# Patient Record
Sex: Male | Born: 1945 | Race: White | Hispanic: No | Marital: Married | Smoking: Never smoker
Health system: Southern US, Community
[De-identification: ages and names within clinical notes are randomized; demographics above are authoritative.]

## PROBLEM LIST (undated history)

## (undated) DIAGNOSIS — I2510 Atherosclerotic heart disease of native coronary artery without angina pectoris: Secondary | ICD-10-CM

## (undated) DIAGNOSIS — E119 Type 2 diabetes mellitus without complications: Secondary | ICD-10-CM

## (undated) DIAGNOSIS — I639 Cerebral infarction, unspecified: Secondary | ICD-10-CM

## (undated) DIAGNOSIS — N182 Chronic kidney disease, stage 2 (mild): Secondary | ICD-10-CM

## (undated) DIAGNOSIS — I2699 Other pulmonary embolism without acute cor pulmonale: Secondary | ICD-10-CM

## (undated) DIAGNOSIS — I219 Acute myocardial infarction, unspecified: Secondary | ICD-10-CM

## (undated) HISTORY — PX: OTHER SURGICAL HISTORY: SHX169

## (undated) HISTORY — PX: APPENDECTOMY: SHX54

## (undated) HISTORY — DX: Acute myocardial infarction, unspecified: I21.9

## (undated) HISTORY — DX: Chronic kidney disease, stage 2 (mild): N18.2

## (undated) HISTORY — DX: Cerebral infarction, unspecified: I63.9

## (undated) HISTORY — DX: Type 2 diabetes mellitus without complications: E11.9

## (undated) HISTORY — PX: CATARACT EXTRACTION: SUR2

## (undated) HISTORY — PX: CERVICAL SPINE SURGERY: SHX589

## (undated) HISTORY — DX: Other pulmonary embolism without acute cor pulmonale: I26.99

## (undated) HISTORY — DX: Atherosclerotic heart disease of native coronary artery without angina pectoris: I25.10

---

## 2002-01-07 ENCOUNTER — Ambulatory Visit: Admission: RE | Admit: 2002-01-07 | Discharge: 2002-01-07 | Payer: Self-pay | Admitting: Neurosurgery

## 2002-03-06 ENCOUNTER — Inpatient Hospital Stay (HOSPITAL_COMMUNITY)
Admission: RE | Admit: 2002-03-06 | Discharge: 2002-03-22 | Payer: Self-pay | Admitting: Physical Medicine & Rehabilitation

## 2002-03-12 ENCOUNTER — Encounter: Payer: Self-pay | Admitting: Physical Medicine & Rehabilitation

## 2002-03-22 ENCOUNTER — Inpatient Hospital Stay (HOSPITAL_COMMUNITY): Admission: AD | Admit: 2002-03-22 | Discharge: 2002-03-27 | Payer: Self-pay | Admitting: Cardiology

## 2002-03-23 ENCOUNTER — Encounter: Payer: Self-pay | Admitting: Cardiology

## 2002-04-06 ENCOUNTER — Inpatient Hospital Stay (HOSPITAL_COMMUNITY): Admission: EM | Admit: 2002-04-06 | Discharge: 2002-04-10 | Payer: Self-pay

## 2002-07-18 ENCOUNTER — Encounter: Payer: Self-pay | Admitting: Emergency Medicine

## 2002-07-18 ENCOUNTER — Emergency Department (HOSPITAL_COMMUNITY): Admission: EM | Admit: 2002-07-18 | Discharge: 2002-07-18 | Payer: Self-pay | Admitting: Internal Medicine

## 2002-08-28 ENCOUNTER — Encounter: Payer: Self-pay | Admitting: Neurosurgery

## 2002-09-02 ENCOUNTER — Encounter: Payer: Self-pay | Admitting: Neurosurgery

## 2002-09-02 ENCOUNTER — Inpatient Hospital Stay (HOSPITAL_COMMUNITY): Admission: RE | Admit: 2002-09-02 | Discharge: 2002-09-08 | Payer: Self-pay | Admitting: Neurosurgery

## 2002-09-05 ENCOUNTER — Encounter: Payer: Self-pay | Admitting: Neurosurgery

## 2002-09-26 ENCOUNTER — Encounter: Payer: Self-pay | Admitting: Emergency Medicine

## 2002-09-26 ENCOUNTER — Observation Stay (HOSPITAL_COMMUNITY): Admission: EM | Admit: 2002-09-26 | Discharge: 2002-09-27 | Payer: Self-pay | Admitting: Emergency Medicine

## 2002-11-16 ENCOUNTER — Encounter (HOSPITAL_COMMUNITY): Admission: RE | Admit: 2002-11-16 | Discharge: 2003-02-14 | Payer: Self-pay | Admitting: Cardiology

## 2003-02-15 ENCOUNTER — Encounter (HOSPITAL_COMMUNITY): Admission: RE | Admit: 2003-02-15 | Discharge: 2003-03-29 | Payer: Self-pay | Admitting: Cardiology

## 2003-12-21 ENCOUNTER — Ambulatory Visit: Payer: Self-pay | Admitting: Unknown Physician Specialty

## 2004-11-09 ENCOUNTER — Ambulatory Visit: Payer: Self-pay | Admitting: Cardiology

## 2005-11-13 ENCOUNTER — Ambulatory Visit: Payer: Self-pay | Admitting: Cardiology

## 2006-06-03 ENCOUNTER — Ambulatory Visit: Payer: Self-pay | Admitting: Cardiology

## 2006-06-03 ENCOUNTER — Inpatient Hospital Stay (HOSPITAL_COMMUNITY): Admission: EM | Admit: 2006-06-03 | Discharge: 2006-06-04 | Payer: Self-pay | Admitting: Emergency Medicine

## 2006-06-11 ENCOUNTER — Ambulatory Visit: Payer: Self-pay

## 2006-11-07 ENCOUNTER — Ambulatory Visit: Payer: Self-pay | Admitting: Cardiology

## 2008-03-01 ENCOUNTER — Ambulatory Visit: Payer: Self-pay | Admitting: Cardiology

## 2008-03-01 DIAGNOSIS — I259 Chronic ischemic heart disease, unspecified: Secondary | ICD-10-CM | POA: Insufficient documentation

## 2008-03-01 DIAGNOSIS — I2510 Atherosclerotic heart disease of native coronary artery without angina pectoris: Secondary | ICD-10-CM | POA: Insufficient documentation

## 2008-03-01 DIAGNOSIS — I6350 Cerebral infarction due to unspecified occlusion or stenosis of unspecified cerebral artery: Secondary | ICD-10-CM

## 2008-09-20 ENCOUNTER — Encounter (INDEPENDENT_AMBULATORY_CARE_PROVIDER_SITE_OTHER): Payer: Self-pay | Admitting: *Deleted

## 2008-12-21 ENCOUNTER — Ambulatory Visit: Payer: Self-pay | Admitting: Cardiology

## 2009-03-21 ENCOUNTER — Telehealth: Payer: Self-pay | Admitting: Cardiology

## 2010-03-28 NOTE — Progress Notes (Signed)
Summary: refill--metoprolol   Phone Note Refill Request Message from:  Patient on March 21, 2009 11:33 AM  Refills Requested: Medication #1:  METOPROLOL TARTRATE 50 MG TABS two times a day   [facility]ed[facility] [place] [phone]   Method Requested: Fax to Local Pharmacy Initial call taken by: Migdalia Dk,  March 21, 2009 11:34 AM  Follow-up for Phone Call        Rx sent into pharmacy. Pt notified Marrion Coy, CNA  March 21, 2009 11:49 AM  Follow-up by: Marrion Coy, CNA,  March 21, 2009 11:49 AM    Prescriptions: METOPROLOL TARTRATE 50 MG TABS (METOPROLOL TARTRATE) two times a day  #180 x 2   Entered by:   Marrion Coy, CNA   Authorized by:   [name], MD, [facility]   Signed by:   Marrion Coy, CNA on 03/21/2009   Method used:   [facility]        [id number]       [address]       [place], [place]  [address]       Ph: [phone] or [phone]       Fax: [phone]   RxID:   [id number]

## 2010-07-11 NOTE — Assessment & Plan Note (Signed)
[facility]                            CARDIOLOGY OFFICE NOTE   NAME:COXGriselda, Tosh                          MRN:          [id number]  DATE:11/07/2006                            DOB:          [birth year]    PRIMARY CARE PHYSICIAN:  Dr. Lorin Picket.   REASON FOR PRESENTATION:  Evaluate the patient with coronary disease.   HISTORY OF PRESENT ILLNESS:  The patient is a pleasant 65 year old  gentleman with a history of coronary disease as described below.  He was  admitted to [facility] earlier this year with a TIA.  Following this, he  did have an outpatient stress perfusion study.  An outpatient Cardiolite  was performed, and there was no evidence of ischemia or infarct.  His EF  was 58%.   The patient now returns and says he is doing very well.  He is  exercising 10 miles per day on a stationary bicycle.  He denies any  chest discomfort, neck or arm discomfort.  He has had no palpitation,  presyncope or syncope.  He has had no PND or orthopnea.   PAST MEDICAL HISTORY:  1. Coronary artery disease (status post stenting of the right coronary      artery x2 and PTCA of a PDA lesion in January 2004.  He had a      myocardial infarction when he was in rehabilitation following a      CVA).  2. History of right cerebrovascular accident, left hemiparesis.  3. Cataract surgery.  4. Appendectomy.  5. Cervical neck surgery.   ALLERGIES:  GLUCOPHAGE.   MEDICATIONS:  1. Altace 5 mg daily.  2. Metoprolol 50 mg b.i.d.  3. Levemir.  4. Aspirin 81 mg b.i.d.  5. Metformin 1000 mg b.i.d.  6. Simvastatin 40 mg daily.   REVIEW OF SYSTEMS:  As stated in the HPI and otherwise negative for  other systems.   PHYSICAL EXAMINATION:  The patient is in no distress.  Blood pressure 121/62, heart rate 66 and regular.  HEENT:  Eyes unremarkable.  Pupils equal, round, and react to light.  Fundi not visualized.  Oral mucosa unremarkable.  NECK:  No jugular venous distention  at 45 degrees, carotid upstroke  brisk and symmetric, no bruits, no thyromegaly.  LYMPHATICS:  No cervical, axillary, or inguinal adenopathy.  LUNGS:  Clear to auscultation bilaterally.  BACK:  No costovertebral angle tenderness.  CHEST:  Unremarkable.  HEART:  PMI not displaced or sustained.  S1 and S2 within normal limits.  No S3, no S4, no clicks, rubs, murmurs.  ABDOMEN:  Flat, positive bowel sounds, normal in frequency and pitch.  No bruits, rebound, guarding.  Midline pulse, no mass, no hepatomegaly  or splenomegaly.  SKIN:  No rashes, no nodules.  EXTREMITIES:  2+ pulses, no edema.  NEUROLOGIC:  Oriented to person, place, and time.  Cranial nerves  grossly intact.  Motor is grossly intact throughout.   EKG:  Sinus rhythm, rate 66, axis within normal limits, intervals within  normal limits, early transition, early repolarization pattern.   ASSESSMENT/PLAN:  1.  Coronary disease.  The patient is having no new symptoms related to      this.  No further cardiovascular testing is suggested.  He will      continue his secondary risk reduction.  2. Hypertension.  His blood pressure is well controlled.  He will      continue the medications as listed.  3. Dyslipidemia per Dr. Micah Noel.  The goal would be an LDL of less than      70 and an HDL in the 40s.  I will defer to his management.  4. Followup.  I can see him back in about 18 months or so in our      [place] clinic.     [name], MD, [facility]  Electronically Signed    JH/MedQ  DD: 11/07/2006  DT: 11/08/2006  Job #: [id number]   cc:   Dr. Lorin Picket

## 2010-07-11 NOTE — Assessment & Plan Note (Signed)
[facility]                            CARDIOLOGY OFFICE NOTE   NAME:Grant Sparks, Grant Sparks                          MRN:          [id number]  DATE:03/01/2008                            DOB:          04/29/1945    PRIMARY CARE PHYSICIAN:  Dr. Lorin Picket.   REASON FOR PRESENTATION:  Evaluate the patient with coronary artery  disease.   HISTORY OF PRESENT ILLNESS:  The patient is now 65 years old.  He  presents for followup of the above.  He has done very well since I last  saw him.  He has got no chest discomfort, neck or arm discomfort.  He  has had no palpitations, presyncope or syncope.  He denies any PND or  orthopnea.  He exercises by riding a bicycle 10 miles 6 days a week!  His lipids are followed by Dr. Micah Noel.   The patient did have a stress perfusion study last year, which  demonstrated no evidence of ischemia.  There was mild inferolateral  thinning at the base.  His EF was 58%.  This was done to evaluate  atypical chest pain.   PAST MEDICAL HISTORY:  Coronary artery disease (status post stenting of  the right coronary artery x2 and PTCA of a PDA lesion in January 2004.  He had a myocardial infarction when he was in rehabilitation following a  CVA), history of right cerebrovascular accident, left hemiparesis,  cataract surgery, appendectomy, and cervical neck surgery.   ALLERGIES:  GLUCOPHAGE.   MEDICATIONS:  1. Altace 5 mg daily.  2. Metoprolol 50 mg b.i.d.  3. Levemir.  4. Aspirin 81 mg b.i.d.  5. Simvastatin 40 mg daily.  6. Metformin 1000 mg q.a.m. and 1500 mg q.p.m.   REVIEW OF SYSTEMS:  As stated in the HPI and otherwise negative for  other systems.   PHYSICAL EXAMINATION:  GENERAL:  The patient is in no distress.  VITAL SIGNS:  Blood pressure 155/74, heart rate 65 and regular.  HEENT:  Eyelids unremarkable; pupils equal, round and reactive to light;  fundi not visualized; oral mucosa unremarkable.  NECK:  No jugular venous  distension at 45 degrees; carotid upstroke  brisk and symmetric; no bruits, no thyromegaly.  LYMPHATICS:  No cervical, axillary, or inguinal adenopathy.  LUNGS:  Clear to auscultation bilaterally.  BACK:  No costovertebral angle tenderness.  CHEST:  Unremarkable.  HEART:  PMI not displaced or sustained; S1 and S2 within normal limits;  no S3, no S4; no clicks, no rubs, no murmurs.  ABDOMEN:  Flat; positive bowel sounds, normal in frequency and pitch; no  bruits, no rebound, no guarding; no midline pulsatile mass; no  hepatomegaly, no splenomegaly.  SKIN:  No rashes, no nodules.  NEUROLOGIC:  Oriented to person, place, and time; cranial nerves II  through XII grossly intact; motor grossly intact.   EKG; sinus rhythm, right bundle-branch block, axis within normal limits,  intervals within normal limits, no acute ST-T wave changes.   ASSESSMENT AND PLAN:  1. Coronary artery disease.  The patient is doing very well.  He is  having no chest discomfort.  He is exercising routinely.  He is      following secondary risk reduction.  At this point, no further      cardiovascular testing is suggested, and he will continue with the      meds as listed.  2. Dyslipidemia.  Deferred to Dr. Micah Noel.  The suggestion is an LDL      less than 70 and HDL greater than 40, and I have discussed this      with the patient.  3. Hypertension.  Blood pressure is slightly elevated today.  However,      it is not typically elevated.  We will keep an eye on this with the      home blood pressure cuff.  Adjustments to his meds can be made      based on these data.  The goal would be blood pressure in the      120s/70s with his diabetes.  4. Status post cerebrovascular accident.  He has had no further      events.  5. Followup.  I will see him back in about 1 year or sooner if needed.     [name], MD, [facility]  Electronically Signed    JH/MedQ  DD: 03/01/2008  DT: 03/02/2008  Job #: [id number]   cc:    Dr. Lorin Picket

## 2010-07-14 NOTE — Cardiovascular Report (Signed)
NAMEHYMAN, CROSSAN NO.:  [id number]   MEDICAL RECORD NO.:  [id number]                   PATIENT TYPE:  INP   LOCATION:  2925                                 FACILITY:  [facility]   PHYSICIAN:  Veneda Melter, M.D. [facility]               DATE OF BIRTH:  [birth year]   DATE OF PROCEDURE:  03/23/2002  DATE OF DISCHARGE:                              CARDIAC CATHETERIZATION   PROCEDURES:  1. Left heart catheterization.  2. Left ventriculogram.  3. Selective coronary angiography.  4. Percutaneous transluminal coronary angioplasty and stent placement of mid-     right coronary artery.  5. Percutaneous transluminal coronary angioplasty of the posterior     descending artery.  6. Perclose right femoral artery.   DIAGNOSES:  1. Two-vessel coronary artery disease.  2. Normal left ventricular systolic function.  3. Non-Q-wave myocardial infarction.   HISTORY:  The patient is a 65 year old gentleman with hypertension,  dyslipidemia, and diabetes mellitus, who presents with substernal chest  discomfort.  The patient was on the rehab unit after suffering a stroke  attributable to small-vessel intracerebral disease.  He has been doing well  in regard to this.  Unfortunately, he developed severe substernal chest  discomfort with radiation to the back requiring anticoagulation and  nitrates.  ECG showed nonspecific changes and subsequently cardiac enzymes  were positive for a non-Q-wave myocardial infarction.  He presents for  further assessment.   DESCRIPTION OF PROCEDURE:  Informed consent was obtained.  The patient was  brought to the catheterization lab, a 6 French sheath placed in the right  femoral artery using the modified Seldinger technique.  Six [place] and  JR4 catheters then used to engage the left and right coronary arteries.  Selective angiography performed in various projections using manual  injections of contrast.  A 6 French pigtail catheter was then  advanced to  the left ventricle and a left ventriculogram performed using power injection  of contrast.   </INITIAL FINDINGS>  1. Left main trunk a large-caliber vessel with mild irregularities.  2. The LAD begins as a large-caliber vessel that tapers significantly as it     approaches the apex.  The LAD provides a small first diagonal branch in     the proximal segment with a larger second diagonal branch in the     midsection and several trivial diagonal branches distally.  There is mild     disease of 30% in the proximal to mid-LAD.  There is severe disease of 70-     90% in the distal section prior to the apex.  The first diagonal branch     is subtotally occluded proximally and exhibits TIMI grade 1 flow.  The     second diagonal branch has narrowings of 70% at the ostium and in the     proximal segment.  3. Left circumflex artery:  The  AV circumflex is a medium-caliber vessel     that provides three marginal branches.  There is mild disease in the     proximal AV circumflex of 50%.  The first marginal branch is of small     caliber and has a moderate narrowing of 70% in the distal second.  The     second and third marginal branches have mild disease of 30%.  4. Right coronary artery:  This is dominant, is a large-caliber vessel.  It     provides a posterior descending artery and three posterior ventricular     branches in the terminal segment.  The right coronary artery has a severe     subtotal narrowing of 99% in the midsection.  The posterior descending     artery has severe narrowings of 90-95% in the proximal segment.  The     posterior ventricular branches have mild irregularities.  There is TIMI 2     flow in the right coronary artery.  5. Left ventricle:  Normal end-systolic and end-diastolic dimensions.     Overall left ventricular function is well-preserved with an ejection     fraction of greater than 55%.  There is severe hypokinesis of the basilar     and mid-inferior  wall.  No mitral regurgitation is noted.  LV pressure is     100/10, aortic is 100/70, LVEDP was 15.   These findings were reviewed, and we elected to proceed with percutaneous  intervention of the right coronary artery.  The patient had been pretreated  with aspirin.  He was given 300 mg of Plavix at the termination of the case  and given Angiomax as the initial anticoagulant.  During the procedure the  patient was started on Integrilin for sluggish distal flow.  A 6 [place]  guide catheter with side holes was used to engage the right coronary artery  and a 0.014 inch extra-support wire advanced into the distal RCA.  A 3.0 x  20 mm Maverick balloon was introduced and used to pre-dilate the lesion at 8  atmospheres for 45 seconds.  Repeat angiography showed improvement in vessel  lumen; however, as noted there was persistence of the sluggish flow in the  distal vessel.  A 3.5 x 24 mm Express II stent was introduced, carefully  positioned in the mid-RCA at the site of severe stenosis, and deployed at 12  atmospheres for 30 seconds.  Repeat angiography showed improvement in vessel  lumen; however, distal flow was severely compromised.  Copious nitroglycerin  and verapamil were administered with improvement in flow.  There was  residual narrowing of at least 40% in the midsection of the stent, and we  elected to post-dilate the stent with a 3.75 x 15 mm __________ balloon.  Two inflations were performed in the proximal and midsections of the stent  at 12 atmospheres for 30 seconds and a single inflation in the midsection at  16 atmospheres for 30 seconds.  There was residual disease at approximately  40% distal to the stent, which I felt was vasospasm; however, despite  aggressive nitroglycerin and verapamil was persistent.  I elected to cover  this with an additional stent.  A 4.0 x 16 mm Express II stent was introduced, positioned distal to the previously-placed stent, and deployed  at  12 atmospheres for 30 seconds.  The stent delivery system was retracted,  used to post-dilate the junction of the two stents at 14 atmospheres for 30  seconds,  the proximal segment of the previously-placed stent at 14  atmospheres for 30 seconds.  Repeat angiography showed persistence of  moderate defect of 30% in the midsection of the first stent, and repeat  inflation was performed of the stent delivery system at 20 atmospheres for  60 seconds.  In the LAO projections there was no residual disease; however,  in the RAO projections it was felt that a residual narrowing of 30% was  noted in the midsection of the vessel.  This was deemed an acceptable  result.  The patient had persistence of chest discomfort despite copious  nitroglycerin and verapamil and improvement of distal flow.  We thus elected  to treat the disease in the proximal segment of the posterior descending  artery.  The wire was positioned in the vessel and a 2.5 x 20 mm Maverick  balloon was introduced.  Two inflations were performed in the proximal and  ostial segments of the PDA at 6 atmospheres for 120 seconds each.  Repeat  angiography showed significant improvement in vessel lumen and flow from  TIMI grade 2 to TIMI grade 3.  There was evidence of a dissection cap in the  midsection of the vessel; however, this appeared stable.  We thus elected to  leave the vessel alone.  Repeat angiography was performed showing TIMI 3  flow in the distal RCA and the patient had substantial improvement in  symptoms.  The ST elevation during balloon dilatation and stent deployment  had resolved at the end of the case.  The guide catheter was then removed  and the sheath removed as well.  A Perclose suture closure device was  deployed in the right femoral artery.  Adequate hemostasis was achieved and  the patient transferred to the holding room in stable condition.  He  tolerated the procedure well.   FINAL RESULT:  1. Successful  percutaneous transluminal coronary angioplasty and stent     placement in the mid-right coronary artery with reduction of 99%     narrowing to 30% with placement of a 3.5 x 24 mm Express II stent,     followed by a 4.0 x 16 mm Express II stent, both dilated to 4 mm.  2. Successful percutaneous transluminal coronary angioplasty of the proximal     segment of the posterior descending artery of the right coronary artery     with reduction of severe 90-95% narrowing to 40% using a 2.5 mm balloon.   ASSESSMENT AND PLAN:  The patient is a 65 year old gentleman with advanced  coronary artery disease, who has suffered a non-Q-wave myocardial infarction  of the inferior wall, who has undergone percutaneous intervention for  stabilization of subtotal right coronary lesion.  It was similarly elected  to dilate the proximal segment of the posterior descending artery to stabilize his pain; however, due to the small caliber of the vessel we did  not place a stent.  Should the patient have restenosis, consideration may be  given toward placement of a drug-eluting stent.  He has residual disease in  a small first diagonal branch and a medium-caliber second diagonal branch  that will be treated medically.  Should the patient have symptoms,  percutaneous therapy may be considered.  Aggressive risk factor modification  will also be pursued during hospitalization.  The patient will be continued  on Angiomax for an additional four hours and Integrilin for 48 hours for his  infarction and slow egress of flow through the distal vasculature,  suggesting  a thromboembolic phenomenon.                                                Veneda Melter, M.D. [facility]    NG/MEDQ  D:  03/23/2002  T:  03/23/2002  Job:  [id number]   cc:   Georgiana Spinner, M.D.  [address]  [place]  [place] [address]  Fax: [phone]   Harold Hedge, M.D.   Garnet Koyanagi, M.D.   [name], M.D. [facility]  [address]  [place]   [place] [address]  Fax: [phone]

## 2010-07-14 NOTE — H&P (Signed)
NAMEMACRAE, WIEGMAN NO.:  [id number]   MEDICAL RECORD NO.:  [id number]                   PATIENT TYPE:  INP   LOCATION:  3172                                 FACILITY:  [facility]   PHYSICIAN:  Hilda Lias, M.D.                DATE OF BIRTH:  [birth year]   DATE OF ADMISSION:  09/02/2002  DATE OF DISCHARGE:                                HISTORY & PHYSICAL   [name] is a gentleman who had been seen in my office on several occasions.  This gentleman in November 2003 came to my office complaining of neck pain,  numbness and weakness in the upper extremities, and problems with  incoordination.  The patient had an x-ray which showed that indeed he has a  cervical stenosis at the level of 4-5, 5-6, and 6-7, and the nerve  conduction tests showed that he has also bilateral carpal tunnel syndrome.  The patient was scheduled to have surgery, but during the workup, we found  that his shoulder was absolutely abnormal.  Because of that, he was sent to  his medical doctor for investigation of diabetes, and he ended up having  stroke on the right side which affected the left arm and also ended up  having two myocardial infarctions.   The patient underwent cardiac stents, and he had rehabilitation at [facility].  He did improve, but he continued to have weakness in the  left arm and some pain in the shoulder.  He did have some weakness in the  left side which seems to be residual from the stroke with some tenderness in  the neck and also in the lower area.  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  Now, because of incoordination, the medical doctor  felt that he should go ahead with the decompression.   PAST MEDICAL HISTORY:  1. Appendectomy.  2. Cardiac surgery with cardiac stents.  3. MI.  4. CVA.   MEDICATIONS:  He is taking medication for his high cholesterol and also for  diabetes, blood pressure, and also taking aspirin.   SOCIAL HISTORY:  Negative.   FAMILY HISTORY:  Unremarkable.   REVIEW OF SYSTEMS:  Positive for diabetes, MI, and frozen shoulder.   PHYSICAL EXAMINATION:  HEENT:  Normal.  NECK:  He is able to flex, but extension produces discomfort.  LUNGS:  Clear.  HEART: Sound normal.  ABDOMEN: Normal.  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  NEUROLOGIC:  Mental status normal.  Cranial nerves normal.  Strength: He has  weakness in the left side.  He has hyperreflexia with left Babinski.  Sensation: He complains of numbness in both hands.  Coordination: He is  walking with a limp on the left side.  Tried to stand and both feet  difficult for him.   LABORATORY DATA:  The MRI showed  that he has a severe case of cervical  stenosis at the level of 4-5, 5-6, and 6-7.   CLINICAL IMPRESSION:  1. Cervical stenosis with cervical myelopathy.  2. Status post cerebrovascular accident on the right side with left-sided     weakness.  3. Diabetes mellitus.   RECOMMENDATIONS:  The patient is being admitted for cervical diskectomy.  He  has been cleared completely by his medical doctor as well as by his  cardiologist.  The surgery was fully explained to him and his wife.  The  risks of course are infection, CSF leak, worsening of the weakness, stroke,  and all the risks associated with his diabetes, high blood pressure, and  cardiac history.                                               Hilda Lias, M.D.    EB/MEDQ  D:  09/02/2002  T:  09/02/2002  Job:  [id number]

## 2010-07-14 NOTE — H&P (Signed)
NAMEKENNETT, SYMES NO.:  [id number]   MEDICAL RECORD NO.:  [id number]          PATIENT TYPE:  EMS   LOCATION:  MAJO                         FACILITY:  [facility]   PHYSICIAN:  Genene Churn. Love, M.D.    DATE OF BIRTH:  1945-06-24   DATE OF ADMISSION:  06/03/2006  DATE OF DISCHARGE:                              HISTORY & PHYSICAL   This is one of several [facility] admissions for this 65-year-  old right-handed white married male from [place], [place], admitted  from the emergency room for evaluation speech disturbance, left-sided  numbness and chest pain.   HISTORY OF PRESENT ILLNESS:  Mr. Bertram was found have diabetes mellitus  and hypertension in November 2003 and after that time in February 27, 2002, developed a right brain stroke and was found to have a myocardial  infarction that was non-Q-wave.  He was transferred from [facility] to [facility]  He had mild residual weakness, was  evaluated by cardiology, and was in rehab and then developed chest pain  again and this time had increased cardiac isoenzymes for a second  subendocardial myocardial infarction.  He was catheterized and had a 99%  right mid RCA lesion as well as a 90% PDA lesion.  He underwent stent  with non DE stent to the mid right CA lesion and PTA dilatation to the  PDA.  He was discharged on Coumadin, Plavix and aspirin but was  readmitted April 06, 2002, for chest pain without MI.  He also  subsequently in August 28, 2002, underwent C4-5, C5-6, C6-7 anterior  diskectomy and internal fusion from C4 through C7 for a cervical  myelopathy by Dr. Hilda Lias.  He had a readmission in August 2004  for an episode of hypoglycemia to the cardiology service.  This morning  he felt as if he was developing a low blood sugar while lying in bed.  He notes that he was cold and sweaty, he was getting pale and weak.  He  got up to go downstairs to check his blood sugar and had  problems  getting his words out.  He noted left-sided numbness involving his arm  and leg.  He had resolution of the symptoms in his left foot and leg but  continued to have some in his left hand arm.  His speech improved.  He  came the emergency room.  About 10 o'clock he began having chest pain.  He had normal EKG and point-of-care enzymes for cardiac disease.  His  symptoms cleared in the ER of numbness.   PAST MEDICAL HISTORY:  Significant for:  1. Diabetes mellitus diagnosed November 2003.  2. Hypertension November 2003.  3. 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  4. Right brain stroke January 2004.  5. Cervical diskectomy for cervical myelopathy July 2004.  6. Hyperlipidemia.  7. He status post cataract surgery bilaterally.   MEDICATIONS:  1. Metformin 1000 mg b.i.d.  2. Simvastatin 40 mg daily which was a change from Lipitor which he      was taking previously.  3.  Ramipril 5 mg daily.  4. Metoprolol 50 mg b.i.d.  5. Aspirin 81 mg two daily.  6. Insulin 10 units the morning and 26 units in the evening.  7. Nitroglycerin 0.4 mg p.r.n.   FAMILY HISTORY:  Reveals that his mother died at 35 of myocardial  infarction.  His father died at 34 from myocardial infarction.  He has  one brother who died at 1 from a blood clot, another brother died in a  motor vehicle accident, another brother died from suicide, another  brother who died in an accident at work.  He has brothers 65, 55, 20, 47  and 63 living and well.  He has sisters 93 and 45 living well.  His two  daughters 45 and 69 living and well.   SOCIAL HISTORY:  He finished college.  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  He does not smoke cigarettes.  He does not drink alcohol.   EXAMINATION:  GENERAL:  Well-developed white male.  VITAL SIGNS:  Blood pressure right arm 160/80, left arm 180/80, heart  rate 64 and regular.  NECK:  There were no carotid or supraclavicular bruits heard.  The neck  flexion/extension maneuvers reveal  restricted motion.  NEUROLOGIC:  Mental status:  He was alert and oriented x3.  He followed  one-, two-, and three-step commands.  He could repeat phrases and name  objects.  There was no evidence of an aphasia.  His cranial nerve  examination revealed visual fields to be full.  Both disks were seen and  flat.  The extraocular movements were full.  Corneals were present.  Facial sensation was equal.  There was no facial motor asymmetry.  Hearing was present.  Air conduction was greater than bone conduction.  Tongue was midline.  The uvula was midline.  Gags were present.  Sternocleidomastoid and trapezius testing were normal.  Motor  examination increased tone in lower extremities, bilateral upgoing toes,  increased reflexes at the knees but otherwise his motor examination was  normal.  Finger-to-nose and heel-to-shin revealed poor heel-to-shin  bilaterally.  Sensory examination was intact to pinprick, touch, joint  position and vibration testing.  Deep tendon reflexes revealed increased  knee jerks and upgoing plantar responses.  HEENT:  Revealed his tympanic membranes were clear.  He is status post  lens surgery.  Tongue was midline.  He had false teeth.  LUNGS:  Clear.  HEART:  Revealed no murmurs.  BOWEL SOUNDS:  Normal.  GENITOURINARY:  He was circumcised.   IMPRESSION:  1. Right brain transient ischemic attack, code 435.0.  2. Cervical myelopathy, code 723.1.  3. Old right brain stroke, code 434.1.  4. Subendocardial myocardial infarctions x2 in January 2004, code      429.2.  5. Old posterior descending coronary artery percutaneous transluminal      coronary angioplasty and right coronary percutaneous transluminal      coronary angioplasty with right coronary stent, code 429.2.  6. Hyperlipidemia, code 272.4.  7. Diabetes mellitus, code 250.60.  8. Chest pain, code 429.2.  Plan at this time is to admit the patient, obtain cardiac isoenzymes,  obtain MRI study and MRA, have  the patient seen by cardiology.           ______________________________  Genene Churn. Sandria Manly, M.D.     JML/MEDQ  D:  06/03/2006  T:  06/03/2006  Job:  [id number]   cc:   [facility]  Angelique Holm, M.D.

## 2010-07-14 NOTE — Discharge Summary (Signed)
NAME:  Grant Sparks, Grant Sparks NO.:  [id number]   MEDICAL RECORD NO.:  [id number]                   PATIENT TYPE:  INP   LOCATION:  2019                                 FACILITY:  [facility]   PHYSICIAN:  Jesse Sans. Wall, M.D.                DATE OF BIRTH:  08/31/1945   DATE OF ADMISSION:  DATE OF DISCHARGE:  09/27/2002                           DISCHARGE SUMMARY - REFERRING   PROCEDURES:  None.   HISTORY OF PRESENT ILLNESS:  Grant Sparks is a 65 year old male with a history of  coronary artery disease. He woke up on the morning of admission with  dizziness and weakness and had chest pain which resolved spontaneously after  about 5 minutes with no medications. He was still in bed. He checked his  blood sugar and it was in the 70s. He drank orange juice and got up and ate  breakfast. After breakfast he walked down to the barn and he had sudden  onset of weakness, presyncope and diaphoresis as well as nausea but no  vomiting. He had no chest pain at that time.   He came to the emergency room and in the emergency room his CBG was 100. He  still complained of weakness but was otherwise  OK. He was discharged on  September 08, 2002, after having neck surgery, but has been getting gradually  stronger and this is his first episode of chest pain since discharge. He is  admitted for further evaluation and treatment.   HOSPITAL COURSE:  Grant Sparks had enzymes cycled and these were negative for MI.  He had a blood sugar of 89 at 1800 on the day of admission, but it was up to  2.3 before his Lantus was given at night. His range was cut back slightly  from 32 to 30 units, but his AMPBG was slightly elevated at 129. The  situation was discussed with the patient. He was advised that he needs to  eat regular meals, and that if he had an episode of low blood sugars that  once he had eaten, he needed to rest for a while to make sure that it was  within normal limits before he exerted himself.  He was also advised that if  he has any further episodes of chest pain he should contact us. He is  considered stable for discharge on September 27, 2002, and is to get an  outpatient stress test and to follow up with Dr. Mikey Bussing.   LABORATORY DATA:  Sodium 139, potassium 4.1, chloride 106, BUN 12,  creatinine 0.8. INR at discharge was 0.7. Hemoglobin 10.2, hematocrit 35.6,  WBC 8.5, platelets 224. Serial CK-MB and troponins were negative for MI.   CONDITION ON DISCHARGE:  Stable.   DISCHARGE DIAGNOSES:  1. Hypoglycemic episode.  2. Chest pain.  3. Status post recent cervical fusion.  4. History of myocardial infarction in January  2004.  __________ to the     right coronary artery and percutaneous transluminal coronary angioplasty     to the posterior descending artery.  5. Residual coronary artery disease with an ejection fraction of greater     than 55% by catheter.  6. Diabetes, hypertension and hyperlipidemia.  7. History of transient ischemic attack.  8. History of right cerebrovascular accident with left hemiparesis.  9. History of cervical degenerative disk disease with radiculopathy.  10.      History of nausea with high doses of Glucophage __________ .  11.      Status post cataract surgery and appendectomy.  12.      Family history of coronary artery disease.   DISCHARGE INSTRUCTIONS:  His activity level is not restricted. He is to  continue a diabetic diet. He is to continue his home medications.  He is to  continue to follow his blood sugar closely.   FOLLOW UP:  He is to follow up in the office.      Lavella Hammock, P.A. [facility]                  Thomas C. Wall, M.D.    RG/MEDQ  D:  09/27/2002  T:  09/27/2002  Job:  [id number]   cc:   Mikey Bussing, MD   Jennette Kettle, M.D.

## 2010-07-14 NOTE — Discharge Summary (Signed)
NAME:  Grant Sparks, Grant Sparks                             ACCOUNT NO.:  [id number]   MEDICAL RECORD NO.:  [id number]                   PATIENT TYPE:  INP   LOCATION:  4733                                 FACILITY:  [facility]   PHYSICIAN:  Jesse Sans. Wall, M.D. [facility]            DATE OF BIRTH:  [birth year]   DATE OF ADMISSION:  04/06/2002  DATE OF DISCHARGE:  04/10/2002                           DISCHARGE SUMMARY - REFERRING   PROCEDURES:  Coronary angiogram February 11.   REASON FOR ADMISSION:  Please refer to dictated admission note.   LABORATORY DATA:  Cardiac enzymes:  CPK-MB and troponin markers normal (x  3).  Normal electrolytes and renal function.  Decreased albumin 3.0, normal  liver enzymes.  INR 2.2 on admission, 1.1 at discharge.  WBC 5.9, hemoglobin  13.2, hematocrit 38.6, MCV 86. Platelets 315 on admission.   Admission chest x-ray:  Mild bilateral basilar atelectasis.   HOSPITAL COURSE:  The patient was admitted for further evaluation and  management of recurrent chest discomfort.  He had undergone recent stenting  of proximal RCA and PTCA of PDA following presentation with myocardial  infarction this past January.  He has a history of stroke and is on chronic  Commadin and was recently discharged with Lovenox overlap.   Coumadin was placed on hold, and the patient was placed on intravenous  heparin overlap during his stay.   Serial cardiac enzymes were all within normal limits.   Once subtherapeutic INR was achieved, the patient was referred for renal  coronary angiography.  Procedure was performed by Dr. Eden Emms on February 11  (see catheterization report for full details) , revealing no flow-limiting  lesions and no significant change in the diagonal lesion that was recently  noted.  Dr. Eden Emms noted the patient did report some chest pain during the  procedure.  However, there were no changes on the monitor, and he noted  normal TIMI flow.   Continued medical therapy was  recommended.  Coumadin was started the  following morning.  The patient had an INR level of 1.1 at time of  discharge.  He will once again be placed on Lovenox overlap as an  outpatient.   The patient has expressed desire to follow up with Dr. [name], and  arrangements will be made for followup in the [facility] office.  Dr.  Antoine Poche also recommends referring the patient for a GI evaluation for  further evaluation of non-cardiac chest pain.   DISCHARGE MEDICATIONS:  1. Lovenox 75 mg q.12h. (x 4 days).  2. Coumadin 7.5 mg daily.  3. Plavix 75 mg daily (as previously directed).  4. Aspirin 81 mg daily.  5. Protonix 40 mg daily.  6. Lopressor 50 mg b.i.d.  7. Lipitor 10 mg daily.  8. Altace 2.5 mg daily.  9. Avandamet 500/2 mg b.i.d.  10.      Lantus 22 units q.h.s.  11.  Paxil 20 mg daily.  12.      Vicodin 1 to 2 tablets q.6h. p.r.n.  13.      Nitrostat 0.4 mg p.r.n.   DISCHARGE INSTRUCTIONS:  1. No heavy lifting/driving x 2 days.  2. Low-fat, low-cholesterol, diabetic diet.  3. Call the office if there is any swelling or bleeding of the groin.   FOLLOW UP:  The patient is scheduled to follow up with Dr. [name] on  Thursday, March 18,  at 2 p.m.   The patient is scheduled to be seen by Dr. Melvia Heaps, [facility], on Thursday, February 26, at 10:30 a.m.   The patient is advised to follow up with his p primary care physician, Dr.  Lorin Picket, this coming Monday, February 16, for followup pro time and  Coumadin management.   The patient is also to call [facility] for outpatient  physical and occupational therapy.   DISCHARGE DIAGNOSES:  1. Recurrent, noncardiac chest pain.     a. Normal serial cardiac enzymes.     b. Nonobstructive coronary artery disease, cardiac catheterization        February 11.     c. Status post myocardial infarction with stent to proximal right        coronary artery and percutaneous  transluminal coronary angioplasty on        posterior descending artery, January 2004.     d. Normal left ventricular function.  2. Status post stroke on chronic Coumadin.  3. Type 2 diabetes mellitus.  4. Hypertension.  5. Dyslipidemia.     Gene Serpe, P.A. [facility]                      Thomas C. Daleen Squibb, M.D. [facility]    GS/MEDQ  D:  04/10/2002  T:  04/10/2002  Job:  [id number]   cc:   Lorin Picket, M.D.  [place], [place]

## 2010-07-14 NOTE — Op Note (Signed)
NAMEEURAL, HOLZSCHUH NO.:  [id number]   MEDICAL RECORD NO.:  [id number]                   PATIENT TYPE:  INP   LOCATION:  3172                                 FACILITY:  [facility]   PHYSICIAN:  Hilda Lias, M.D.                DATE OF BIRTH:  [birth year]   DATE OF PROCEDURE:  09/02/2002  DATE OF DISCHARGE:                                 OPERATIVE REPORT   PREOPERATIVE DIAGNOSIS:  Cervical stenosis, C4-5, C5-6 and C6-7 with  cervical myelopathy.   POSTOPERATIVE DIAGNOSIS:  Cervical stenosis, C4-5, C5-6, and C6-7 with  cervical myelopathy.   PROCEDURE:  Anterior C4-5, C5-6 and C6-7 diskectomy, decompression of the  spinal cord, and interbody bone graft fusion __________ from C4 to C7,  microscopy.   SURGEON:  Hilda Lias, M.D.   ASSISTANT:  Coletta Memos, M.D.   CLINICAL HISTORY:  The patient back in November of last year was going to be  admitted because of cervical myelopathy.  Unfortunately, we found that he  has an acute case of diabetes mellitus and since then he ended up having a  stroke plus stents in his heart.  Now, he is coming to have his surgery.  He  has been cleared by the cardiologist.  The risks were explained during the  history and physical.   DESCRIPTION OF PROCEDURE:  The patient was taken to the operating room and  after intubation the neck was prepped with Betadine.  Longitudinal incision  was made through the skin.  Muscle was dissected, and we went straight down  to the cervical spine.  X-rays revealed that we were at C4-5.  The patient  had a like osteophyte at the distal level and it was removed with the  rongeur.  With the microcurette as well as the drill, there was opening of  the space between 4-5 and 6 and 7.  We brought the microscope into the area.  Removal of degenerative disk was accomplished.  Then using the 1-2 mm  Kerrison punch, we opened the posterior ligament and we did decompression of  the spinal  cord as well as decompression of the foramen.  This procedure was  done at those three levels.  After having a good decompression, iliac crest  bone graft was inserted at a 10-mm height at those three levels.  Plates  from C4 down to C7 was inserted using six screws.  Because previously we  were unable to see only the top of C4, there was no need to do an x-ray.  Nevertheless, we knew that there was plenty of space between the posterior  aspect of the bone graft and the spinal cord.  Because the patient had been  on Coumadin and aspirin for at least six months, we used a drain for  drainage.  After having good decompression and hemostasis, the area was  irrigated and closed  with Vicryl and Steri-Strips.                                               Hilda Lias, M.D.    EB/MEDQ  D:  09/02/2002  T:  09/02/2002  Job:  [id number]

## 2010-07-14 NOTE — Assessment & Plan Note (Signed)
[facility]                              CARDIOLOGY OFFICE NOTE   NAME:Grant Sparks, Grant Sparks                          MRN:          [id number]  DATE:11/13/2005                            DOB:          [birth year]    PRIMARY CARE PHYSICIAN:  Dr. Lorin Picket.   REASON FOR PRESENTATION:  Evaluation of coronary disease.   HISTORY OF PRESENT ILLNESS:  Patient is a 65 year old gentleman who presents  for followup.  He has been doing well in the past year.  He has had no  recurrent chest discomfort, neck discomfort, arm discomfort, activity  induced nausea and vomiting, or excessive diaphoresis.  He has had no  palpitations, presyncope, or syncope.  He has no PND or orthopnea.  He is  not exercising as much as I would hope.  He has lost weight by dieting.   PAST MEDICAL HISTORY:  1. Coronary artery disease (status post stenting of a right coronary      artery x2 and PTCA of a PDA lesion in January, 2004.  He had a      myocardial infarction when he was in rehabilitation following a CVA).  2. History of right cerebrovascular accident.  3. Left hemiparesis.  4. Cataract surgery.  5. Appendectomy.  6. Cervical neck surgery.   ALLERGIES:  GLUCOPHAGE.   MEDICATIONS:  1. Altace 5 mg daily.  2. Lipitor 20 mg daily.  3. Metoprolol 50 mg b.i.d.  4. Levemir.  5. Aspirin 81 mg daily.  6. Metformin 1000 mg b.i.d.   REVIEW OF SYSTEMS:  As stated in the HPI, otherwise negative for other  systems.   PHYSICAL EXAMINATION:  GENERAL:  Patient is in no distress.  VITAL SIGNS:  Blood pressure 140/72, heart rate 59 and regular.  Weight 184  pounds.  Body Mass Index 25.  NECK:  No jugular venous distention.  Wave form within normal limits.  Carotid upstroke brisk and symmetric.  No bruits, no thyromegaly.  LYMPHATICS:  No cervical, axillary, or inguinal adenopathy.  LUNGS:  Clear to auscultation bilaterally.  BACK:  No costovertebral angle tenderness.  CHEST:   Unremarkable.  HEART:  PMI not displaced or sustained.  S1 and S2 within normal limits.  No  S3, no S4, no murmurs.  ABDOMEN:  Flat, positive bowel sounds.  Normal in frequency and pitch.  No  bruits, rebound, guarding.  There are no midline pulsatile masses.  No  organomegaly.  SKIN:  No rashes, nodules.  EXTREMITIES:  Pulses 2+.  No edema.   EKG:  Sinus rhythm, rate 60.  Axis within normal limits.  Intervals within  normal limits.  No acute ST-T wave changes.   ASSESSMENT/PLAN:  1. Coronary disease:  Patient is having no symptoms.  No further      cardiovascular testing is suggested.  I am going to suggest significant      participation in secondary risk reduction.  Towards that end, I have      suggested exercising five days a week, a half hour at a time.  We  discussed the specifics.  2. Dyslipidemia:  I did suggest he might be able to go to simvastatin and      pravastatin as a generic to help save money; however, I am not      following his lipids.  I have asked him to discuss this with Dr. Micah Noel.      I did tell them the goal of his LDL, given his diabetes, should be less      than 70 and his HDL in the 40s.  I will defer to Dr. Micah Noel.  3. Followup:  I will see him in 18 months or sooner if needed.                               [name], MD, [facility]    JH/MedQ  DD:  11/13/2005  DT:  11/14/2005  Job #:  [id number]   cc:   Lorin Picket

## 2010-07-14 NOTE — Discharge Summary (Signed)
   NAME:  Grant Sparks, Grant Sparks                             ACCOUNT NO.:  [id number]   MEDICAL RECORD NO.:  [id number]                   PATIENT TYPE:  INP   LOCATION:  2019                                 FACILITY:  [facility]   PHYSICIAN:  Gene Serpe, P.A. [facility]                DATE OF BIRTH:  26-Nov-1945   DATE OF ADMISSION:  03/22/2002  DATE OF DISCHARGE:                                 DISCHARGE SUMMARY   ADDENDUM:  Upon review of the patient's medications prior to admission, it  was found that he was on a combination of Avandia/metformin 4/500 mg b.i.d.  We will therefore resume this previous medication and not continue the  Avandia 6 mg daily and Amaryl 2 mg daily that the patient is currently on  here in the hospital.  Additional modifications are that the patient was on  Lipitor 10 mg daily at home and we will continue this as well.  We will not  continue the Claritin that he is currently on.  He apparently was started on  Paxil; however, at some point during these hospitalizations and, for the  time being, we will continue this and then defer further management to the  patient's primary care physician.                                               Gene Serpe, P.A. [facility]    GS/MEDQ  D:  03/27/2002  T:  03/27/2002  Job:  [id number]   cc:   Angelique Holm, M.D.   Harold Hedge, M.D.

## 2010-07-14 NOTE — Cardiovascular Report (Signed)
   NAMEMATEJ, SAPPENFIELD NO.:  [id number]   MEDICAL RECORD NO.:  [id number]                   PATIENT TYPE:  INP   LOCATION:  2903                                 FACILITY:  [facility]   PHYSICIAN:  Charlton Haws, M.D. [facility]              DATE OF BIRTH:  [birth year]   DATE OF PROCEDURE:  04/08/2002  DATE OF DISCHARGE:                              CARDIAC CATHETERIZATION   PROCEDURE:  Coronary arteriotomy.   INDICATION:  The patient is a 65 year old patient who has had a recent stent  placed to the mid right coronary artery.  He has recurrent chest pain.  Study was done to rule out restenosis.   DESCRIPTION OF PROCEDURE:  Cine catheterization was done from the right  femoral artery.   FINDINGS:  Left main coronary artery was normal.   Left anterior descending artery was normal in the proximal and mid-vessel.  The distal vessel was small and diffusely diseased with 60-70% disease.  First diagonal branch had a 60% discrete lesion which was unchanged from the  previous catheterization.   Circumflex coronary artery had 30-40% long tubular lesion proximally.   Right coronary artery was normal proximally.  The mid stent was widely  patent.  The PDA had 20-30% ostial disease.  PLA had a 40-50% lesion  distally.   RIGHT ANTERIOR OBLIQUE VENTRICULOGRAPHY:  RAO ventriculography was normal.  EF was 60%.  Aortic pressure was 112/62.  LV pressure was 112/16.  After RAO  ventriculography, the aortic root appeared to be only mildly dilated with no  evidence of dissection.   IMPRESSION:  The patient's chest pain currently would not appear to be  cardiac.  He had pain during the case with normal TIMI flow and no changes  on his monitor.   He has ruled out for a myocardial infarction and does not have any wall  motion abnormality.  He will be observed in the hospital while he is re-  anticoagulated with Coumadin.   The diagonal lesion may need to be intervened on in  the future but it is  unchanged from his catheterization on March 21, 2002 and I would not  intervene this early after having a stent placed to the right coronary  artery.   Again, the patient's pain is very disproportionate to any findings on his  angiogram.                                               Charlton Haws, M.D. [facility]    PN/MEDQ  D:  04/08/2002  T:  04/08/2002  Job:  [id number]

## 2010-07-14 NOTE — H&P (Signed)
NAME:  RASHEEN, BELLS NO.:  [id number]   MEDICAL RECORD NO.:  [id number]                   PATIENT TYPE:  INP   LOCATION:  1830                                 FACILITY:  [facility]   PHYSICIAN:  Thomas C. Wall, M.D.                DATE OF BIRTH:  08-08-1945   DATE OF ADMISSION:  09/26/2002  DATE OF DISCHARGE:                                HISTORY & PHYSICAL   Hilda Lias, M.D.   CHIEF COMPLAINT:  Sudden onset of weakness, feeling as if I was going to  faint and sweating.   HISTORY:  [name] is a 65 year old, married white male well-known to our  service with known CAD.  He awoke early this morning with a little bit of  chest discomfort.  It resolved spontaneously.  He checked his blood sugar,  and it was in the 70s.  He drank some orange juice and ate breakfast.  He  walked to the barn and had sudden onset of weakness, presyncope,  diaphoresis.  He had no chest pain at that time.  By the time he got to the  emergency room, his blood sugar was 100.  He just complains right now of  generalized weakness.   He recently had cervical surgery with Dr. Jeral Fruit about 3 weeks ago.  He has  been slowly getting stronger since then.   PAST MEDICAL HISTORY:  1. He has coronary artery disease.  2. He had 2 stents placed in his RCA and a PTCA to PDA in January of 2004.     He had other residual disease, including a 90% LAD that was distal just     prior to the apex, a 99% diagonal 1, 70% diagonal 2, 50% circumflex, 70%     OM-1.  3. He returned to the ER in February when I saw him.  He had chest pain     despite IV nitroglycerin.  [place]  catheterization showed stable     coronary anatomy, and he was treated medically.  He ruled out for an     infarction at that time.  4. He has had a history of a TIA with a right CVA with left hemiparesis in     January of 2004.  5. He had previous cataract surgery.  6. Appendectomy.   ALLERGIES:  He is intolerant of  GLUCOPHAGE and AMBIEN.   CURRENT MEDICATIONS:  1. Altace 5 mg a day.  2. Lipitor 10 mg a day.  3. Lantus 32 units a day.  4. Paxil 5 mg a day.  5. Aspirin daily.  6. Nitroglycerin.  7. Avandamet 4/500 twice a day.  8. Coumadin 5 mg a day.  9. Metoprolol 50 twice a day.   SOCIAL HISTORY:  He lives in [place] with his wife.  He is an Airline pilot.   FAMILY HISTORY:  Very positive for coronary disease.  He had a brother who  died of an MI at age 79.   REVIEW OF SYSTEMS:  Otherwise, unremarkable.   PHYSICAL EXAMINATION:  VITAL SIGNS:  Blood pressure today 118/68,  respiratory rate 16, pulse 65 and regular, temperature 97.8.  His O2 sat is  100% on room air.  GENERAL:  He is in no acute distress.  He is a well-developed, well-  nourished white male.  SKIN:  Warm and dry.  HEENT:  Unremarkable.  NECK:  Supple with good carotid upstrokes without bruits.  There is no  thyromegaly.  There is no JVD.  LUNGS:  Clear to auscultation and percussion.  HEART:  Regular rate and rhythm without murmur, rub or gallop.  ABDOMEN:  Soft with good bowel sounds.  There is no epigastric tenderness.  EXTREMITIES:  No cyanosis, clubbing or edema.  Pulses are intact.   LABORATORY DATA:  EKG showed sinus rhythm with some mild ST-segment  flattening inferolaterally but no change since previous ECGs.  His initial  POC enzymes are negative x2.  His INR is only 1.5 on Coumadin.  Hemoglobin  is 12.2.  glucose is 117.  Potassium is 4.7.   ASSESSMENT:  1. Episode of presyncope, weakness and diaphoresis.  I suspect he was     hypoglycemic, though, it was not documented.  2. Coronary artery disease.  This episode of mild chest discomfort this     morning has his wife quite worried, but I suspect it was not a coronary     ischemic event.  3. Anticoagulation for previous transient ischemic attack and stroke.  He is     currently at a subtherapeutic international normalized ratio.   PLAN:  1. Increase Coumadin  dose tonight and place on Lovenox overnight.  2. Serial cardiac enzymes.  3. Telemetry.  4. If  his cardiac enzymes are negative, we will discharge tomorrow.    ARRANGEMENTS:  1. An outpatient Cardiolite in our office.  2. Follow up with Dr. Antoine Poche as appropriate.                                                Thomas C. Wall, M.D.    TCW/MEDQ  D:  09/26/2002  T:  09/26/2002  Job:  [id number]   cc:   [place], [place] Fath   [name], M.D.

## 2010-07-14 NOTE — H&P (Signed)
[name], Sparks NO.:  [id number]   MEDICAL RECORD NO.:  [id number]                   PATIENT TYPE:  INP   LOCATION:  2925                                 FACILITY:  [facility]   PHYSICIAN:  [name], M.D. [facility]            DATE OF BIRTH:  1945-11-01   DATE OF ADMISSION:  03/22/2002  DATE OF DISCHARGE:                                HISTORY & PHYSICAL   PRIMARY CARE PHYSICIAN:  Lorin Picket, M.D. , [facility]   REASON FOR TRANSFER:  Evaluate patient with chest pain and elevated cardiac  enzymes.   HISTORY OF PRESENT ILLNESS:  The patient is a pleasant 65 year old gentleman  who was admitted to [facility] on February 27, 2002, with a  cerebrovascular accident. He was found to have a left basal ganglia CVA. He  did describe chest discomfort during that admission and was found to have a  non Q wave MI with elevated enzymes. He was seen by Dr. Lady Gary and managed  conservatively because of his recent CVA. He was transferred to [facility] on the 9th for cardiac rehabilitation. He has had residual left-  sided weakness and difficulties with speech but has done very well.   He reports (2 days prior to this event) he developed chest discomfort. It  went away and he was able to go home yesterday on a pass. He did well all of  yesterday until the ride back to the hospital when he  had some nausea. Last  night he had chest discomfort. It was 6/10 in intensity. It was substernal.  He had some left elbow pain. He had some improvement with nitrates and  apparent GI cocktail. However, this was transient, and he was in  predominantly in 6/10 chest pain throughout the evening.   This morning he was noted to have elevated cardiac enzymes as described  below. There were no acute EKG changes. He is now transferred to the CCU by  admission for our service. After sublingual nitroglycerin his pain is  currently 2/10.   Prior to  November he had been  well. At that time he had a TIA and was  diagnosed with diabetes and hypertension. He denies any symptoms prior to  this admission consistent with his current chest pain. He is not describing  any nausea or vomiting currently. He has no shortness of breath. He has not  described PND or orthopnea. He has had no palpitations, presyncope or  syncope.   PAST MEDICAL HISTORY:  1. Diabetes mellitus diagnosed in November.  2. Hypertension since November.  3. TIA in November 2003 with an MRI demonstrating small vessel disease.  4. Hyperlipidemia.  5. Degenerative joint disease.  6. Cataracts.   PAST SURGICAL HISTORY:  1. Cervical spine stenosis and radiculopathy.  2. Bilateral cataract surgery.  3. Appendectomy.   ALLERGIES:  None.   MEDICATIONS:  On  transfer:  1. Lipitor 10 mg q.h.s.  2. Lantus insulin 12 units q.h.s.  3. Aspirin 81 mg q.d.  4. Nitroglycerin paste.  5. Tylenol.  6. Coumadin 4 mg q.d.  7. Avandia 4 mg q.d.   SOCIAL HISTORY:  The patient lives in [place]. He has been married for 35  years. He  has 2 adult children and 3 grandchildren. He is a IT trainer. He does  not smoke cigarettes or drink alcohol.   FAMILY HISTORY:  Contributory for early coronary artery disease in his  brother and his father. His mother had later onset heart disease.   REVIEW OF SYSTEMS:  As stated in the HPI. Positive for arthralgias and  problems with his vision. Otherwise remarkable for residual deficits from  the stroke.   PHYSICAL EXAMINATION:  GENERAL:  The patient is in no distress.  VITAL SIGNS:  Blood pressure 118/60, heart rate 91 and regular.  HEENT:  Unremarkable. Pupils equally round and reactive to light. Fundi not  visualized. Oral mucosa unremarkable.  NECK:  No jugular venous  distention. Waveform within normal limits. Carotid  upstroke brisk and symmetric. No bruits or thyromegaly.  LYMPHATICS:  No cervical, axillary or inguinal adenopathy.  LUNGS:  Clear to auscultation  bilaterally.  BACK:  No costovertebral angle tenderness.  CHEST:  Unremarkable.  HEART:  PMI not displaced or  sustained. S1 and S2 within normal limits. No  S3, No S4. No murmurs.  ABDOMEN:  Flat, positive bowel sounds, normal in frequency, pitch, no  bruits, no rebound or guarding, no organomegaly, no pulsatile mass, no  hepatosplenomegaly.  SKIN:  No rash, no nodules.  EXTREMITIES:  2+ pulses throughout, no bruits, no cyanosis, clubbing or  edema.  NEUROLOGIC:  Oriented to person, place and time. Cranial nerves grossly  intact. The left upper extremity and lower extremity mild weakness.   LABORATORY DATA:  WBC 10.6, hemoglobin 14.4, platelets 303. Sodium 135,  potassium 4.3, BUN 20, creatinine 0.8, glucose 209. AST 13, ALT 19, alkaline  phosphatase 84. CK 153, MB 21.6, troponin 0.6.   ASSESSMENT AND PLAN:  1. Acute coronary syndrome. The patient is having  unstable angina with     positive enzymes. He will be placed in the intensive care unit on     Lovenox, IV nitroglycerin, aspirin and low dose beta blocker. He does     have a therapeutic INR and will have this reversed with p.o. vitamin K.     If his pain can be managed medically, we will electively perform a     catheterization when his INR is reversed. If not he will need fresh     frozen plasma and urgent cardiac catheterization.  2. Hyperlipidemia. He will continue on his Zocor.  3. Diabetes. He will continue on his p.o. medications plus sliding scale     insulin as needed.  4. Status post cerebrovascular accident. He will continue his rehabilitation     eventually and be resumed on Coumadin. We will investigate his labs from     the outside hospital and workup to understand the etiology and in     particular of whether there has been a prothrombic workup. We also     understand that he may have had an echo in [place] and will try to find    this. If not, one will be obtained.  5. Hypertension. Will be managed in the  context of treating his acute     coronary syndrome.  [name], M.D. [facility]    JH/MEDQ  D:  03/22/2002  T:  03/22/2002  Job:  [id number]   cc:   Erasmo Leventhal, M.D.  [address]  [place]  [place] [address]  Fax: [phone]   Lorin Picket, M.D.

## 2010-07-14 NOTE — Discharge Summary (Signed)
   NAMECLAYBURN, WEEKLY NO.:  [id number]   MEDICAL RECORD NO.:  [id number]                   PATIENT TYPE:  INP   LOCATION:  2019                                 FACILITY:  [facility]   PHYSICIAN:  [name], M.D. [facility]            DATE OF BIRTH:  December 26, 1945   DATE OF ADMISSION:  03/22/2002  DATE OF DISCHARGE:                                 DISCHARGE SUMMARY   ADDENDUM:  I requested copies to be sent to two doctors and I just want to  kind of reemphasize that and verify that.  There is a Dr. Angelique Holm and  apparently his address is [address] [place], [place], [address] and I also believe that I had mentioned to send it to Dr.  Harold Hedge of [place] as well.  His address is [address],  [place], [place], [address].     Gene Serpe, P.A. [facility]                      [name], M.D. [facility]    GS/MEDQ  D:  03/27/2002  T:  03/27/2002  Job:  [id number]   cc:   Angelique Holm, M.D.  [address]  [place], [place] [address]   Harold Hedge, M.D.  [address]  [place], [place] [address]

## 2010-07-14 NOTE — H&P (Signed)
NAME:  Grant Sparks, Grant Sparks NO.:  [id number]   MEDICAL RECORD NO.:  [id number]                   PATIENT TYPE:  INP   LOCATION:  1828                                 FACILITY:  [facility]   PHYSICIAN:  Thomas C. Wall, M.D. [facility]            DATE OF BIRTH:  [birth year]   DATE OF ADMISSION:  04/06/2002  DATE OF DISCHARGE:                                HISTORY & PHYSICAL   CHIEF COMPLAINT:  Chest tightness and pressure, breaking out in a sweat at  6:30 a.m.   HISTORY OF PRESENT ILLNESS:  [name] is a 65 year old married white male who  was hear after having a stroke and myocardial infarction in January.  Cardiac catheterization on 03/23/02 demonstrated a 99% diagonal 1 lesion, a  70% diagonal 2, 70-95% distal left arterial descending, 50% proximal  circumflex, 70% obtuse marginal 1, 99% high grade right coronary artery  proximally, and a 90% proximal posterior descending artery.  He underwent  intervention with his proximal right being reduced with stent and  percutaneous transluminal coronary angioplasty to 30%, and he had  percutaneous transluminal coronary angioplasty to the posterior descending  artery to 40%.  Ventriculogram showed an ejection fraction of 55%, severe  hypokinesis of the inferior wall, and no mitral regurgitation.  Medical  therapy was recommended for his diagonal and other disease.   He has been on Coumadin and Lovenox trying to reach a therapeutic  International Normalized Ratio.  Interestingly, today his International  Normalized Ratio is 2.2 with last Lovenox dose last night.   He awoke this morning at 6:30 with symptoms reminiscent of his previous  myocardial infarction. He came to the emergency room.  His electrocardiogram  shows ST segment flattening in 2, 3, and aVF with deep T wave inversion in  those leads.  It is deeper than his discharge electrocardiogram.   He is currently having about 3/10 chest pain.  We just started heparin,  and  upping his nitroglycerin.  He may need to go to the cath lab, which I have  discussed with the patient and family.   PAST MEDICAL HISTORY:  He has no known drug allergies.   MEDICATIONS PRIOR TO ADMISSION:  Paxil 20 mg a day, Lopressor 50 mg b.i.d.,  Protonix 40 mg a day, Plavix 75 mg  day, sublingual nitroglycerin p.r.n.,  Coumadin 5 alternating with 7.5 every other day, Lipitor 10 mg a day,  Lovenox 80 mg q.12h, Altace 2.5 mg a day, Avandamet 2/500 b.i.d., and Lantus  insulin 22 units a day.   CARDIAC RISK FACTORS:  1. Male sex.  2. Age.  3. Diabetes.  4. Hypertension.  5. Hyperlipidemia.  6. Family history.  His father died of a myocardial infarction at age 42.     His brother died of a myocardial infarction at age 33.   SOCIAL HISTORY:  He lives in [place] with his  wife.  He is an Airline pilot.   REVIEW OF SYSTEMS:  Other than the History of Present Illness, is really  noncontributory.   PHYSICAL EXAMINATION:  GENERAL:  His exam reveals him to be in no acute  distress.  VITAL SIGNS:  Blood pressure 125/70.  Pulse 68 and regular.  He is in sinus  rhythm.  Respiratory rate is 20.  Temperature:  He is afebrile.  Oxygen  saturation is adequate on oxygen.  SKIN:  Warm and dry.  HEENT:  Total unremarkable.  NECK:  No JVD.  Carotid upstrokes are equal bilaterally without bruits.  There is no thyroid enlargement.  CARDIOVASCULAR:  Normal S1 and S2, with an S4.  There is no gallop.  LUNGS:  Clear to auscultation and percussion.  ABDOMINAL:  Soft.  Active bowel sounds.  No hepatosplenomegaly.  There is no  midline bruit.  EXTREMITIES:  No clubbing, cyanosis, or edema.  Pulses are brisk  bilaterally.  His right groin is well-healed with no bruising and no bruit.  NEUROLOGIC:  Grossly intact.   LABORATORY DATA:  Chest x-ray shows bilateral basilar atelectasis with no  acute cardiopulmonary disease, otherwise.  Electrocardiogram as described in  History of Present Illness.   Laboratory data shows potassium of 4.1,  platelets of 315, hemoglobin 13.2, creatinine 0.8, sugar 148.  His first CPK-  MB and troponins were negative.   ASSESSMENT:  1. Unstable angina.  Rule out non-Q-wave infarction.  Suspect stenosis of     the right coronary artery, either proximally or distally.  I suspect this     is a posterior descending artery lesion based on the statistics.  2. History of anticoagulation.  Now therapeutic International Normalized     Ratio, secondary to cerebrovascular accident.  3. Diabetes mellitus.  4. Hyperlipidemia.  5. Hypertension.  6. Family history of coronary disease.   PLAN:  1. Admit to telemetry.  2. Serial enzymes.  3. IV nitroglycerin, titrate to pain.  4. Begin heparin to therapeutic partial thromboplastin time of 60 to 80.  5. Continue all other medications, except his metformin.  6. Elective catheterization tomorrow morning, unless his pain intensifies.     If it does, we will plan catheterization today.   The [facility] was notified.  Dr. Charlies Constable is on  standby in the Cath Lab.                                                Thomas C. Daleen Squibb, M.D. [facility]   TCW/MEDQ  D:  04/06/2002  T:  04/06/2002  Job:  [id number]   cc:   Veneda Melter, M.D. [facility]   Vidal Schwalbe, MD   Lorin Picket, MD

## 2010-07-14 NOTE — Discharge Summary (Signed)
NAMELORENZ, DONLEY NO.:  [id number]   MEDICAL RECORD NO.:  [id number]                   PATIENT TYPE:  INP   LOCATION:  2019                                 FACILITY:  [facility]   PHYSICIAN:  [name], M.D. [facility]            DATE OF BIRTH:  05-25-1945   DATE OF ADMISSION:  03/22/2002  DATE OF DISCHARGE:  03/27/2002                                 DISCHARGE SUMMARY   PROCEDURES:  Coronary angiogram/stent RCA and percutaneous transluminal  coronary angioplasty PDA March 23, 2002.   REASON FOR ADMISSION:  The patient is a 65 year old male, with no prior  known history of heart disease, who was initially hospitalized at [facility] on February 27, 2002, of this year with a left basal ganglia  nonhemorrhagic stroke.  He was subsequently transferred to the [facility] on March 06, 2002.  Of note, however, the  patient was felt to have had a non-Q-wave MI while at [facility] with  a recommendation to treat conservatively.  Following his transfer to [facility], he had recurrent worsening chest discomfort and  repeat cardiac enzymes were elevated with no acute changes by  electrocardiogram.  Recommendation was to proceed with aggressive medical  management and subsequent coronary angiography, once INR was reversed with  vitamin K.  Please refer to detailed admission note for full details.   LABORATORY DATA:  Cardiac enzymes:  Peak CPK 625/74 (10%), peak troponin I  9.03.  Lipid profile:  Total cholesterol 130, triglycerides 61, HDL 38, LDL  80 (cholesterol/HDL ratio 3.4).  TSH 1.1.  WBC 6.4, hemoglobin 13.2,  platelets 250 on admission.  Occult blood:  Negative.  INR 2.0 on admission.  Sodium 136, potassium 4.3, glucose 133, BUN 9, creatinine 0.7 on admission.  Normal liver enzymes.  Hemoglobin A1C 10.1.   Chest x-ray:  Mild, bibasilar atelectasis.   HOSPITAL COURSE:  The  patient was initially transferred from the inpatient  rehab unit to the intensive care unit by Dr. Antoine Poche, and placed on a  medication regimen including aspirin and intravenous nitroglycerin.  With an  INR of 2.0, the patient was treated with oral vitamin K which decreased the  INR to 1.6, allowing the patient to proceed with coronary angiography the  following day.   Cardiac catheterization, performed by Veneda Melter, M.D., and continued on a  medication regimen including aspirin, beta-blocker, and intravenous  nitroglycerin.  Given his therapeutic INR of 2.0, the patient received 2.5  mg of oral vitamin K for reversal giving a level of 1.6.  This cleared the  patient to proceed with cardiac catheterization the following day.   Coronary angiography, performed by Dr. Veneda Melter (see report for full  details), revealed 99% mid RCA  lesion as well as a 90% PDA lesion.  These  were both successfully dilated with placement  of a non-DS stent with a mid  lesion, with reduction to approximately 30%, and PTA dilatation of the 90%  PDA lesion to approximately 40% residual stenosis.  There were no noted  complications.   Residual anatomy notable for 30% proximal, 70-95% distal LAD, 99% DX1, 70%  OM2, 50% AV/circumflex.  Left ventriculogram showed preserved LV function  with severe inferolateral hypokinesis.   Dr. Chales Abrahams recommended medical management of the diagonal lesion and  continuation of Plavix for 30 days.   Postoperative course was notable for no recurrent chest discomfort. Serial  cardiac enzymes showed a downward trend and, in fact, were normalized at the  time of tentative discharge back to the inpatient rehab unit.   Medications were adjusted and up titrated as the patient tolerated.   Coumadin was resumed two days post intervention.  Therapy was managed per  pharmacy and the patient was placed on Lovenox overlap, due to  subtherapeutic INR of 1.2, at time of tentative  discharge.   Regarding anticoagulation, recommendation was to down titrate aspirin to 81  mg q.d. given the patient will be on Plavix for a month and on long-term  Coumadin.   The patient was assisted both by physical and occupational therapy during  his postoperative course.   At the time of scheduled consideration for transfer back to the inpatient  rehabilitation unit, the patient expressed a desire to be discharged home  instead.  Therefore, arrangements are being made for close outpatient follow-  up with the patient's cardiologist, Dr. Mariel Kansky in [place], [facility], as well as his primary care physician, Dr. Lorin Picket.  We will also  arrange for early protime follow-up and continued Coumadin anticoagulation  management.  The patient will be given prescription for Lovenox for overlap,  until his INR is therapeutic.   DISCHARGE MEDICATIONS:  1. Plavix 75 mg q.d. (x1 month).  2. Aspirin 81 mg q.d.  3. Coumadin 7.5 mg as directed.  4. Zocor 20 mg q.d.  5. Avandia 6 mg q.d.  6. Paxil 20 mg q.d.  7. Amaryl 2 mg q.d.  8. Claritin 10 mg q.d.  9. Protonix 40 mg q.d.  10.      Vasocon ophthalmic solution one GTT, OU, q.i.d.  11.      Lopressor 50 mg b.i.d.  12.      Lantus insulin 18 units q.h.s.  13.      Nitroglycerin 0.4 mg p.r.n.   DISCHARGE INSTRUCTIONS:  The patient is to refrain from any strenuous  activity or heavy lifting until seen by his physician.   The patient is to observe the groin sites for any increased swelling or  development of pain, and to contact our office.   Arrangements will be made for home physical and occupational therapy  assistance.  The patient will also have a nurse available to provide Lovenox  injection.   Arrangements will be made for the patient to follow up with Dr. Harold Hedge  and Dr. Lorin Picket, within the next few weeks.  We will also recommend an  early protime follow-up.   DISCHARGE DIAGNOSES: 1. Status post non-ST  elevation myocardial infarction.     a. Peak CPK 65/64, troponin I 9.0.     b. Status post stent (non-DS) proximal right coronary artery and        percutaneous transluminal coronary angioplasty posterior descending        coronary artery.     c. Residual moderate distal left  anterior descending and high grade        diagonal disease.     d. Preserved left ventricular function.  2. Status post nonhemorrhagic stroke.     a. Prior to myocardial infarction.     b. No significant internal carotid artery stenosis.     c. Coumadin anticoagulation/Lovenox overlap.  3. Insulin-dependent diabetes mellitus.  4. Hypertension.  5. Dyslipidemia.     Gene Serpe, P.A. [facility]                      [name], M.D. [facility]    GS/MEDQ  D:  03/27/2002  T:  03/27/2002  Job:  [id number]   cc:   Erasmo Leventhal, M.D.  [address]  [place]  [place] [address]  Fax: [phone]   Harold Hedge, M.D.  [address]  [place], [place]   Angelique Holm, M.D.  [address]  [place], [place] [address]

## 2010-07-14 NOTE — Discharge Summary (Signed)
   Grant Sparks, BRASS NO.:  [id number]   MEDICAL RECORD NO.:  [id number]                   PATIENT TYPE:  INP   LOCATION:  3021                                 FACILITY:  [facility]   PHYSICIAN:  Hilda Lias, M.D.                DATE OF BIRTH:  1945/07/29   DATE OF ADMISSION:  09/02/2002  DATE OF DISCHARGE:  09/08/2002                                 DISCHARGE SUMMARY   ADMISSION DIAGNOSES:  1. Cervical neuropathy.  2. Diabetes insipidus with hypoglycemic event.  3. Cerebrovascular stroke accident.   DISCHARGE DIAGNOSES:  1. Cervical neuropathy.  2. Diabetes insipidus with hypoglycemic event.  3. Cerebrovascular stroke accident.   [name] is a 65 year old who was going to have surgery by me last year.  During the workup, we found that he has a severe case of diabetes; and from  then on, this gentleman ended up having a CVA plus cardiac stents.  Now he  is coming back to have surgery.   Laboratory at the present time is within normal limits.   During immediately postoperatively, we have problems with glucose going down  to 20 and up to 200.  We got the endocrinologist who helped [place] with the  care.  Right now is glucose is under control, and the rest of the blood  workup is essentially normal.   COURSE IN THE HOSPITAL:  The patient was admitted to surgery on September 02, 2002, and decompression of the __________ of the neck was done.  Besides the  event that we earlier explained, the patient right now is doing really well.  He noted that he is getting stronger.  He is being discharged today to be  followed by me in my office.   CONDITION ON DISCHARGE:  Improved.   MEDICATIONS:  Percocet for pain.  He is going to restart the Coumadin this  coming Sunday.  He is going to continue with his medications for diabetes  and his heart.   He is going to be followed by his physician, Dr. Micah Noel, in [place].   DIET:  He will continue with the ADA  diet.   He is not to drive and no to do any heavy lifting.   FOLLOWUP:  I will be seeing him in my office in four weeks.                                                Hilda Lias, M.D.    EB/MEDQ  D:  09/08/2002  T:  09/09/2002  Job:  [id number]   cc:   [facility], [place], [place] Dr. Micah Noel    cc:   [facility], [place], [place] Dr. Micah Noel

## 2010-07-14 NOTE — Discharge Summary (Signed)
NAMETAYM, TWIST NO.:  [id number]   MEDICAL RECORD NO.:  [id number]                   PATIENT TYPE:  IPS   LOCATION:  4037                                 FACILITY:  [facility]   PHYSICIAN:  Erick Colace, M.D.           DATE OF BIRTH:  September 09, 1945   DATE OF ADMISSION:  03/06/2002  DATE OF DISCHARGE:  03/22/2002                                 DISCHARGE SUMMARY   DISCHARGE DIAGNOSES:  1. Recurrent chest pain with elevated cardiac enzymes.  2. Right cerebrovascular accident with left hemiparesis.  3. Diabetes mellitus.  4. Hypertension.  5. Dysphagia, resolved.  6. Situational depression.   HISTORY OF PRESENT ILLNESS:  The patient is a 65 year old male with a  history of cervical DDD as well as radiculopathy, TIAs, hypertension,  diabetes mellitus, admitted to [facility] February 27, 2002 with  slurred speech and left-sided weakness.  Also noted to have epigastric pain  with diaphoresis and nausea en route to the hospital with elevated cardiac  enzymes.  He did develop bradycardia and hypotension past admission.  CCT  done showed areas of chronic infarct.  MRI January 2 showed increased signal  in left lentiform nucleus.  Carotid duplex showed no ICA stenosis.  The  patient was placed on IV heparin and Dr. Lady Gary was consulted for input.  He  recommended avoiding beta blockers with an echo and did not recommend  invasive evaluation at this time, with cardiac catheterization if chest pain  recurs.  Laboratory checks for coagulopathy have been negative.  Repeat MRI  showed increased signal in inferior cerebellar peduncle to mid brain  comparable with subacute ischemic event and some stenosis in left ECA distal  region.  The patient has been transitioned to Coumadin.  MBS has shown some  transient symptoms of aspiration with thin and nectar and the patient on D-3  honey-thick liquids.  He is noted to have some dysarthria problems and  problems writing, has had two problems with vagal episodes of bradycardia.  Some elevated blood sugars also reported.  Left-sided weakness is noted to  be improving.  PT initiated and the patient is currently minimum to modified  assist for bed mobility, minimum assist for transfers and improved sitting  balance reported.   PAST MEDICAL HISTORY:  1. DDD of cervical spine with radiculopathy.  2. TIA in November 2003.  3. DM.  4. Appendectomy.  5. Hypertension.  6. Dyslipidemia.  7. Bilateral upper extremity and bilateral lower extremity neuropathy.   ALLERGIES:  No known drug allergies.   SOCIAL HISTORY:  The patient is married, independent and active prior to  admission.  Lives in two-level home with half bathroom on first level and  bedroom on second.  Does not use any tobacco or alcohol.   HOSPITAL COURSE:  The patient was admitted to rehab on March 06, 2002 for  inpatient therapies to consist of  PT and OT daily.  Past admission the  patient has been maintained on Coumadin per recommendations.  His non-Q-wave  MI treated medically.  Blood sugars monitored on an a.c. and h.s. basis.  The patient reported having been advanced to nectar-thick liquids and this  was initiated and continued until [facility] on January 10 that showed the patient  with some penetration of thin liquids that was eliminated with chin tucks.  He was advanced to D-3 diet with thin liquids.  His blood sugars were  initially noted to be elevated and Avandia and Glucophage were increased.  However, the patient was noted to have some problems with GI complaints  question secondary to Glucophage which was discontinued and the patient was  maintained on Lantus.  Labs done on January 12 showed hemoglobin 14.4,  hematocrit 42.4, white count 10.6, platelets 303.  Sodium 135, potassium  4.3, chloride 98, CO2 28, BUN 20, creatinine 0.8, glucose 219.  The patient  has had problems with depressed mood and Dr. Sandria Manly from  neuropsych was  consulted for input.  He felt the patient with flat affect and occasional  tearfulness and decrease in initiative likely secondary to subcortical  vascular changes.  The patient showed psychological denial and lack of  appreciation of his functional impairment.  The patient was started on oral  antidepressant to help with his mood.  The patient with some complaints of  nausea, no chest pain or diaphoresis, on January 15.  Cardiac enzymes x3  were drawn and were negative.  EKG showed no change.   Physical therapy was initiated and the patient showed good progress.  He did  progress to being supervision for bed mobility, minimum assist for  transfers, minimum assist for ambulating 80 feet with a rolling walker with  occasional loss of balance secondary to favoring of his left lower  extremity.  He required supervision to minimum assist for ADLs, was  independent for toileting and hygiene.  Speech therapy evaluation showed the  patient's basic and high-level comprehension to be intact, basic power of  expression to be intact.  He did require minimum assist for organizational  tasks such as planning and balancing his checkbook.   On March 21, 2002 the patient went on a day pass that was completed  without any difficulty.  On the p.m. past return from pass the patient had  some complaints of burning pain in chest that was relieved with  nitroglycerin initially.  He was noted to have recurrent chest pain on  January 25 a.m.  EKG done showed some ST abnormality and cardiac enzymes  showed troponin of 0.6 and CK-MB of 21.6.  As he continued with chest pain  and left elbow pain, cardiology was consulted for input.  The patient was  treated with sublingual nitroglycerin without relief and four baby aspirin.  He was started on IV fluids and transferred to the cardiac floor for further  workup and treatment.  On March 22, 2002 the patient was discharged to acute services.   Discharge medications and treatment per cardiology.     Greg Cutter, P.A.                    Erick Colace, M.D.    PP/MEDQ  D:  04/28/2002  T:  04/28/2002  Job:  [id number]   cc:   Dr. [facility] in [place]   Dr. Randa Lynn, [facility] in [place]

## 2010-07-14 NOTE — Consult Note (Signed)
Grant Sparks, Grant Sparks NO.:  [id number]   MEDICAL RECORD NO.:  [id number]          PATIENT TYPE:  EMS   LOCATION:  MAJO                         FACILITY:  [facility]   PHYSICIAN:  Gerrit Friends. Dietrich Pates, MD, FACCDATE OF BIRTH:  06/17/1945   DATE OF CONSULTATION:  06/03/2006  DATE OF DISCHARGE:                                 CONSULTATION   PRIMARY CARDIOLOGIST:  Dr. Angelina Sheriff.   PRIMARY CARE PHYSICIAN:  Dr. Lorin Picket at [facility] in [place].   REQUESTING PHYSICIAN:  Dr. Sandria Manly from neurology.   PATIENT PROFILE:  A 65 year old Caucasian male with prior history of CVA  and non-ST-elevation MI with stenting of the RCA, who presents with TIA  and subsequent chest discomfort while here in the ED.   PROBLEM LIST:  1. CAD.      a.     Non-ST-elevation MI in the stenting of a CVA in January       2004, which was managed conservatively at [facility].      b.     Recurrent non-ST-elevation MI, March 22, 2002, while in       inpatient rehab at [facility].      c.     March 23, 2002, a cath/PCI with 99% mid RCA with placement       of a 3.5 x 20-mm Express-2 bare metal stent, as well as a 4.0 x 16-       mm Express-2 bare metal stent.  The proximal PDA also had balloon       angioplasty.      d.     April 08, 2002, cardiac catheterization for recurrent       chest discomfort with left main normal, LAD 60-70% distal/apical       unchanged from prior exam.  A1 60%.  Left circumflex 30-40%       proximal.  RCA with patent stents in the mid RCA with 20-30%       stenosis of the PDA.  EF at that time was 60%.  2. History of TIAs and CV.      a.     January 2004, left basal ganglia CVA.      b.     Residual balance disturbance.  3. Hyperlipidemia.  4. Type 2 diabetes mellitus, diagnosed in 2003.  5. Hypertension, diagnosed 2003.  6. Degenerative joint disease.  7. Cataracts, status post bilateral surgeries.  8. Cervical spine  stenosis and radiculopathy, status post diskectomy      and decompression by Dr. Jeral Fruit in July 2004.  9. Status post appendectomy.   HISTORY OF PRESENT ILLNESS:  A 65 year old Caucasian male with a history  of CVA, non-ST-elevation MI in January 2004, status post bare metal  stents x2 to the mid RCA, and PTCA of the PDA as outlined above.  Since  2004, he has otherwise been in his usual state of health, walking  approximately one time per week without any chest pain, or shortness of  breath.  This morning he  awoke and shortly after getting out of bed  noted a left-sided numbness and tingling as well as nausea, diaphoresis,  and slurred speech.  His wife checked his blood sugar and it was noted  be between 110 and 114, on multiple readings.  He came into the ED at  approximately 9:30 a.m. and a code stroke was initiated.  A head CT was  performed which showed no acute stroke and stable atrophy and small  vessel ischemic changes.  He has been seen by neurology and planned for  an MRI.  At approximately 10 a.m. here while at rest on a stretcher, he  develop 5/10 and left chest and triceps pressure, which he says is  similar to his previous angina however, without associated symptoms.  His symptoms have been intermittent since 10 a.m., generally lasting of  30 minutes and resolving spontaneously.  He had approximately 4-5  episodes since arriving.  His ECG is without any acute changes and his  first set of cardiac markers is negative.  He is currently pain free.   He denies any PND, orthopnea, dizziness, syncope, edema, or early  satiety.   ALLERGIES:  AMBIEN.   Notably, there is an allergy listed to Glucophage, however, the patient  is on Metformin and tolerates this just fine.  He says that he was never  allergic to Glucophage.   HOME MEDICATIONS:  1. Ramipril 5 mg daily.  2. Simvastatin 40 mg q.h.s.  3. Lopressor 50 mg b.i.d.  4. Aspirin 81 mg daily.  5. Nitroglycerin 0.4 mg  sublingual p.r.n.  6. Metformin 1,000 mg b.i.d.  7. Multivitamin one daily.  8. Levmir insulin 10 units q.a.m. and 26 units q.p.m.   FAMILY HISTORY:  Mother died of an MI at age 66.  Father died of an MI  at age 62.  He is one of twelve children.  He has a brother who died of  an MI at age 56.   SOCIAL HISTORY:  He lives in [place], with his wife.  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  He has two adult  children and three grandchildren.  He denies any tobacco, alcohol or  drug use in his lifetime.  He walks about once a week.   REVIEW OF SYSTEMS:  Positive for intermittent chest discomfort as  outlined in the HPI.  Positive for nausea, diaphoresis, numbness, and  tingling, as well as slurred speech, all of which have now resolved.   PHYSICAL EXAMINATION:  VITAL SIGNS:  Temperature 97.2, heart rate 65,  respirations 22, blood pressure 155/75, pulse ox 100% on 2 liters.  GENERAL:  A pleasant white male in no acute distress, awake, alert and  oriented x3.  NECK:  No bruits or JVD.  LUNGS:  Respirations regular unlabored.  Clear to auscultation.  CARDIAC:  Regular S1 S2.  No S3, S4, or murmurs.  ABDOMEN:  Round, soft, nontender, nondistended.  Bowel sounds present  x4.  No bruits.  EXTREMITIES:  Warm, dry, pink.  No clubbing, cyanosis or edema.  Dorsalis pedis, posterior tibial pulses 2+ and equal bilaterally.  No  femoral bruits noted.   Head CT shows no acute stroke, stable atrophy, and small-vessel ischemic  changes.  EKG shows sinus rhythm with a rate of 63 beats per minute, no  acute ST-T changes.   LABORATORY:  Hemoglobin 13.6, hematocrit 40.1, WBC 7.4, platelets 206.  CK-MB 120, troponin-I less than 0.05.   ASSESSMENT/PLAN:  1.  Transient ischemic attack, per neurology.  MRI is pending.  2. Chest pain/coronary artery disease.  This has been intermittent      over the past 6 hours lasting approximately 30 minutes at a time     without associated  symptoms, although similar to previous angina.      Notably, symptoms have resolved spontaneously without any      nitroglycerin.  His ECG is without any acute changes, and his first      set cardiac markers negative.  We will continue to cycle cardiac      enzymes, and would also recommend continuation of his aspirin,      Statin, beta blocker, and ACE inhibitor.  We will add nitroglycerin      paste as well as heparin anticoagulation if okay with neurology.      If cardiac markers remain negative and the patient has no recurrent      chest pain, likely plan on inpatient versus outpatient adenosine      Myoview to evaluate for ischemia.  If, however, the patient      continues have intermittent chest discomfort and/or cardiac markers      are elevated, we would likely plan cardiac catheterization.  3. Hypertension.  Blood pressure is currently elevated.  Although, he      notes that he did not take his blood pressure medicines this      morning.  Continue to follow and titrate medications necessary.  4. Hyperlipidemia.  Check lipids and LFTs.  Continue Statin.  5. Type 2 diabetes mellitus.  Hold metformin case catheterization is      necessary.      Nicolasa Ducking, ANP      Gerrit Friends. Dietrich Pates, MD, [facility]  Electronically Signed    CB/MEDQ  D:  06/03/2006  T:  06/03/2006  Job:  [id number]

## 2010-07-14 NOTE — Discharge Summary (Signed)
NAME:  Grant Sparks, Grant Sparks                 ACCOUNT NO.:  [id number]   MEDICAL RECORD NO.:  [id number]          PATIENT TYPE:  INP   LOCATION:  3018                         FACILITY:  [facility]   PHYSICIAN:  Pramod P. Pearlean Brownie, MD    DATE OF BIRTH:  03-06-1945   DATE OF ADMISSION:  06/03/2006  DATE OF DISCHARGE:  06/04/2006                               DISCHARGE SUMMARY   ADMISSION DIAGNOSIS:  Transient ischemic attack.   DISCHARGE DIAGNOSES:  1. Transient speech difficulties and left hand paresthesias of unclear      etiology, perhaps underlying stress/anxiety-related.  2. Atypical chest pain unlikely to be of ischemic origin per      Cardiology.  3. Ischemic heart disease.  4. Diabetes.  5. Hypertension.   HOSPITAL COURSE:  [name] is a 65 year old pleasant Caucasian male who  was admitted with sudden onset of speech difficulties as well as left  hand paresthesias.  He presented to [facility] Room, where he  was evaluated by Dr. Sandria Manly.  He was not found to have significant focal  deficits.  His sugars were found to be in the normal range.  He  complained of some atypical chest pain after admission.  Several EKGs  were normal.  Dr. Antoine Poche, his cardiologist, was consulted; he  evaluated the patient and felt the description was not typical for  ischemic pain.  He, however, recommended an outpatient Cardiolite stress  test, which was arranged.  The patient had a previous history of  subendocardial MIs in the past and had undergone coronary angioplasty  stenting in 2004.  He also had a previous old right brain stroke seen on  CT scan.  The patient was admitted to the stroke unit and underwent  telemetry monitoring, which did not reveal any significant cardiac  arrhythmias overnight.  MRI scan of the brain was performed which showed  no evidence of new stroke.  The patient had a carotid ultrasound done  which showed no hemodynamically significant stenosis.  Lipid profile was  normal.   Hemoglobin A1c was normal at 6.9.  Urine drug screen was  positive only for opiates which he takes for pain.  Cardiac enzymes were  normal.  UA was negative.  Chest x-ray was normal.  The patient's  aspirin dose was increased from 81mg  to 325 mg a day and on the day of  discharge, he had some atypical chest pain at about 11:30 without any  EKG changes.  He was observed for 5 hours after this and remained  symptom-free and hence at the time of discharge, was considered stable.  He was advised to take aspirin 325 mg a day and resume all his other  home medications.   FOLLOWUP:  He was advised to follow up with Dr. Antoine Poche to arrange a  stress test in the next week or 2.  He was advised to call Dr. Marlis Edelson  office in the future if he needed a followup.   DISCHARGE MEDICATIONS:  1. Aspirin 325 mg a day.  2. Metformin 1000 mg twice a day.  3. Altace 5  mg a day.  4. Lantus insulin 10 units in the morning, 26 units in the evening.  5. Zocor 40 mg a day.  6. Metoprolol 50 mg twice a day.           ______________________________  Sunny Schlein. Pearlean Brownie, MD     PPS/MEDQ  D:  06/04/2006  T:  06/05/2006  Job:  [id number]

## 2010-07-28 ENCOUNTER — Observation Stay: Payer: Self-pay | Admitting: Internal Medicine

## 2010-08-02 ENCOUNTER — Encounter: Payer: Self-pay | Admitting: Cardiology

## 2010-08-29 ENCOUNTER — Encounter: Payer: Self-pay | Admitting: Cardiology

## 2010-08-31 ENCOUNTER — Encounter: Payer: Self-pay | Admitting: Cardiology

## 2010-08-31 ENCOUNTER — Ambulatory Visit (INDEPENDENT_AMBULATORY_CARE_PROVIDER_SITE_OTHER): Payer: Self-pay | Admitting: Cardiology

## 2010-08-31 VITALS — BP 135/72 | HR 78 | Resp 18 | Ht 72.0 in | Wt 171.0 lb

## 2010-08-31 DIAGNOSIS — I6350 Cerebral infarction due to unspecified occlusion or stenosis of unspecified cerebral artery: Secondary | ICD-10-CM

## 2010-08-31 DIAGNOSIS — I639 Cerebral infarction, unspecified: Secondary | ICD-10-CM | POA: Insufficient documentation

## 2010-08-31 DIAGNOSIS — I2510 Atherosclerotic heart disease of native coronary artery without angina pectoris: Secondary | ICD-10-CM

## 2010-08-31 NOTE — Assessment & Plan Note (Signed)
He had a recent stress test that was negative.  The patient has no new sypmtoms.  No further cardiovascular testing is indicated.  We will continue with aggressive risk reduction and meds as listed.

## 2010-08-31 NOTE — Progress Notes (Signed)
HPI The patient presents for follow up of CAD.  Since I last saw him he was hospitalized briefly at [facility] apparently for evaluation of chest discomfort. He reports a negative nuclear study for any evidence of ischemia. He was having some right arm discomfort at that time. Since then he has had no further discomfort. 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 He denies any chest pressure, neck or arm discomfort. He said no palpitations, presyncope or syncope. He said no weight gain or edema.  No Known Allergies  Current Outpatient Prescriptions  Medication Sig Dispense Refill  . aspirin 81 MG tablet Take 81 mg by mouth daily.        . insulin detemir (LEVEMIR FLEXPEN) 100 UNIT/ML injection Inject into the skin as directed.        . metFORMIN (GLUCOPHAGE) 1000 MG tablet Take 1,000 mg by mouth. Take 1 tablet every morning and 1 and 1/2 tablet every evening.       . metoprolol (LOPRESSOR) 50 MG tablet Take 50 mg by mouth 2 (two) times daily.        . nitroGLYCERIN (NITROSTAT) 0.4 MG SL tablet Place 0.4 mg under the tongue as needed.        . ramipril (ALTACE) 5 MG tablet Take 5 mg by mouth daily.        . saxagliptin HCl (ONGLYZA) 5 MG TABS tablet Take 5 mg by mouth daily.        . simvastatin (ZOCOR) 40 MG tablet Take 40 mg by mouth as needed.        [name] DISCONTD: aspirin 81 MG EC tablet Take 81 mg by mouth 2 (two) times daily.          Past Medical History  Diagnosis Date  . CAD (coronary artery disease)     S/P stenting of the RCA x2 and PTCA of a PDA lesion in Jan 2004. Pt had a myoracidal infarction when he was in rehabilitation following a CVA.  . MI (myocardial infarction)   . CVA (cerebral vascular accident)     Right  CVA, left hemiparesis    Past Surgical History  Procedure Date  . Cataract extraction   . Appendectomy   . Cervical spine surgery     ROS:  As stated in the HPI and negative for all other systems.  PHYSICAL EXAM BP 135/72  Pulse 78  Resp 18  Ht 6' (1.829 m)  Wt  171 lb (77.565 kg)  BMI 23.19 kg/m2 GENERAL:  Well appearing HEENT:  Pupils equal round and reactive, fundi not visualized, oral mucosa unremarkable, dentures NECK:  No jugular venous distention, waveform within normal limits, carotid upstroke brisk and symmetric, no bruits, no thyromegaly LYMPHATICS:  No cervical, inguinal adenopathy LUNGS:  Clear to auscultation bilaterally BACK:  No CVA tenderness CHEST:  Unremarkable HEART:  PMI not displaced or sustained,S1 and S2 within normal limits, no S3, no S4, no clicks, no rubs, no murmurs ABD:  Flat, positive bowel sounds normal in frequency in pitch, no bruits, no rebound, no guarding, no midline pulsatile mass, no hepatomegaly, no splenomegaly EXT:  2 plus pulses throughout, no edema, no cyanosis no clubbing SKIN:  No rashes no nodules NEURO:  Cranial nerves II through XII grossly intact, motor grossly intact throughout PSYCH:  Cognitively intact, oriented to person place and time   EKG:  Sinus rhythm, rate 72, RBBB, axis within normal limits, intervals within normal limits, no acute ST-T wave changes.   ASSESSMENT AND  PLAN

## 2010-08-31 NOTE — Assessment & Plan Note (Signed)
He has mild residual deficit from this.  No change in therapy is indicated.

## 2010-08-31 NOTE — Patient Instructions (Signed)
Your physician recommends that you continue on your current medications as directed. Please refer to the Current Medication list given to you today. Your physician recommends that you schedule a follow-up appointment in: 18 months

## 2015-07-19 ENCOUNTER — Encounter: Payer: Self-pay | Admitting: Cardiology

## 2015-07-19 ENCOUNTER — Ambulatory Visit (INDEPENDENT_AMBULATORY_CARE_PROVIDER_SITE_OTHER): Payer: Medicare Other | Admitting: Cardiology

## 2015-07-19 VITALS — BP 153/75 | HR 61 | Ht 72.0 in | Wt 182.6 lb

## 2015-07-19 DIAGNOSIS — R0602 Shortness of breath: Secondary | ICD-10-CM

## 2015-07-19 MED ORDER — NITROGLYCERIN 0.4 MG SL SUBL: 0.4000 mg | SUBLINGUAL_TABLET | SUBLINGUAL | Status: DC | PRN

## 2015-07-19 NOTE — Progress Notes (Signed)
Cardiology Office Note   Date:  07/19/2015   ID:  Grant NakayamaCharles E Loughner, DOB [birth year], MRN [id number]  PCP:  Reola MosherAndrew S. Randa LynnLamb, MD  Cardiologist:   Rollene RotundaJames Lelani Garnett, MD   Chief Complaint  Patient presents with  . Coronary Artery Disease      History of Present Illness: Grant Sparks is a [age] y.o. male who presents for evaluation of coronary disease. I haven't seen him since 2012. He has a history of coronary disease as described below. He had a previous CVA at the time of one of his infarcts as well. 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 He's able to do activities such as working in the yard. With this level of activity he was not having any problems until a few weeks ago. He started just "not feeling right". In particular he had some leg weakness. He didn't have any of the arm discomfort that was his previous angina. He does have some dyspnea with exertion. He's not had any chest or neck discomfort. He went and saw his primary care. Chest x-ray was unremarkable. He had continued sensations of some lower back pain and feeling off balance. Briefly he was started on Plavix and stopped his aspirin but he developed some bleeding issues with this. He switched back to the aspirin. He says now his weakness is better. He's not having any other symptoms. However, it has been quite a while since he had his heart evaluated and so he needed to be seen.  Past Medical History  Diagnosis Date  . CAD (coronary artery disease)     S/P stenting of the RCA x2 and PTCA of a PDA lesion in Jan 2004. Pt had a myoracidal infarction when he was in rehabilitation following a CVA.  . MI (myocardial infarction) (HCC)   . CVA (cerebral vascular accident) [facility](HCC)     Right  CVA, left hemiparesis    Past Surgical History  Procedure Laterality Date  . Cataract extraction    . Appendectomy    . Cervical spine surgery       Current Outpatient  Prescriptions  Medication Sig Dispense Refill  . aspirin 81 MG tablet Take 81 mg by mouth daily.      [name]. glipiZIDE (GLUCOTROL XL) 5 MG 24 hr tablet Take 5 mg by mouth daily.    . insulin detemir (LEVEMIR FLEXPEN) 100 UNIT/ML injection Inject into the skin as directed.      . metFORMIN (GLUCOPHAGE) 1000 MG tablet Take 1,000 mg by mouth. Take 1 tablet every morning and 1 and 1/2 tablet every evening.     . metoprolol (LOPRESSOR) 50 MG tablet Take 50 mg by mouth 2 (two) times daily.      . nitroGLYCERIN (NITROSTAT) 0.4 MG SL tablet Place 1 tablet (0.4 mg total) under the tongue as needed. 25 tablet 3  . ramipril (ALTACE) 5 MG tablet Take 5 mg by mouth daily.      . simvastatin (ZOCOR) 40 MG tablet Take 40 mg by mouth as needed.       No current facility-administered medications for this visit.    Allergies:   Tadalafil    Social History:  The patient  reports that he has never smoked. He does not have any smokeless tobacco history on file. He reports that he does not drink alcohol.   Family History:  The patient's family history includes  Heart attack (age of onset: 79) in his brother; Heart attack (age of onset: 66) in his father; Heart attack (age of onset: 25) in his mother.    ROS:  Please see the history of present illness.   Otherwise, review of systems are positive for none.   All other systems are reviewed and negative.    PHYSICAL EXAM: VS:  BP 153/75 mmHg  Pulse 61  Ht 6' (1.829 m)  Wt 182 lb 9.6 oz (82.827 kg)  BMI 24.76 kg/m2  SpO2 96% , BMI Body mass index is 24.76 kg/(m^2). GENERAL:  Well appearing HEENT:  Pupils equal round and reactive, fundi not visualized, oral mucosa unremarkable NECK:  No jugular venous distention, waveform within normal limits, carotid upstroke brisk and symmetric, no bruits, no thyromegaly LYMPHATICS:  No cervical, inguinal adenopathy LUNGS:  Clear to auscultation bilaterally BACK:  No CVA tenderness CHEST:  Unremarkable HEART:  PMI not  displaced or sustained,S1 and S2 within normal limits, no S3, no S4, no clicks, no rubs, no murmurs ABD:  Flat, positive bowel sounds normal in frequency in pitch, no bruits, no rebound, no guarding, no midline pulsatile mass, no hepatomegaly, no splenomegaly EXT:  2 plus pulses throughout, no edema, no cyanosis no clubbing SKIN:  No rashes no nodules NEURO:  Cranial nerves II through XII grossly intact, motor grossly intact throughout PSYCH:  Cognitively intact, oriented to person place and time    EKG:  EKG is not ordered today. The ekg ordered today demonstrates sinus rhythm, rate 61, left axis deviation, left anterior fascicular block, right bundle branch block.   Recent Labs: No results found for requested labs within last 365 days.    Lipid Panel No results found for: CHOL, TRIG, HDL, CHOLHDL, VLDL, LDLCALC, LDLDIRECT    Wt Readings from Last 3 Encounters:  07/19/15 182 lb 9.6 oz (82.827 kg)  08/31/10 171 lb (77.565 kg)  12/21/08 181 lb (82.101 kg)      Other studies Reviewed: Additional studies/ records that were reviewed today include: Labs from PCP. Review of the above records demonstrates:  Please see elsewhere in the note.     ASSESSMENT AND PLAN:  SOB/CAD:  The patient has some mild vague symptoms. He has known coronary disease. He needs screening with a stress test. However, he has an abnormal baseline EKG and he has some residual deficits from his stroke. He would not be a walk on a treadmill. Therefore, he will have a [employer].  LEG WEAKNESS:  I wouldn't suspect this to be related to his cardiac etiology. He will follow this PCP.  HTN:  The blood pressure is mildly elevated but this is unusual. They will keep a blood pressure diary.  DYSLIPIDEMIA:  His LDL was 86 and his HDL 39. He will continue the meds as listed.   Current medicines are reviewed at length with the patient today.  The patient does not have concerns regarding medicines.  The  following changes have been made:  no change  Labs/ tests ordered today include:   Orders Placed This Encounter  Procedures  . Myocardial Perfusion Imaging  . EKG 12-Lead     Disposition:   FU with 18 months.     Signed, [name], MD  07/19/2015 5:36 PM    [facility]

## 2015-07-19 NOTE — Patient Instructions (Signed)
Medication Instructions:  Continue current medications  Labwork: NONE  Testing/Procedures: Your physician has requested that you have a lexiscan myoview. For further information please visit [email or web address]. Please follow instruction sheet, as given.  Follow-Up: Your physician wants you to follow-up in: 2 Years. You will receive a reminder letter in the mail two months in advance. If you don't receive a letter, please call our office to schedule the follow-up appointment.  Any Other Special Instructions Will Be Listed Below (If Applicable).  If you need a refill on your cardiac medications before your next appointment, please call your pharmacy.

## 2015-07-29 ENCOUNTER — Inpatient Hospital Stay (HOSPITAL_COMMUNITY): Admission: RE | Admit: 2015-07-29 | Payer: Medicare Other | Source: Ambulatory Visit

## 2015-07-29 ENCOUNTER — Telehealth (HOSPITAL_COMMUNITY): Payer: Self-pay

## 2015-07-29 NOTE — Telephone Encounter (Signed)
Encounter complete. 

## 2015-08-03 ENCOUNTER — Ambulatory Visit (HOSPITAL_COMMUNITY)
Admission: RE | Admit: 2015-08-03 | Discharge: 2015-08-03 | Disposition: A | Discharge status: Home / Self Care | Payer: Medicare Other | Source: Ambulatory Visit | Attending: Cardiovascular Disease | Admitting: Cardiovascular Disease

## 2015-08-03 DIAGNOSIS — Z8249 Family history of ischemic heart disease and other diseases of the circulatory system: Secondary | ICD-10-CM | POA: Insufficient documentation

## 2015-08-03 DIAGNOSIS — R531 Weakness: Secondary | ICD-10-CM | POA: Diagnosis not present

## 2015-08-03 DIAGNOSIS — I4510 Unspecified right bundle-branch block: Secondary | ICD-10-CM | POA: Insufficient documentation

## 2015-08-03 DIAGNOSIS — R0609 Other forms of dyspnea: Secondary | ICD-10-CM | POA: Insufficient documentation

## 2015-08-03 DIAGNOSIS — I10 Essential (primary) hypertension: Secondary | ICD-10-CM | POA: Insufficient documentation

## 2015-08-03 DIAGNOSIS — E119 Type 2 diabetes mellitus without complications: Secondary | ICD-10-CM | POA: Insufficient documentation

## 2015-08-03 DIAGNOSIS — R0602 Shortness of breath: Secondary | ICD-10-CM | POA: Diagnosis present

## 2015-08-03 DIAGNOSIS — R5383 Other fatigue: Secondary | ICD-10-CM | POA: Diagnosis not present

## 2015-08-03 DIAGNOSIS — R9439 Abnormal result of other cardiovascular function study: Secondary | ICD-10-CM | POA: Diagnosis not present

## 2015-08-03 LAB — MYOCARDIAL PERFUSION IMAGING
CHL CUP RESTING HR STRESS: 60 {beats}/min
LV sys vol: 55 mL
LVDIAVOL: 111 mL (ref 62–150)
NUC STRESS TID: 1.22
Peak HR: 85 {beats}/min
SDS: 1
SRS: 0
SSS: 1

## 2015-08-03 MED ORDER — TECHNETIUM TC 99M TETROFOSMIN IV KIT
10.2000 | PACK | Freq: Once | INTRAVENOUS | Status: AC | PRN
  Administered 2015-08-03: 10 via INTRAVENOUS
  Filled 2015-08-03: qty 10

## 2015-08-03 MED ORDER — AMINOPHYLLINE 25 MG/ML IV SOLN
75.0000 mg | Freq: Once | INTRAVENOUS | Status: AC
  Administered 2015-08-03: 75 mg via INTRAVENOUS

## 2015-08-03 MED ORDER — TECHNETIUM TC 99M TETROFOSMIN IV KIT
30.2000 | PACK | Freq: Once | INTRAVENOUS | Status: AC | PRN
  Administered 2015-08-03: 30.2 via INTRAVENOUS
  Filled 2015-08-03: qty 30

## 2015-08-03 MED ORDER — REGADENOSON 0.4 MG/5ML IV SOLN
0.4000 mg | Freq: Once | INTRAVENOUS | Status: AC
  Administered 2015-08-03: 0.4 mg via INTRAVENOUS

## 2015-10-27 ENCOUNTER — Ambulatory Visit: Payer: Medicare Other | Attending: Student

## 2015-10-27 DIAGNOSIS — R262 Difficulty in walking, not elsewhere classified: Secondary | ICD-10-CM | POA: Diagnosis present

## 2015-10-27 DIAGNOSIS — M6281 Muscle weakness (generalized): Secondary | ICD-10-CM | POA: Diagnosis present

## 2015-10-27 DIAGNOSIS — M25552 Pain in left hip: Secondary | ICD-10-CM | POA: Insufficient documentation

## 2015-10-27 DIAGNOSIS — M545 Low back pain: Secondary | ICD-10-CM

## 2015-10-27 NOTE — Patient Instructions (Signed)
Caudal Rotation: Hip Roll, Neutral Lordosis - Supine   Lie with knees bent, feet flat. Lower knees out to right side, rotating hips and trunk. Return. Repeat for other side. Repeat __5__ times per set. Do _3___ sets per session 2 times daily.   Copyright  VHI. All rights reserved.

## 2015-10-27 NOTE — Therapy (Signed)
[facility] [address], [place], [address] Phone: [phone]   Fax:  [phone]  Physical Therapy Evaluation  Patient Details  Name: Grant Sparks MRN: [id number] Date of Birth: 1945/11/08 Referring Provider: Glorious Peach, PA  Encounter Date: 10/27/2015      PT End of Session - 10/27/15 0926    Visit Number 1   Number of Visits 17   Date for PT Re-Evaluation 12/22/15   Authorization Type 1   Authorization Time Period of 10 Medicare   PT Start Time 0927   PT Stop Time 1036   PT Time Calculation (min) 69 min   Activity Tolerance Patient tolerated treatment well   Behavior During Therapy [facility] for tasks assessed/performed      Past Medical History:  Diagnosis Date  . CAD (coronary artery disease)    S/P stenting of the RCA x2 and PTCA of a PDA lesion in Jan 2004. Pt had a myoracidal infarction when he was in rehabilitation following a CVA.  [name] CVA (cerebral vascular accident) (HCC)    Right  CVA, left hemiparesis  . Diabetes mellitus without complication (HCC)   . MI (myocardial infarction) [facility])     Past Surgical History:  Procedure Laterality Date  . APPENDECTOMY    . CATARACT EXTRACTION    . CERVICAL SPINE SURGERY    . coronary artery stent placement     2004    There were no vitals filed for this visit.       Subjective Assessment - 10/27/15 0934    Subjective L hip pain: 4-5/10 current, 8-9/10 at worst (for the past 3 months). Some days his hip feels better than others.    Patient is accompained by: Family member  Pt wife. Pt and wife states blood pressure is controlled.    Pertinent History L hip pain, gait instability. L hip has been bothering him for the past 3 months. Had an x-ray for his L hip which was negative for abnormalities. Bought a new mattress which helped temporarily. Went to the PA who thinks pain is coming from his lower back. Did some walking in the driveway  which bothered him. Pain bothers him more in the morning but continuously bothers him throughout the day. Pt adds that since his L hip started bothering him more, he feels like he is dragging his L foot more and stumbling on his toes. Had a couple of falls for the past 6 month, both were in the yard. Pt states that he stumbled and fell. Pt states difficulty stopping his forward movement which causes him to fall. Pt also states that yesterday, when he was in a squat position, and fell backwards because he could not get up.  Pt also states having a CVA in 2004 which resulted in L LE weakness and still has weakness since then. Feels like the L LE giving way has worsened.   Pt states having loss of bowel control at times. Pt states that his doctor does not know about that. Pt states that he has had that since his stroke. Pt wife also states that pt had a knot come up in his back in January 2017 and his doctor (Dr Larwance Sachs) said that it was nothing to worry about. Pt and wife state that the knot has gone down but pt states there is something still slightly there. Pt denies saddle anesthesia. Pt denies personal history of CA.    Patient Stated  Goals "For it (L hip) to get better."   Currently in Pain? Yes   Pain Score 5    Pain Location Hip   Pain Orientation Left   Pain Descriptors / Indicators --  dull, leg feels like it gives way at times   Pain Type Chronic pain   Aggravating Factors  walking (any amount), lifting   Pain Relieving Factors tylenol (temporary relief), sitting and resting,             [facility] PT Assessment - 10/27/15 0926      Assessment   Medical Diagnosis L hip pain, gait instability   Referring Provider Glorious Peach, PA   Onset Date/Surgical Date 07/28/15  3 months ago   Prior Therapy No known physical therapy for current condition (L hip pain)     Precautions   Precaution Comments Fall risk     Restrictions   Other Position/Activity Restrictions No known restrictions      Home Environment   Additional Comments Pt lives in a 2 story home with his wife, 5 steps to enter, R rail; 10-12 steps inside with L rail     Prior Function   Vocation Retired   [employer] PLOF: better able to walk with less hip pain or dragging his L foot.      Observation/Other Assessments   Observations TUG: 19 seconds. Greater than 12 seconds suggests increased risk for falls   Modified Oswertry 46%     Posture/Postural Control   Posture Comments forward flexed to the L side, L trunk side bend, L foot in front, decreased bilateral hip extension, R posterior pelvic rotation, bilaterally pronated feet.      AROM   Lumbar Flexion Limited   Lumbar Extension very limited with reproduction of posterior L hip pain   Lumbar - Right Side Bend limited with increased L hip pain   Lumbar - Left Side Bend limited with increased L hip pain   Lumbar - Right Rotation limited with increased L hip pain   Lumbar - Left Rotation limited with increased L hip pain     Strength   Right Hip Flexion 4/5   Right Hip ABduction 4/5   Left Hip Flexion 4-/5   Left Hip ABduction 4-/5   Right Knee Flexion 4+/5   Right Knee Extension 5/5   Left Knee Flexion 4/5   Left Knee Extension 4+/5     Ambulation/Gait   Gait Comments unsteady, L lateral lean during L LE stance phase, antalgic with decreased stance L LE       Objectives  There-ex  Directed patient with S/L L clam shell 2x5  Supine lower trunk rotation 5x3  Reviewed and given as part of his HEP. Pt demonstrated and verbalized understanding.    Improved exercise technique, movement at target joints, use of target muscles after mod to max verbal, visual, tactile cues.                      PT Education - 10/27/15 2003    Education provided Yes   Education Details ther-ex, HEP, plan of care   Person(s) Educated Patient;Spouse   Methods Explanation;Demonstration;Tactile cues;Verbal cues;Handout    Comprehension Verbalized understanding;Returned demonstration             PT Long Term Goals - 10/27/15 2005      PT LONG TERM GOAL #1   Title Patient will have a decrease in L hip pain to 5/10 or less  at worst to promote ability to ambulate and lift items.    Baseline 9/10 L hip pain at worst   Time 8   Period Weeks   Status New     PT LONG TERM GOAL #2   Title Patient will improve bilateral glute med strength by at least 1/2 MMT grade to promote ability to ambulate with less L hip pain.    Time 8   Period Weeks   Status New     PT LONG TERM GOAL #3   Title Patient will improve his TUG time to 14 seconds or less to promote a decrease in fall risk.    Baseline TUG time: 19 seconds   Time 8   Period Weeks   Status New     PT LONG TERM GOAL #4   Title Patient will improve his Modified Oswestry Low Back Pain Disability Questionnaire score by at least 10% as a demonstration of improved function.    Baseline 46%   Time 8   Period Weeks   Status New               Plan - 10/27/15 1232    Clinical Impression Statement Patient is a 70 year old male who came to physical therapy secondary to L hip pain and gait instability. He also presents with altered gait pattern and posture, L LE weakness, decreased lumbopelvic control, trunk stiffness, fall risk, and difficulty performing functional tasks such as walking, and lifting and would benefit from skilled physical therapy services to address the aforementioned deficits.    Rehab Potential Fair   Clinical Impairments Affecting Rehab Potential Hx of CVA, multiple health problems, weakness   PT Frequency 2x / week   PT Duration 8 weeks   PT Treatment/Interventions Therapeutic exercise;Therapeutic activities;Manual techniques;Aquatic Therapy;Balance training;Functional mobility training;Gait training;Neuromuscular re-education;Patient/family education;Dry needling;Iontophoresis 4mg /ml Dexamethasone   PT Next Visit Plan Hip and  trunk strengthening, gait training, balance, lumbopelvic control   Consulted and Agree with Plan of Care Patient      Patient will benefit from skilled therapeutic intervention in order to improve the following deficits and impairments:  Pain, Abnormal gait, Decreased balance, Decreased endurance, Difficulty walking, Decreased strength, Improper body mechanics, Postural dysfunction  Visit Diagnosis: Pain in left hip - Plan: PT plan of care cert/re-cert  Left low back pain, with sciatica presence unspecified - Plan: PT plan of care cert/re-cert  Difficulty in walking, not elsewhere classified - Plan: PT plan of care cert/re-cert  Muscle weakness (generalized) - Plan: PT plan of care cert/re-cert      G-Codes - 10/27/15 2015    Functional Assessment Tool Used Modified Oswestry Low Back Pain Disability Questionnaire, patient interview, clinical presentation   Functional Limitation Mobility: Walking and moving around   Mobility: Walking and Moving Around Current Status ([id number]) At least 40 percent but less than 60 percent impaired, limited or restricted   Mobility: Walking and Moving Around Goal Status [id number]) At least 20 percent but less than 40 percent impaired, limited or restricted       Problem List Patient Active Problem List   Diagnosis Date Noted  . CVA (cerebral vascular accident) (HCC)   . CAD 03/01/2008  . CORONARY ARTERY DISEASE, S/P PTCA 03/01/2008  . CVA 03/01/2008    Thank you for your referral.  Loralyn FreshwaterMiguel Julann Mcgilvray PT, DPT    10/27/2015, 8:24 PM  [facility] [address] [place], [place], [address] Phone: [phone]  Fax:  [phone]  Name: WALTON DIGILIO MRN: [id number] Date of Birth: 10-Jul-1945

## 2015-11-02 ENCOUNTER — Ambulatory Visit: Payer: Medicare Other | Attending: Student

## 2015-11-02 DIAGNOSIS — M545 Low back pain: Secondary | ICD-10-CM | POA: Diagnosis present

## 2015-11-02 DIAGNOSIS — M25552 Pain in left hip: Secondary | ICD-10-CM

## 2015-11-02 DIAGNOSIS — R262 Difficulty in walking, not elsewhere classified: Secondary | ICD-10-CM

## 2015-11-02 DIAGNOSIS — M6281 Muscle weakness (generalized): Secondary | ICD-10-CM | POA: Diagnosis present

## 2015-11-02 NOTE — Therapy (Signed)
[facility] [address] [place], [address] Phone: [phone]   Fax:  [phone]  Physical Therapy Treatment  Patient Details  Name: Grant Sparks MRN: [id number] Date of Birth: [birth year] Referring Provider: Glorious Peach, PA  Encounter Date: 11/02/2015      PT End of Session - 11/02/15 0944    Visit Number 2   Number of Visits 17   Date for PT Re-Evaluation 12/22/15   Authorization Type 2   Authorization Time Period of 10 Medicare   PT Start Time 0944  Pt arrived late   PT Stop Time 1036   PT Time Calculation (min) 52 min   Activity Tolerance Patient tolerated treatment well   Behavior During Therapy [facility] for tasks assessed/performed      Past Medical History:  Diagnosis Date  . CAD (coronary artery disease)    S/P stenting of the RCA x2 and PTCA of a PDA lesion in Jan 2004. Pt had a myoracidal infarction when he was in rehabilitation following a CVA.  [name] CVA (cerebral vascular accident) (HCC)    Right  CVA, left hemiparesis  . Diabetes mellitus without complication (HCC)   . MI (myocardial infarction) [facility])     Past Surgical History:  Procedure Laterality Date  . APPENDECTOMY    . CATARACT EXTRACTION    . CERVICAL SPINE SURGERY    . coronary artery stent placement     2004    There were no vitals filed for this visit.      Subjective Assessment - 11/02/15 0943    Subjective L hip is still bothering him. When doing his exercises, turning his body to the R (for lower trunk rotation) bothers his L hip. Pt also adds that when he walks barefooted, he stubs his L toe. Has that since his CVA but has gotten much worse. Wants to help it get better.    Patient is accompained by: Family member  Pt wife.   Pertinent History L hip pain, gait instability. L hip has been bothering him for the past 3 months. Had an x-ray for his L hip which was negative for abnormalities. Bought a new mattress  which helped temporarily. Went to the PA who thinks pain is coming from his lower back. Did some walking in the driveway which bothered him. Pain bothers him more in the morning but continuously bothers him throughout the day. Pt adds that since his L hip started bothering him more, he feels like he is dragging his L foot more and stumbling on his toes. Had a couple of falls for the past 6 month, both were in the yard. Pt states that he stumbled and fell. Pt states difficulty stopping his forward movement which causes him to fall. Pt also states that yesterday, when he was in a squat position, and fell backwards because he could not get up.  Pt also states having a CVA in 2004 which resulted in L LE weakness and still has weakness since then. Feels like the L LE giving way has worsened.   Pt states having loss of bowel control at times. Pt states that his doctor does not know about that. Pt states that he has had that since his stroke. Pt wife also states that pt had a knot come up in his back in January 2017 and his doctor (Dr Larwance Sachs) said that it was nothing to worry about. Pt and wife state that the knot has  gone down but pt states there is something still slightly there. Pt denies saddle anesthesia. Pt denies personal history of CA.    Patient Stated Goals "For it (L hip) to get better."   Currently in Pain? Yes   Pain Score 6   5-6/10 L hip pain currently            [facility] PT Assessment - 11/02/15 0956      AROM   Right Ankle Dorsiflexion 5   Left Ankle Dorsiflexion -20  with L low back tightness; -8 degrees PROM      Pt denies hx of CA or osteoporosis.               Objectives  There-ex   Directed patient with supine L lower trunk rotation 10x3 with 5 second holds to promote mobility. L low back/hip pain with R lower trunk rotation, no pain with L lower trunk rotation.   Supine L ankle DF AROM multiple times. L posterior hip/low back symptoms which eases with  rest  Seated manually resisted clamshells 10x3 with 5 seconds  Seated L ankle DF AAROM 5x5 seconds (good tibialis anterior muscle contraction palpated)  Sitting up straight (decreasing L lateral lean) with bilateral scapular retraction 3x5 with 5 second holds  Reviewed HEP. Pt demonstrated and verbalized understanding.  Pt education on low back and sciatic nerve.    Modified supine lower trunk rotation HEP for pt to rotate lower body to the L side only. Same number of repetitions and sets. Pt demonstrated and verbalized understanding.    Improved exercise technique, movement at target joints, use of target muscles after mod verbal, visual, tactile cues.      Manual therapy  Gentle A to P to L ankle joint grade 3-. Did not improve DF AROM.   STM to L gastroc in supine. Slight increase in L posterior LE and hip symptoms      Pt demonstrates L ankle tightness with DF to end range as well as posterior L hip and L LE symptoms suggesting neural tension. Pt also demonstrates L lumbar side bending when in the seated position, needing cues for more upright posture and to promote more space at his intervertebral foramina at his L side.          PT Education - 11/02/15 2054    Education provided Yes   Education Details ther-ex, HEP   Person(s) Educated Patient;Spouse   Methods Explanation;Demonstration;Tactile cues;Verbal cues;Handout   Comprehension Returned demonstration;Verbalized understanding             PT Long Term Goals - 10/27/15 2005      PT LONG TERM GOAL #1   Title Patient will have a decrease in L hip pain to 5/10 or less at worst to promote ability to ambulate and lift items.    Baseline 9/10 L hip pain at worst   Time 8   Period Weeks   Status New     PT LONG TERM GOAL #2   Title Patient will improve bilateral glute med strength by at least 1/2 MMT grade to promote ability to ambulate with less L hip pain.    Time 8   Period Weeks   Status New      PT LONG TERM GOAL #3   Title Patient will improve his TUG time to 14 seconds or less to promote a decrease in fall risk.    Baseline TUG time: 19 seconds   Time 8   Period Weeks  Status New     PT LONG TERM GOAL #4   Title Patient will improve his Modified Oswestry Low Back Pain Disability Questionnaire score by at least 10% as a demonstration of improved function.    Baseline 46%   Time 8   Period Weeks   Status New               Plan - 11/02/15 [id number]    Clinical Impression Statement Pt demonstrates L ankle tightness with DF to end range as well as posterior L hip and L LE symptoms suggesting neural tension. Pt also demonstrates L lumbar side bending when in the seated position, needing cues for more upright posture and to promote more space at his intervertebral foramina at his L side.    Rehab Potential Fair   Clinical Impairments Affecting Rehab Potential Hx of CVA, multiple health problems, weakness   PT Frequency 2x / week   PT Duration 8 weeks   PT Treatment/Interventions Therapeutic exercise;Therapeutic activities;Manual techniques;Aquatic Therapy;Balance training;Functional mobility training;Gait training;Neuromuscular re-education;Patient/family education;Dry needling;Iontophoresis 4mg /ml Dexamethasone   PT Next Visit Plan Hip and trunk strengthening, gait training, balance, lumbopelvic control   Consulted and Agree with Plan of Care Patient      Patient will benefit from skilled therapeutic intervention in order to improve the following deficits and impairments:  Pain, Abnormal gait, Decreased balance, Decreased endurance, Difficulty walking, Decreased strength, Improper body mechanics, Postural dysfunction  Visit Diagnosis: Pain in left hip  Left low back pain, with sciatica presence unspecified  Difficulty in walking, not elsewhere classified  Muscle weakness (generalized)     Problem List Patient Active Problem List   Diagnosis Date Noted  . CVA  (cerebral vascular accident) (HCC)   . CAD 03/01/2008  . CORONARY ARTERY DISEASE, S/P PTCA 03/01/2008  . CVA 03/01/2008    Loralyn FreshwaterMiguel Mercedes Fort PT, DPT   11/02/2015, 9:06 PM  [facility] [address] [place], [place], [address] Phone: [phone]   Fax:  [phone]  Name: Grant Sparks MRN: [id number] Date of Birth: [birth year]

## 2015-11-02 NOTE — Patient Instructions (Addendum)
     Scapular Retraction   Sit up straight (not shown)  With arms at sides, pinch shoulder blades together. Hold for 5 seconds. Repeat __5-10__ times per set. Do __3__ sets per session.     Copyright  VHI. All rights reserved.     Modified supine lower trunk rotation HEP for pt to rotate lower body to the L side only. Same number of repetitions and sets. Pt demonstrated and verbalized understanding.

## 2015-11-09 ENCOUNTER — Ambulatory Visit: Payer: Medicare Other

## 2015-11-09 VITALS — BP 158/65 | HR 63

## 2015-11-09 DIAGNOSIS — M545 Low back pain: Secondary | ICD-10-CM

## 2015-11-09 DIAGNOSIS — M6281 Muscle weakness (generalized): Secondary | ICD-10-CM

## 2015-11-09 DIAGNOSIS — R262 Difficulty in walking, not elsewhere classified: Secondary | ICD-10-CM

## 2015-11-09 DIAGNOSIS — M25552 Pain in left hip: Secondary | ICD-10-CM

## 2015-11-09 NOTE — Therapy (Signed)
[facility] [address], [place], [address] Phone: [phone]   Fax:  [phone]  Physical Therapy Treatment  Patient Details  Name: Grant Sparks MRN: [id number] Date of Birth: Nov 06, 1945 Referring Provider: Glorious Peach, PA  Encounter Date: 11/09/2015      PT End of Session - 11/09/15 0946    Visit Number 3   Number of Visits 17   Date for PT Re-Evaluation 12/22/15   Authorization Type 3   Authorization Time Period of 10 Medicare   PT Start Time [phone]   PT Stop Time 1044   PT Time Calculation (min) 58 min   Activity Tolerance Patient tolerated treatment well   Behavior During Therapy [facility] for tasks assessed/performed      Past Medical History:  Diagnosis Date  . CAD (coronary artery disease)    S/P stenting of the RCA x2 and PTCA of a PDA lesion in Jan 2004. Pt had a myoracidal infarction when he was in rehabilitation following a CVA.  [name] CVA (cerebral vascular accident) (HCC)    Right  CVA, left hemiparesis  . Diabetes mellitus without complication (HCC)   . MI (myocardial infarction) [facility])     Past Surgical History:  Procedure Laterality Date  . APPENDECTOMY    . CATARACT EXTRACTION    . CERVICAL SPINE SURGERY    . coronary artery stent placement     2004    Vitals:   11/09/15 0951  BP: (!) 158/65  Pulse: 63        Subjective Assessment - 11/09/15 0947    Subjective His L hip was worse yesterday and could hardly walk. This morning pt could not hardly bend over to pick up his pants from the floor. Also had a headache yesterday on his L side and today it feels numb (L side of his head, temoral area).  Wife said that he had and x-ray for his hip which did not reveal anything.  The lumbar x-ray revealed degeneration of his lower spine. Pt also states that his great toe on his L side feels like it tucks under causing him to trip.  L hip pain was 8-9/10 yesterday. Took extra strength  Tylenol yesterday which was not helping.   Pt wife states that there were lumps in L back that looked like a goose egg around April or May 2017. His doctor said that it was nothing to worry about and it went away.    Patient is accompained by: Family member  Pt wife.   Pertinent History L hip pain, gait instability. L hip has been bothering him for the past 3 months. Had an x-ray for his L hip which was negative for abnormalities. Bought a new mattress which helped temporarily. Went to the PA who thinks pain is coming from his lower back. Did some walking in the driveway which bothered him. Pain bothers him more in the morning but continuously bothers him throughout the day. Pt adds that since his L hip started bothering him more, he feels like he is dragging his L foot more and stumbling on his toes. Had a couple of falls for the past 6 month, both were in the yard. Pt states that he stumbled and fell. Pt states difficulty stopping his forward movement which causes him to fall. Pt also states that yesterday, when he was in a squat position, and fell backwards because he could not get up.  Pt also states  having a CVA in 2004 which resulted in L LE weakness and still has weakness since then. Feels like the L LE giving way has worsened.   Pt states having loss of bowel control at times. Pt states that his doctor does not know about that. Pt states that he has had that since his stroke. Pt wife also states that pt had a knot come up in his back in January 2017 and his doctor (Dr Larwance SachsBabaoff) said that it was nothing to worry about. Pt and wife state that the knot has gone down but pt states there is something still slightly there. Pt denies saddle anesthesia. Pt denies personal history of CA.    Patient Stated Goals "For it (L hip) to get better."   Currently in Pain? Yes   Pain Score 3   2-3/10 sitting; 5-6/10 when walking.                             Objectives    There-ex   Directed patient with seated trunk flexion 3 x 30 seconds   Standing R shoulder adduction resisting yellow band 10x  Then 10x2 with 5 second holds. Increased discomfort which eases with rest.   Gait with SPC on R 5 ft, tripped on L foot. Pt also unsteady. Not recommended for pt use at home.   Gait with rw about 75 ft. Cues for foot clearance. Decreased back pain when using rw for walking. Recommended pt to use his walker for about 2 weeks to help decrease L lateral lean during gait and decrease L low back/hip pain. Also reviewed proper rw and cane height. Pt and wife verbalized understanding.  Increased time secondary to listening to pt and wife concerns to low back and L LE. Pt and wife education on low back, decreased intervertebral foraminal space with lateral lean, and sciatic symptoms as possible contributing factors to his L LE symptoms.   Pt and wife were recommended to contact his doctor if the numbness at the L side of his head does not get better. Pt wife verbalized understanding.    Improved exercise technique, movement at target joints, use of target muscles after mod verbal, visual, tactile cues.        Manual therapy  TTP L lumbar paraspinals; increased muscle tension   Soft tissue mobilization to L lumbar paraspinals in R S/L. Decreased tension.       Decreased L lumbar paraspinal muscle tension after manual therapy. No decrease in L low back/hip pain after manual therapy and there-ex overall but slight decrease in symptoms when walking using the rw to decrease L lateral lean. SPC not recommended secondary to unsteadiness and pt tripping on his L foot. Pt was recommended to use his rw at home for the next 2 weeks to help decrease pain with walking and improve safety. Pt and wife verbalized understanding.         PT Education - 11/09/15 1256    Education provided Yes   Education Details ther-ex, rw   Person(s) Educated  Patient;Spouse   Methods Explanation;Demonstration;Tactile cues;Verbal cues   Comprehension Returned demonstration;Verbalized understanding             PT Long Term Goals - 10/27/15 2005      PT LONG TERM GOAL #1   Title Patient will have a decrease in L hip pain to 5/10 or less at worst to promote ability to ambulate and lift items.  Baseline 9/10 L hip pain at worst   Time 8   Period Weeks   Status New     PT LONG TERM GOAL #2   Title Patient will improve bilateral glute med strength by at least 1/2 MMT grade to promote ability to ambulate with less L hip pain.    Time 8   Period Weeks   Status New     PT LONG TERM GOAL #3   Title Patient will improve his TUG time to 14 seconds or less to promote a decrease in fall risk.    Baseline TUG time: 19 seconds   Time 8   Period Weeks   Status New     PT LONG TERM GOAL #4   Title Patient will improve his Modified Oswestry Low Back Pain Disability Questionnaire score by at least 10% as a demonstration of improved function.    Baseline 46%   Time 8   Period Weeks   Status New               Plan - 11/09/15 0950    Clinical Impression Statement Decreased L lumbar paraspinal muscle tension after manual therapy. No decrease in L low back/hip pain after manual therapy and there-ex overall but slight decrease in symptoms when walking using the rw to decrease L lateral lean. SPC not recommended secondary to unsteadiness and pt tripping on his L foot. Pt was recommended to use his rw at home for the next 2 weeks to help decrease pain with walking and improve safety. Pt and wife verbalized understanding.    Rehab Potential Fair   Clinical Impairments Affecting Rehab Potential Hx of CVA, multiple health problems, weakness   PT Frequency 2x / week   PT Duration 8 weeks   PT Treatment/Interventions Therapeutic exercise;Therapeutic activities;Manual techniques;Aquatic Therapy;Balance training;Functional mobility training;Gait  training;Neuromuscular re-education;Patient/family education;Dry needling;Iontophoresis 4mg /ml Dexamethasone   PT Next Visit Plan Hip and trunk strengthening, gait training, balance, lumbopelvic control   Consulted and Agree with Plan of Care Patient      Patient will benefit from skilled therapeutic intervention in order to improve the following deficits and impairments:  Pain, Abnormal gait, Decreased balance, Decreased endurance, Difficulty walking, Decreased strength, Improper body mechanics, Postural dysfunction  Visit Diagnosis: Pain in left hip  Left low back pain, with sciatica presence unspecified  Difficulty in walking, not elsewhere classified  Muscle weakness (generalized)     Problem List Patient Active Problem List   Diagnosis Date Noted  . CVA (cerebral vascular accident) (HCC)   . CAD 03/01/2008  . CORONARY ARTERY DISEASE, S/P PTCA 03/01/2008  . CVA 03/01/2008    Loralyn Freshwater PT, DPT    11/09/2015, 12:59 PM  [facility] PHYS[facility] AND SPORTS MEDICINE [address] [place], [address] Phone: [phone]   Fax:  [phone]  Name: Grant Sparks MRN: [id number] Date of Birth: 12-07-1945

## 2015-11-15 ENCOUNTER — Ambulatory Visit: Payer: Medicare Other

## 2015-11-15 DIAGNOSIS — R262 Difficulty in walking, not elsewhere classified: Secondary | ICD-10-CM

## 2015-11-15 DIAGNOSIS — M25552 Pain in left hip: Secondary | ICD-10-CM

## 2015-11-15 DIAGNOSIS — M6281 Muscle weakness (generalized): Secondary | ICD-10-CM

## 2015-11-15 DIAGNOSIS — M545 Low back pain: Secondary | ICD-10-CM

## 2015-11-15 NOTE — Therapy (Signed)
[facility] PH[facility] MEDICINE [address] [place], [place], [address] Phone: [phone]   Fax:  [phone]  Physical Therapy Treatment  Patient Details  Name: Grant Sparks MRN: [id number] Date of Birth: 1945/12/08 Referring Provider: Glorious Peachanielle Ann Carter, PA  Encounter Date: 11/15/2015      PT End of Session - 11/15/15 0949    Visit Number 4   Number of Visits 17   Date for PT Re-Evaluation 12/22/15   Authorization Type 4   Authorization Time Period of 10 Medicare   PT Start Time [phone]   PT Stop Time 1034   PT Time Calculation (min) 45 min   Activity Tolerance Patient tolerated treatment well   Behavior During Therapy [facility] for tasks assessed/performed      Past Medical History:  Diagnosis Date  . CAD (coronary artery disease)    S/P stenting of the RCA x2 and PTCA of a PDA lesion in Jan 2004. Pt had a myoracidal infarction when he was in rehabilitation following a CVA.  [name]. CVA (cerebral vascular accident) (HCC)    Right  CVA, left hemiparesis  . Diabetes mellitus without complication (HCC)   . MI (myocardial infarction) [facility](HCC)     Past Surgical History:  Procedure Laterality Date  . APPENDECTOMY    . CATARACT EXTRACTION    . CERVICAL SPINE SURGERY    . coronary artery stent placement     2004    There were no vitals filed for this visit.      Subjective Assessment - 11/15/15 0951    Subjective L hip feels about the same as usual. 6-7/10 L hip pain at the moment. Difficulty getting around in the morning. Gets better during the day. Pt wife states getting a walker. Pt states that it maybe helped get pressure off his L hip.    Patient is accompained by: Family member  Pt wife.   Pertinent History L hip pain, gait instability. L hip has been bothering him for the past 3 months. Had an x-ray for his L hip which was negative for abnormalities. Bought a new mattress which helped temporarily. Went to the PA who thinks pain  is coming from his lower back. Did some walking in the driveway which bothered him. Pain bothers him more in the morning but continuously bothers him throughout the day. Pt adds that since his L hip started bothering him more, he feels like he is dragging his L foot more and stumbling on his toes. Had a couple of falls for the past 6 month, both were in the yard. Pt states that he stumbled and fell. Pt states difficulty stopping his forward movement which causes him to fall. Pt also states that yesterday, when he was in a squat position, and fell backwards because he could not get up.  Pt also states having a CVA in 2004 which resulted in L LE weakness and still has weakness since then. Feels like the L LE giving way has worsened.   Pt states having loss of bowel control at times. Pt states that his doctor does not know about that. Pt states that he has had that since his stroke. Pt wife also states that pt had a knot come up in his back in January 2017 and his doctor (Dr Larwance SachsBabaoff) said that it was nothing to worry about. Pt and wife state that the knot has gone down but pt states there is something still slightly there. Pt denies  saddle anesthesia. Pt denies personal history of CA.    Patient Stated Goals "For it (L hip) to get better."                    Objective  Pt observed not using rw  There-ex  Directed patient with seated trunk flexion using physioball 10x5 seconds for 2 sets  Then to the R 10x 5 seconds for 2 sets  Decreased L low back/L hip pain   Seated bilateral shoulder extension resisting yellow band 10x2 seconds  Then with scapular retraction 10x2 with 5 seconds to promote thoracic extension and decrease lumbar extensoin  SLS with R foot on stair step 10x5 seconds with bilateral UE assist   For 3 sets with glute max squeeze, emphasis on level pelvis.   Standing on L LE with R hip flexion, emphasis on level pelvis 10x3 with one UE assist  Reviewed HEP. Please see  patient instructions. Pt and wife verbalized understanding.     Improved exercise technique, movement at target joints, use of target muscles after mod verbal, visual, tactile cues.      6-7/10 L hip pain after session. Still demonstrates diffiuclty with lumbopelvic control with gait. Decreased sympotoms with trunk flexion exercises.             PT Education - 11/15/15 1021    Education provided Yes   Education Details ther-ex, HEP   Person(s) Educated Patient;Spouse   Methods Explanation;Demonstration;Verbal cues;Tactile cues;Handout   Comprehension Returned demonstration;Verbalized understanding             PT Long Term Goals - 10/27/15 2005      PT LONG TERM GOAL #1   Title Patient will have a decrease in L hip pain to 5/10 or less at worst to promote ability to ambulate and lift items.    Baseline 9/10 L hip pain at worst   Time 8   Period Weeks   Status New     PT LONG TERM GOAL #2   Title Patient will improve bilateral glute med strength by at least 1/2 MMT grade to promote ability to ambulate with less L hip pain.    Time 8   Period Weeks   Status New     PT LONG TERM GOAL #3   Title Patient will improve his TUG time to 14 seconds or less to promote a decrease in fall risk.    Baseline TUG time: 19 seconds   Time 8   Period Weeks   Status New     PT LONG TERM GOAL #4   Title Patient will improve his Modified Oswestry Low Back Pain Disability Questionnaire score by at least 10% as a demonstration of improved function.    Baseline 46%   Time 8   Period Weeks   Status New               Plan - 11/15/15 1022    Clinical Impression Statement 6-7/10 L hip pain after session. Still demonstrates difficulty with lumbopelvic control with gait. Decreased sympotoms with trunk flexion exercises.   Rehab Potential Fair   Clinical Impairments Affecting Rehab Potential Hx of CVA, multiple health problems, weakness   PT Frequency 2x / week   PT Duration  8 weeks   PT Treatment/Interventions Therapeutic exercise;Therapeutic activities;Manual techniques;Aquatic Therapy;Balance training;Functional mobility training;Gait training;Neuromuscular re-education;Patient/family education;Dry needling;Iontophoresis 4mg /ml Dexamethasone   PT Next Visit Plan Hip and trunk strengthening, gait training, balance, lumbopelvic control   Consulted and  Agree with Plan of Care Patient      Patient will benefit from skilled therapeutic intervention in order to improve the following deficits and impairments:  Pain, Abnormal gait, Decreased balance, Decreased endurance, Difficulty walking, Decreased strength, Improper body mechanics, Postural dysfunction  Visit Diagnosis: Pain in left hip  Left low back pain, with sciatica presence unspecified  Difficulty in walking, not elsewhere classified  Muscle weakness (generalized)     Problem List Patient Active Problem List   Diagnosis Date Noted  . CVA (cerebral vascular accident) (HCC)   . CAD 03/01/2008  . CORONARY ARTERY DISEASE, S/P PTCA 03/01/2008  . CVA 03/01/2008    Loralyn Freshwater PT, DPT   11/15/2015, 12:59 PM  [facility] PHYSICAL AND SPORTS MEDICINE [address] [place], [address] Phone: [phone]   Fax:  [phone]  Name: Grant Sparks MRN: [id number] Date of Birth: Jun 06, 1945

## 2015-11-15 NOTE — Patient Instructions (Addendum)
   Sitting on a chair:   Lean forward and rest your forearms onto your lap.   Hold for 5 seconds.    Repeat 10 times   Perform 3 sets     Then lean forward to the right, resting your forearms onto your lap.    Hold for 5 seconds   Repeat 10 times,    Perform 3 sets.           Standing holding onto something sturdy for support:   Place one foot onto the 1st step.   Squeeze your rear end muscles together.    Hold for 5 seconds.    Repeat 10 times.   Perform 3 sets daily.      Repeat for your other side.     Pt was also recommended to practice walking with his rolling walker: squeeze rear-end whenever he is on his L foot.

## 2015-11-22 ENCOUNTER — Ambulatory Visit: Payer: Medicare Other

## 2015-11-22 DIAGNOSIS — M6281 Muscle weakness (generalized): Secondary | ICD-10-CM

## 2015-11-22 DIAGNOSIS — M545 Low back pain: Secondary | ICD-10-CM

## 2015-11-22 DIAGNOSIS — M25552 Pain in left hip: Secondary | ICD-10-CM | POA: Diagnosis not present

## 2015-11-22 DIAGNOSIS — R262 Difficulty in walking, not elsewhere classified: Secondary | ICD-10-CM

## 2015-11-22 NOTE — Therapy (Signed)
[facility] [address] [place], [place], [address] Phone: [phone]   Fax:  [phone]  Physical Therapy Treatment  Patient Details  Name: Grant Sparks MRN: [id number] Date of Birth: March 17, 1945 Referring Provider: Glorious Peachanielle Ann Carter, PA  Encounter Date: 11/22/2015      PT End of Session - 11/22/15 1051    Visit Number 5   Number of Visits 17   Date for PT Re-Evaluation 12/22/15   Authorization Type 5   Authorization Time Period of 10 Medicare   PT Start Time 1051   PT Stop Time 1142   PT Time Calculation (min) 51 min   Activity Tolerance Patient tolerated treatment well   Behavior During Therapy [facility] for tasks assessed/performed      Past Medical History:  Diagnosis Date  . CAD (coronary artery disease)    S/P stenting of the RCA x2 and PTCA of a PDA lesion in Jan 2004. Pt had a myoracidal infarction when he was in rehabilitation following a CVA.  [name]. CVA (cerebral vascular accident) (HCC)    Right  CVA, left hemiparesis  . Diabetes mellitus without complication (HCC)   . MI (myocardial infarction) [facility](HCC)     Past Surgical History:  Procedure Laterality Date  . APPENDECTOMY    . CATARACT EXTRACTION    . CERVICAL SPINE SURGERY    . coronary artery stent placement     2004    There were no vitals filed for this visit.      Subjective Assessment - 11/22/15 1054    Subjective Pt states that he does not feel like he is walking any better than when he first started. Fell twice yesterday and hit across the top of his head and hit his R arm. No headaches, 5-6/10 L hip pain currently.  Next appointment with his MD is 11/28/2015 for blood work and the following week is his regular check up (12/05/2015).  Only used the rw a little bit but not much because he did not want to scratch the hardwood floors. Got tennis balls recently to help with that. Pt was at the barn trying move some plywood (4x8 sheet), trying  to put it on his tractor and it slipped. Pt fell and hit his head and R arm at his barb wire gate.  Injury just scratched the surface.    Patient is accompained by: Family member  Pt wife.   Pertinent History L hip pain, gait instability. L hip has been bothering him for the past 3 months. Had an x-ray for his L hip which was negative for abnormalities. Bought a new mattress which helped temporarily. Went to the PA who thinks pain is coming from his lower back. Did some walking in the driveway which bothered him. Pain bothers him more in the morning but continuously bothers him throughout the day. Pt adds that since his L hip started bothering him more, he feels like he is dragging his L foot more and stumbling on his toes. Had a couple of falls for the past 6 month, both were in the yard. Pt states that he stumbled and fell. Pt states difficulty stopping his forward movement which causes him to fall. Pt also states that yesterday, when he was in a squat position, and fell backwards because he could not get up.  Pt also states having a CVA in 2004 which resulted in L LE weakness and still has weakness since then. Feels like the  L LE giving way has worsened.   Pt states having loss of bowel control at times. Pt states that his doctor does not know about that. Pt states that he has had that since his stroke. Pt wife also states that pt had a knot come up in his back in January 2017 and his doctor (Dr Larwance Sachs) said that it was nothing to worry about. Pt and wife state that the knot has gone down but pt states there is something still slightly there. Pt denies saddle anesthesia. Pt denies personal history of CA.    Patient Stated Goals "For it (L hip) to get better."   Currently in Pain? Yes   Pain Score 6   5-6/10    Pain Orientation Left     Pt also adds that he and his wife bought a firm mattress before and lays on his back (the mattress that helped temporarily).  Next MD appointment is in 12/05/2015.         Objectives   No TTP L lateral, posterior hip in standing.   Reproduction of L posterior hip pain with figure 4 test L hip in supine     There-ex    Standing R trunk side bend increases L posterior hip pain  standing L trunk side bend increases L posterior hip pain more  Seated: L trunk side bending increases L hip pulling    R trunk side bending increases L hip pulling     Seated trunk flexion 8x10 seconds  Gait x 8 ft. Pt states increased L hip pain with L LE swing phase   Supine L hip PROM: flexion no pain    Abduction: L adductor muscle pulling   Supine R trunk side bend: reproduced L posterior hip pulling Supine L trunk side bend: R lateral hip pulling.    TTP L posterior hip and proximal thigh and around L piriformis muscle area   S/L L hip extension PROM 10x2. Increased L posterior hip pulling, decreases with rest.   S/L L hip clamshell 6x. Slight L posterior hip discomfort   Sitting on chair with lumbar towel roll targeting L low back x 1 min. No change  Seated hip adduction pillow squeeze 10x5 seconds. No change  Seated trunk flexion to the R.     Plan: continue PT for L hip strengthening until next MD visit.    Pt was recommended to continue using rw for safety. Pt verbalized understanding.     Improved exercise technique, movement at target joints, use of target muscles after mod verbal, visual, tactile cues.       Reproduction of L hip symptoms with L and R trunk side bending, L hip extension to end range, with possible L piriformis muscle involvement. Pt still demonstrates difficulty with walking with unsteadiness. Pt was recommended to use a rw for now for safety and decrease risk for falls. Current plan of care is to work on L LE strengthening as tolerated to promote ability to ambulate, improve balance until his next MD visit on 12/05/2015.                     PT Education - 11/22/15 1839    Education provided  Yes   Education Details ther-ex   [employer]) Educated Patient   Methods Explanation;Demonstration;Tactile cues;Verbal cues   Comprehension Verbalized understanding;Returned demonstration             PT Long Term Goals - 10/27/15 2005  PT LONG TERM GOAL #1   Title Patient will have a decrease in L hip pain to 5/10 or less at worst to promote ability to ambulate and lift items.    Baseline 9/10 L hip pain at worst   Time 8   Period Weeks   Status New     PT LONG TERM GOAL #2   Title Patient will improve bilateral glute med strength by at least 1/2 MMT grade to promote ability to ambulate with less L hip pain.    Time 8   Period Weeks   Status New     PT LONG TERM GOAL #3   Title Patient will improve his TUG time to 14 seconds or less to promote a decrease in fall risk.    Baseline TUG time: 19 seconds   Time 8   Period Weeks   Status New     PT LONG TERM GOAL #4   Title Patient will improve his Modified Oswestry Low Back Pain Disability Questionnaire score by at least 10% as a demonstration of improved function.    Baseline 46%   Time 8   Period Weeks   Status New               Plan - 11/22/15 1848    Clinical Impression Statement Reproduction of L hip symptoms with L and R trunk side bending, L hip extension to end range, with possible L piriformis muscle involvement. Pt still demonstrates difficulty with walking with unsteadiness. Pt was recommended to use a rw for now for safety and decrease risk for falls. Current plan of care is to work on L LE strengthening as tolerated to promote ability to ambulate, improve balance until his next MD visit on 12/05/2015.    Rehab Potential Fair   Clinical Impairments Affecting Rehab Potential Hx of CVA, multiple health problems, weakness   PT Frequency 2x / week   PT Duration 8 weeks   PT Treatment/Interventions Therapeutic exercise;Therapeutic activities;Manual techniques;Aquatic Therapy;Balance training;Functional  mobility training;Gait training;Neuromuscular re-education;Patient/family education;Dry needling;Iontophoresis 4mg /ml Dexamethasone   PT Next Visit Plan Hip and trunk strengthening, gait training, balance, lumbopelvic control   Consulted and Agree with Plan of Care Patient      Patient will benefit from skilled therapeutic intervention in order to improve the following deficits and impairments:  Pain, Abnormal gait, Decreased balance, Decreased endurance, Difficulty walking, Decreased strength, Improper body mechanics, Postural dysfunction  Visit Diagnosis: Pain in left hip  Left low back pain, with sciatica presence unspecified  Difficulty in walking, not elsewhere classified  Muscle weakness (generalized)     Problem List Patient Active Problem List   Diagnosis Date Noted  . CVA (cerebral vascular accident) (HCC)   . CAD 03/01/2008  . CORONARY ARTERY DISEASE, S/P PTCA 03/01/2008  . CVA 03/01/2008    Loralyn Freshwater PT, DPT   11/22/2015, 7:07 PM  [name] PHYSICAL AND SPORTS MEDICINE [address] [place], [address] Phone: [phone]   Fax:  [phone]  Name: Grant Sparks MRN: [id number] Date of Birth: January 22, 1946

## 2015-11-29 ENCOUNTER — Ambulatory Visit: Payer: Medicare Other | Attending: Student

## 2015-11-29 DIAGNOSIS — M6281 Muscle weakness (generalized): Secondary | ICD-10-CM | POA: Diagnosis present

## 2015-11-29 DIAGNOSIS — M25552 Pain in left hip: Secondary | ICD-10-CM | POA: Insufficient documentation

## 2015-11-29 DIAGNOSIS — R262 Difficulty in walking, not elsewhere classified: Secondary | ICD-10-CM | POA: Diagnosis present

## 2015-11-29 NOTE — Therapy (Signed)
[facility] [address] [place], [address] Phone: [phone]   Fax:  [phone]  Physical Therapy Treatment  Patient Details  Name: Grant Sparks MRN: [id number] Date of Birth: [birth year] Referring Provider: Glorious Peach, PA  Encounter Date: 11/29/2015      PT End of Session - 11/29/15 0932    Visit Number 6   Number of Visits 17   Date for PT Re-Evaluation 12/22/15   Authorization Type 6   Authorization Time Period of 10 Medicare   PT Start Time 0932   PT Stop Time 1020   PT Time Calculation (min) 48 min   Activity Tolerance Patient tolerated treatment well   Behavior During Therapy [facility] for tasks assessed/performed      Past Medical History:  Diagnosis Date  . CAD (coronary artery disease)    S/P stenting of the RCA x2 and PTCA of a PDA lesion in Jan 2004. Pt had a myoracidal infarction when he was in rehabilitation following a CVA.  [name] CVA (cerebral vascular accident) (HCC)    Right  CVA, left hemiparesis  . Diabetes mellitus without complication (HCC)   . MI (myocardial infarction)     Past Surgical History:  Procedure Laterality Date  . APPENDECTOMY    . CATARACT EXTRACTION    . CERVICAL SPINE SURGERY    . coronary artery stent placement     2004    There were no vitals filed for this visit.      Subjective Assessment - 11/29/15 0946    Subjective No falls since the last time. L hip is alright. No pain or discomfort currently.    Patient is accompained by: Family member  Pt wife.   Pertinent History L hip pain, gait instability. L hip has been bothering him for the past 3 months. Had an x-ray for his L hip which was negative for abnormalities. Bought a new mattress which helped temporarily. Went to the PA who thinks pain is coming from his lower back. Did some walking in the driveway which bothered him. Pain bothers him more in the morning but continuously bothers him  throughout the day. Pt adds that since his L hip started bothering him more, he feels like he is dragging his L foot more and stumbling on his toes. Had a couple of falls for the past 6 month, both were in the yard. Pt states that he stumbled and fell. Pt states difficulty stopping his forward movement which causes him to fall. Pt also states that yesterday, when he was in a squat position, and fell backwards because he could not get up.  Pt also states having a CVA in 2004 which resulted in L LE weakness and still has weakness since then. Feels like the L LE giving way has worsened.   Pt states having loss of bowel control at times. Pt states that his doctor does not know about that. Pt states that he has had that since his stroke. Pt wife also states that pt had a knot come up in his back in January 2017 and his doctor (Dr Larwance Sachs) said that it was nothing to worry about. Pt and wife state that the knot has gone down but pt states there is something still slightly there. Pt denies saddle anesthesia. Pt denies personal history of CA.    Patient Stated Goals "For it (L hip) to get better."   Currently in Pain? No/denies   Pain  Score 0-No pain                                 PT Education - 11/29/15 1033    Education provided Yes   Education Details ther-ex, HEP   Person(s) Educated Patient   Methods Explanation;Demonstration;Tactile cues;Verbal cues   Comprehension Returned demonstration;Verbalized understanding       Objectives  There-ex  Directed patient with sit <> stand from elevated mat table 5x3 with cues to place and maintain his center of gravity over his base of support.   Leg press seat 0, plate 25 for 3x5 for LE strengthening.  Standing hip machine: abduction plate 10 for 2x5 L LE  standing bilateral shoulder extension resisting yellow band 10x  Then resisting red band 10x3 to promote thoracic extension and trunk muscle use.  Standing mini squats with  bilateral UE assist 10x2  Standing hip abduction with bilateral UE assist 10x2 each LE  Seated ankle DF 10x2 for L LE  Side stepping 5 ft to the L and 5 ft to the R with bilateral UE assist   Reviewed HEP. Please see pt instructions. Pt demonstrated and verbalized understanding.    Improved exercise technique, movement at target joints, use of target muscles after mod verbal, visual, tactile cues.     Slight L hip discomfort with standing hip abduction exercise at the machine which eased off with rest. Otherwise no complain of increased L LE pain throughout session. Pt came to clinic without use of rw. Pt observed however to be walking more stable today though still unsteady compared to previous sessions. Emphasized LE strengthening today to promote balance with standing activities and with gait.          PT Long Term Goals - 10/27/15 2005      PT LONG TERM GOAL #1   Title Patient will have a decrease in L hip pain to 5/10 or less at worst to promote ability to ambulate and lift items.    Baseline 9/10 L hip pain at worst   Time 8   Period Weeks   Status New     PT LONG TERM GOAL #2   Title Patient will improve bilateral glute med strength by at least 1/2 MMT grade to promote ability to ambulate with less L hip pain.    Time 8   Period Weeks   Status New     PT LONG TERM GOAL #3   Title Patient will improve his TUG time to 14 seconds or less to promote a decrease in fall risk.    Baseline TUG time: 19 seconds   Time 8   Period Weeks   Status New     PT LONG TERM GOAL #4   Title Patient will improve his Modified Oswestry Low Back Pain Disability Questionnaire score by at least 10% as a demonstration of improved function.    Baseline 46%   Time 8   Period Weeks   Status New               Plan - 11/29/15 1033    Clinical Impression Statement Slight L hip discomfort with standing hip abduction exercise at the machine which eased off with rest. Otherwise no  complain of increased L LE pain throughout session. Pt came to clinic without use of rw. Pt observed however to be walking more stable today though still unsteady compared to previous sessions. Emphasized  LE strengthening today to promote balance with standing activities and with gait.    Rehab Potential Fair   Clinical Impairments Affecting Rehab Potential Hx of CVA, multiple health problems, weakness   PT Frequency 2x / week   PT Duration 8 weeks   PT Treatment/Interventions Therapeutic exercise;Therapeutic activities;Manual techniques;Aquatic Therapy;Balance training;Functional mobility training;Gait training;Neuromuscular re-education;Patient/family education;Dry needling;Iontophoresis 4mg /ml Dexamethasone   PT Next Visit Plan Hip and trunk strengthening, gait training, balance, lumbopelvic control   Consulted and Agree with Plan of Care Patient      Patient will benefit from skilled therapeutic intervention in order to improve the following deficits and impairments:  Pain, Abnormal gait, Decreased balance, Decreased endurance, Difficulty walking, Decreased strength, Improper body mechanics, Postural dysfunction  Visit Diagnosis: Pain in left hip  Difficulty in walking, not elsewhere classified  Muscle weakness (generalized)     Problem List Patient Active Problem List   Diagnosis Date Noted  . CVA (cerebral vascular accident) (HCC)   . CAD 03/01/2008  . CORONARY ARTERY DISEASE, S/P PTCA 03/01/2008  . CVA 03/01/2008    Loralyn FreshwaterMiguel Nameer Summer PT, DPT   11/29/2015, 10:38 AM   [facility] [address] [place], [place], [address] Phone: [phone]   Fax:  [phone]  Name: Grant Sparks MRN: [id number] Date of Birth: Nov 19, 1945

## 2015-11-29 NOTE — Patient Instructions (Addendum)
Mini-Squats (Standing)    You can keep a chair behind you if you need to sit and rest as well as for safety.   Stand with support for safety. Bend knees slightly. Tighten pelvic floor.  Return to straight standing.  Repeat _10__ times. Do _3__ times a day.  Copyright  VHI. All rights reserved.     ABDUCTION: Standing (Active)    Holding onto something sturdy for support and safety:  Stand, feet flat. Lift left leg out to side. No weights. Complete __2_ sets of _10__ repetitions. Perform _2__ sessions per day.  http://gtsc.exer.us/110   Copyright  VHI. All rights reserved.    Ankle Bend    Sitting straight, attempt to bend ankles by lifting toes off floor.   Repeat _10___ times. Do __2__ sessions per day.  http://gt2.exer.us/469   Copyright  VHI. All rights reserved.

## 2015-12-01 ENCOUNTER — Ambulatory Visit: Payer: Medicare Other

## 2015-12-01 DIAGNOSIS — M25552 Pain in left hip: Secondary | ICD-10-CM

## 2015-12-01 DIAGNOSIS — M6281 Muscle weakness (generalized): Secondary | ICD-10-CM

## 2015-12-01 DIAGNOSIS — R262 Difficulty in walking, not elsewhere classified: Secondary | ICD-10-CM

## 2015-12-01 NOTE — Therapy (Signed)
[facility] [address] [place], [address] Phone: [phone]   Fax:  [phone]  Physical Therapy Treatment And Progress Report   Patient Details  Name: Grant Sparks MRN: [id number] Date of Birth: 07/27/1945 Referring Provider: Glorious Peach, PA  Encounter Date: 12/01/2015      PT End of Session - 12/01/15 0934    Visit Number 7   Number of Visits 17   Date for PT Re-Evaluation 12/22/15   Authorization Type 1   Authorization Time Period of 10 Medicare   PT Start Time [phone]   PT Stop Time 1018   PT Time Calculation (min) 44 min   Activity Tolerance Patient tolerated treatment well   Behavior During Therapy [facility] for tasks assessed/performed      Past Medical History:  Diagnosis Date  . CAD (coronary artery disease)    S/P stenting of the RCA x2 and PTCA of a PDA lesion in Jan 2004. Pt had a myoracidal infarction when he was in rehabilitation following a CVA.  [name] CVA (cerebral vascular accident) (HCC)    Right  CVA, left hemiparesis  . Diabetes mellitus without complication (HCC)   . MI (myocardial infarction)     Past Surgical History:  Procedure Laterality Date  . APPENDECTOMY    . CATARACT EXTRACTION    . CERVICAL SPINE SURGERY    . coronary artery stent placement     2004    There were no vitals filed for this visit.      Subjective Assessment - 12/01/15 0943    Subjective Pt states that he is better able to perform the seated ankle DF exercise when his leg is in slight extension compared to when his knee is more bent.  No falls recently.   No L hip pain currently (sitting at end of session) and when walking around the gym.  3-4/10 L hip pain at most for the past 5 days. L hip has not bothered him as much as before.    Patient is accompained by: Family member  Pt wife.   Pertinent History L hip pain, gait instability. L hip has been bothering him for the past 3 months. Had an  x-ray for his L hip which was negative for abnormalities. Bought a new mattress which helped temporarily. Went to the PA who thinks pain is coming from his lower back. Did some walking in the driveway which bothered him. Pain bothers him more in the morning but continuously bothers him throughout the day. Pt adds that since his L hip started bothering him more, he feels like he is dragging his L foot more and stumbling on his toes. Had a couple of falls for the past 6 month, both were in the yard. Pt states that he stumbled and fell. Pt states difficulty stopping his forward movement which causes him to fall. Pt also states that yesterday, when he was in a squat position, and fell backwards because he could not get up.  Pt also states having a CVA in 2004 which resulted in L LE weakness and still has weakness since then. Feels like the L LE giving way has worsened.   Pt states having loss of bowel control at times. Pt states that his doctor does not know about that. Pt states that he has had that since his stroke. Pt wife also states that pt had a knot come up in his back in January 2017 and his  doctor (Dr Larwance SachsBabaoff) said that it was nothing to worry about. Pt and wife state that the knot has gone down but pt states there is something still slightly there. Pt denies saddle anesthesia. Pt denies personal history of CA.    Patient Stated Goals "For it (L hip) to get better."   Currently in Pain? No/denies   Pain Score 0-No pain            [facility] PT Assessment - 12/01/15 0951      Assessment   Next MD Visit 12/05/2015     Observation/Other Assessments   Observations TUG without AD: 15 seconds, 12 seconds, 13 seconds.   13 seconds average.    Modified Oswertry 16%     Strength   Right Hip ABduction 4/5   Left Hip ABduction 4-/5                             PT Education - 12/01/15 1215    Education provided Yes   Education Details ther-ex, progress/current status   Person(s)  Educated Patient;Spouse  spouse present   Methods Explanation;Demonstration;Tactile cues;Verbal cues   Comprehension Returned demonstration;Verbalized understanding        Objectives  There-ex  Directed patient with seated ankle DF 10x3 with 2-5 second holds, with towel assist to end range for L LE. Reviewed and given as part of his HEP. Pt demonstrated and verbalized understanding.   Pt education on ankle angle, and AROM strength vs PROM with strength.  sit <> stand regular chair with arms (lower surface compared to previous session) 5x3 with cues to place and maintain his center of gravity over his base of support. Better able to perform without pushing the back of his knees against the surface he is sitting on.    Standing up from a chair, walking 10 ft forward, then returning 10 ft, then sitting back onto chair 3x  15 seconds, 12 seconds, 13 seconds. 13 seconds average.   S/L manually resisted hip abduction 1-2x each way for each LE  Reviewed progress/current status with glute med strength with pt  Standing hip abduction with bilateral UE assist 10x2 each LE  Forward step up onto Air Ex pad with L LE, R UE assist, CGA by PT 10x2  Improved exercise technique, movement at target joints, use of target muscles after mod verbal, visual, tactile cues.     Patient demonstrates decreased L hip pain level overall for the past 5 days, improved function, and improved TUG times since initial evaluation. Pt is also better able to stand up from a chair and though still unsteady, pt is observed to walk with less difficulty and with improved steadiness compared to previous sessions. Pt still demonstrates L LE weakness, L hip pain, and difficulty with walking and performing standing tasks and would benefit from continued skilled physical therapy services to address the aforementioned deficits.         PT Long Term Goals - 12/01/15 1216      PT LONG TERM GOAL #1   Title Patient will have  a decrease in L hip pain to 5/10 or less at worst to promote ability to ambulate and lift items.    Baseline 9/10 L hip pain at worst; 3-4/10 L hip pain at worst for the past 5 days (12/01/2015)   Time 8   Period Weeks   Status Achieved     PT LONG TERM GOAL #2  Title Patient will improve bilateral glute med strength by at least 1/2 MMT grade to promote ability to ambulate with less L hip pain.    Time 8   Period Weeks   Status On-going     PT LONG TERM GOAL #3   Title Patient will improve his TUG time to 14 seconds or less to promote a decrease in fall risk.    Baseline TUG time: 19 seconds; TUG time 13 seconds average (December 22, 2015)   Time 8   Period Weeks   Status Achieved     PT LONG TERM GOAL #4   Title Patient will improve his Modified Oswestry Low Back Pain Disability Questionnaire score by at least 10% as a demonstration of improved function.    Baseline 46%; Current score: 16% (12/22/2015)   Time 8   Period Weeks   Status Achieved     PT LONG TERM GOAL #5   Title Patient will improve his TUG time to 11 seconds or less to promote a decrease in fall risk.    Baseline 13 seconds average   Time 3   Period Weeks   Status New               Plan - 2015-12-22 1215    Clinical Impression Statement Patient demonstrates decreased L hip pain level overall for the past 5 days, improved function, and improved TUG times since initial evaluation. Pt is also better able to stand up from a chair and though still unsteady, pt is observed to walk with less difficulty and with improved steadiness compared to previous sessions. Pt still demonstrates L LE weakness, L hip pain, and difficulty with walking and performing standing tasks and would benefit from continued skilled physical therapy services to address the aforementioned deficits.    Rehab Potential Fair   Clinical Impairments Affecting Rehab Potential Hx of CVA, multiple health problems, weakness   PT Frequency 2x / week   PT  Duration 8 weeks   PT Treatment/Interventions Therapeutic exercise;Therapeutic activities;Manual techniques;Aquatic Therapy;Balance training;Functional mobility training;Gait training;Neuromuscular re-education;Patient/family education;Dry needling;Iontophoresis 4mg /ml Dexamethasone   PT Next Visit Plan Hip and trunk strengthening, gait training, balance, lumbopelvic control   Consulted and Agree with Plan of Care Patient      Patient will benefit from skilled therapeutic intervention in order to improve the following deficits and impairments:  Pain, Abnormal gait, Decreased balance, Decreased endurance, Difficulty walking, Decreased strength, Improper body mechanics, Postural dysfunction  Visit Diagnosis: Pain in left hip  Difficulty in walking, not elsewhere classified  Muscle weakness (generalized)       G-Codes - 2015-12-22 1232    Functional Assessment Tool Used Modified Oswestry Low Back Pain Disability Questionnaire, patient interview, clinical presentation   Functional Limitation Mobility: Walking and moving around   Mobility: Walking and Moving Around Current Status [phone]) At least 20 percent but less than 40 percent impaired, limited or restricted   Mobility: Walking and Moving Around Goal Status [phone]) At least 1 percent but less than 20 percent impaired, limited or restricted  Patient demonstrates potential for improvement      Problem List Patient Active Problem List   Diagnosis Date Noted  . CVA (cerebral vascular accident) (HCC)   . CAD 03/01/2008  . CORONARY ARTERY DISEASE, S/P PTCA 03/01/2008  . CVA 03/01/2008    Thank you for your referral.   Loralyn Freshwater PT, DPT   22-Dec-2015, 12:34 PM  [place] [facility] [address] [place], [place], [address] Phone: [phone]   Fax:  [phone]  Name: Grant Sparks MRN: [id number] Date of Birth: 1945/07/12

## 2015-12-06 ENCOUNTER — Ambulatory Visit: Payer: Medicare Other

## 2015-12-22 ENCOUNTER — Other Ambulatory Visit: Payer: Self-pay | Admitting: Student

## 2015-12-22 DIAGNOSIS — M5416 Radiculopathy, lumbar region: Secondary | ICD-10-CM

## 2015-12-22 DIAGNOSIS — R2689 Other abnormalities of gait and mobility: Secondary | ICD-10-CM

## 2015-12-22 DIAGNOSIS — M21379 Foot drop, unspecified foot: Secondary | ICD-10-CM

## 2016-01-02 ENCOUNTER — Ambulatory Visit
Admission: RE | Admit: 2016-01-02 | Discharge: 2016-01-02 | Disposition: A | Discharge status: Home / Self Care | Payer: Medicare Other | Source: Ambulatory Visit | Attending: Student | Admitting: Student

## 2016-01-02 DIAGNOSIS — M5136 Other intervertebral disc degeneration, lumbar region: Secondary | ICD-10-CM | POA: Insufficient documentation

## 2016-01-02 DIAGNOSIS — M2578 Osteophyte, vertebrae: Secondary | ICD-10-CM | POA: Insufficient documentation

## 2016-01-02 DIAGNOSIS — M5416 Radiculopathy, lumbar region: Secondary | ICD-10-CM | POA: Diagnosis present

## 2016-01-02 DIAGNOSIS — R2689 Other abnormalities of gait and mobility: Secondary | ICD-10-CM | POA: Diagnosis present

## 2016-01-02 DIAGNOSIS — M21379 Foot drop, unspecified foot: Secondary | ICD-10-CM

## 2016-01-26 ENCOUNTER — Other Ambulatory Visit: Payer: Self-pay | Admitting: Student

## 2016-01-26 DIAGNOSIS — M5416 Radiculopathy, lumbar region: Secondary | ICD-10-CM

## 2016-10-13 ENCOUNTER — Emergency Department (HOSPITAL_COMMUNITY): Payer: Medicare Other

## 2016-10-13 ENCOUNTER — Encounter (HOSPITAL_COMMUNITY): Payer: Self-pay | Admitting: *Deleted

## 2016-10-13 ENCOUNTER — Observation Stay (HOSPITAL_COMMUNITY)
Admission: EM | Admit: 2016-10-13 | Discharge: 2016-10-15 | Disposition: A | Discharge status: Home / Self Care | Payer: Medicare Other | Attending: Internal Medicine | Admitting: Internal Medicine

## 2016-10-13 DIAGNOSIS — D649 Anemia, unspecified: Secondary | ICD-10-CM | POA: Diagnosis not present

## 2016-10-13 DIAGNOSIS — I6330 Cerebral infarction due to thrombosis of unspecified cerebral artery: Secondary | ICD-10-CM | POA: Insufficient documentation

## 2016-10-13 DIAGNOSIS — I639 Cerebral infarction, unspecified: Secondary | ICD-10-CM | POA: Diagnosis not present

## 2016-10-13 DIAGNOSIS — N183 Chronic kidney disease, stage 3 unspecified: Secondary | ICD-10-CM

## 2016-10-13 DIAGNOSIS — Z8249 Family history of ischemic heart disease and other diseases of the circulatory system: Secondary | ICD-10-CM | POA: Diagnosis not present

## 2016-10-13 DIAGNOSIS — I672 Cerebral atherosclerosis: Secondary | ICD-10-CM | POA: Diagnosis not present

## 2016-10-13 DIAGNOSIS — I2510 Atherosclerotic heart disease of native coronary artery without angina pectoris: Secondary | ICD-10-CM | POA: Insufficient documentation

## 2016-10-13 DIAGNOSIS — I6350 Cerebral infarction due to unspecified occlusion or stenosis of unspecified cerebral artery: Secondary | ICD-10-CM | POA: Diagnosis present

## 2016-10-13 DIAGNOSIS — I129 Hypertensive chronic kidney disease with stage 1 through stage 4 chronic kidney disease, or unspecified chronic kidney disease: Secondary | ICD-10-CM | POA: Diagnosis not present

## 2016-10-13 DIAGNOSIS — I6522 Occlusion and stenosis of left carotid artery: Secondary | ICD-10-CM | POA: Insufficient documentation

## 2016-10-13 DIAGNOSIS — I252 Old myocardial infarction: Secondary | ICD-10-CM | POA: Insufficient documentation

## 2016-10-13 DIAGNOSIS — Z955 Presence of coronary angioplasty implant and graft: Secondary | ICD-10-CM | POA: Diagnosis not present

## 2016-10-13 DIAGNOSIS — Z79899 Other long term (current) drug therapy: Secondary | ICD-10-CM | POA: Diagnosis not present

## 2016-10-13 DIAGNOSIS — E1122 Type 2 diabetes mellitus with diabetic chronic kidney disease: Secondary | ICD-10-CM

## 2016-10-13 DIAGNOSIS — I69354 Hemiplegia and hemiparesis following cerebral infarction affecting left non-dominant side: Secondary | ICD-10-CM | POA: Diagnosis not present

## 2016-10-13 DIAGNOSIS — Z7982 Long term (current) use of aspirin: Secondary | ICD-10-CM | POA: Diagnosis not present

## 2016-10-13 DIAGNOSIS — Z794 Long term (current) use of insulin: Secondary | ICD-10-CM | POA: Diagnosis not present

## 2016-10-13 DIAGNOSIS — I259 Chronic ischemic heart disease, unspecified: Secondary | ICD-10-CM | POA: Diagnosis present

## 2016-10-13 DIAGNOSIS — R531 Weakness: Secondary | ICD-10-CM | POA: Diagnosis present

## 2016-10-13 DIAGNOSIS — I10 Essential (primary) hypertension: Secondary | ICD-10-CM | POA: Diagnosis not present

## 2016-10-13 DIAGNOSIS — R29810 Facial weakness: Secondary | ICD-10-CM | POA: Diagnosis present

## 2016-10-13 LAB — TROPONIN I

## 2016-10-13 LAB — URINALYSIS, ROUTINE W REFLEX MICROSCOPIC
BILIRUBIN URINE: NEGATIVE
Glucose, UA: 50 mg/dL — AB
HGB URINE DIPSTICK: NEGATIVE
KETONES UR: NEGATIVE mg/dL
Leukocytes, UA: NEGATIVE
NITRITE: NEGATIVE
PROTEIN: NEGATIVE mg/dL
SPECIFIC GRAVITY, URINE: 1.027 (ref 1.005–1.030)
pH: 5 (ref 5.0–8.0)

## 2016-10-13 LAB — CBC
HEMATOCRIT: 39.5 % (ref 39.0–52.0)
HEMOGLOBIN: 13 g/dL (ref 13.0–17.0)
MCH: 30 pg (ref 26.0–34.0)
MCHC: 32.9 g/dL (ref 30.0–36.0)
MCV: 91.2 fL (ref 78.0–100.0)
Platelets: 226 10*3/uL (ref 150–400)
RBC: 4.33 MIL/uL (ref 4.22–5.81)
RDW: 14 % (ref 11.5–15.5)
WBC: 6.5 10*3/uL (ref 4.0–10.5)

## 2016-10-13 LAB — COMPREHENSIVE METABOLIC PANEL
ALBUMIN: 3.9 g/dL (ref 3.5–5.0)
ALK PHOS: 50 U/L (ref 38–126)
ALT: 15 U/L — ABNORMAL LOW (ref 17–63)
AST: 20 U/L (ref 15–41)
Anion gap: 10 (ref 5–15)
BILIRUBIN TOTAL: 0.5 mg/dL (ref 0.3–1.2)
BUN: 21 mg/dL — AB (ref 6–20)
CO2: 20 mmol/L — ABNORMAL LOW (ref 22–32)
CREATININE: 0.89 mg/dL (ref 0.61–1.24)
Calcium: 9.1 mg/dL (ref 8.9–10.3)
Chloride: 104 mmol/L (ref 101–111)
GFR calc Af Amer: >60 mL/min (ref >60)
GFR calc non Af Amer: >60 mL/min (ref >60)
GLUCOSE: 143 mg/dL — AB (ref 65–99)
Potassium: 4 mmol/L (ref 3.5–5.1)
Sodium: 134 mmol/L — ABNORMAL LOW (ref 135–145)
TOTAL PROTEIN: 6.7 g/dL (ref 6.5–8.1)

## 2016-10-13 LAB — I-STAT CHEM 8, ED
BUN: 24 mg/dL — AB (ref 6–20)
CALCIUM ION: 1.13 mmol/L — AB (ref 1.15–1.40)
Chloride: 105 mmol/L (ref 101–111)
Creatinine, Ser: 0.9 mg/dL (ref 0.61–1.24)
Glucose, Bld: 141 mg/dL — ABNORMAL HIGH (ref 65–99)
HEMATOCRIT: 38 % — AB (ref 39.0–52.0)
Hemoglobin: 12.9 g/dL — ABNORMAL LOW (ref 13.0–17.0)
Potassium: 4 mmol/L (ref 3.5–5.1)
SODIUM: 139 mmol/L (ref 135–145)
TCO2: 23 mmol/L (ref 0–100)

## 2016-10-13 LAB — APTT: APTT: 25 s (ref 24–36)

## 2016-10-13 LAB — DIFFERENTIAL
BASOS ABS: 0 10*3/uL (ref 0.0–0.1)
Basophils Relative: 0 %
Eosinophils Absolute: 0.1 10*3/uL (ref 0.0–0.7)
Eosinophils Relative: 1 %
LYMPHS ABS: 2.4 10*3/uL (ref 0.7–4.0)
LYMPHS PCT: 38 %
Monocytes Absolute: 0.5 10*3/uL (ref 0.1–1.0)
Monocytes Relative: 8 %
NEUTROS PCT: 53 %
Neutro Abs: 3.4 10*3/uL (ref 1.7–7.7)

## 2016-10-13 LAB — PROTIME-INR
INR: 0.92
Prothrombin Time: 12.4 seconds (ref 11.4–15.2)

## 2016-10-13 MED ORDER — SODIUM CHLORIDE 0.9 % IV SOLN: Freq: Once | INTRAVENOUS | Status: DC

## 2016-10-13 MED ORDER — SODIUM CHLORIDE 0.9 % IV SOLN
INTRAVENOUS | Status: DC
  Administered 2016-10-14 – 2016-10-15 (×2): via INTRAVENOUS

## 2016-10-13 NOTE — ED Notes (Signed)
Noticeable rt facial droop  Equal grfips no leg or arm drift

## 2016-10-13 NOTE — ED Notes (Signed)
Dr. Danford at bedside  

## 2016-10-13 NOTE — ED Provider Notes (Signed)
[facility]-EMER[facility]ENCY DEPT Provider Note   CSN: [id number] Arrival date & time: 10/13/16  1703   History   Chief Complaint Chief Complaint  Patient presents with  . Weakness   HPI Grant Sparks is a 71 y.o. male.  Presented with dysphagia, dysarthria, and ataxia for 56 hours. He has a past medical history significant for stroke 14 years prior, and MI s/p stent placement 14 near that time as well, he is also an insulin dependant diabetic. He stated that he was able to complete regular work around the house until the new symptoms began. Patient states that he began choking on water the prior day after developing the slurred speech and difficulty walking on Thursday. In addition he added that he was concerned as these symptoms are new, and led to a fall this past evening in his yard. He described the ataxia as an issues with keeping himself upright after walking a distance greater than a typical room. He began to walk across his yard this evening and was able to catch himself on a tree, from there he attempted to walk into the house but fell face forward unto the grass at that time. Patient stated that his footdrop is a chronic issue, something he developed a year prior for which a brace was placed.       Past Medical History:  Diagnosis Date  . CAD (coronary artery disease)    S/P stenting of the RCA x2 and PTCA of a PDA lesion in Jan 2004. Pt had a myoracidal infarction when he was in rehabilitation following a CVA.  [name] CVA (cerebral vascular accident) (HCC)    Right  CVA, left hemiparesis  . Diabetes mellitus without complication (HCC)   . MI (myocardial infarction) [facility])     Patient Active Problem List   Diagnosis Date Noted  . CVA (cerebral vascular accident) (HCC)   . CAD 03/01/2008  . CORONARY ARTERY DISEASE, S/P PTCA 03/01/2008  . CVA 03/01/2008    Past Surgical History:  Procedure Laterality Date  . APPENDECTOMY    . CATARACT EXTRACTION    . CERVICAL SPINE SURGERY    .  coronary artery stent placement     2004       Home Medications    Prior to Admission medications   Medication Sig Start Date End Date Taking? Authorizing Provider  aspirin 81 MG tablet Take 81 mg by mouth daily.      [provider]  glipiZIDE (GLUCOTROL XL) 5 MG 24 hr tablet Take 5 mg by mouth daily. 07/04/15   [provider]  insulin detemir (LEVEMIR FLEXPEN) 100 UNIT/ML injection Inject into the skin as directed.      [provider]  metFORMIN (GLUCOPHAGE) 1000 MG tablet Take 1,000 mg by mouth. Take 1 tablet every morning and 1 and 1/2 tablet every evening.     [provider]  metoprolol (LOPRESSOR) 50 MG tablet Take 50 mg by mouth 2 (two) times daily.      [provider]  nitroGLYCERIN (NITROSTAT) 0.4 MG SL tablet Place 1 tablet (0.4 mg total) under the tongue as needed. 07/19/15   [name], MD  ramipril (ALTACE) 5 MG tablet Take 5 mg by mouth daily.      [provider]  simvastatin (ZOCOR) 40 MG tablet Take 40 mg by mouth as needed.      [provider]    Family History Family History  Problem Relation Age of Onset  . Heart  attack Father 1       MI  . Heart attack Brother 21       MI  (might have been a PE)  . Heart attack Mother 21    Social History Social History  Substance Use Topics  . Smoking status: Never Smoker  . Smokeless tobacco: Never Used  . Alcohol use No     Allergies   Tadalafil   Review of Systems Review of Systems  Constitutional: Negative for chills and fever.  HENT: Positive for trouble swallowing. Negative for ear pain and sore throat.   Eyes: Negative for pain and visual disturbance.  Respiratory: Negative for cough and shortness of breath.   Cardiovascular: Negative for chest pain and palpitations.  Gastrointestinal: Negative for abdominal pain, constipation, diarrhea, nausea and vomiting.  Genitourinary: Negative for dysuria and hematuria.  Musculoskeletal:  Positive for back pain and gait problem. Negative for neck pain.  Skin: Negative for color change and rash.  Neurological: Positive for facial asymmetry, speech difficulty and weakness. Negative for seizures and syncope.       Difficulty walking  Psychiatric/Behavioral: Negative for confusion. The patient is not nervous/anxious.   All other systems reviewed and are negative.    Physical Exam Updated Vital Signs BP (!) 133/99   Pulse 68   Temp 98.9 F (37.2 C) (Oral)   Resp 16   Ht 6' (1.829 m)   Wt 75.8 kg (167 lb)   SpO2 98%   BMI 22.65 kg/m   Physical Exam  Constitutional: He is oriented to person, place, and time. He appears well-developed and well-nourished.  HENT:  Head: Normocephalic and atraumatic.  Eyes: Conjunctivae are normal.  Neck: Normal range of motion. Neck supple.  Cardiovascular: Normal rate and regular rhythm.   No murmur heard. Pulmonary/Chest: Effort normal and breath sounds normal. No respiratory distress.  Abdominal: Soft. There is no tenderness.  Musculoskeletal: He exhibits no edema or tenderness.  Neurological: He is alert and oriented to person, place, and time. A cranial nerve deficit (Patient is unable to track with eyes, ) is present. No sensory deficit. He displays a negative Romberg sign. Coordination and gait abnormal.  Skin: Skin is warm and dry. Capillary refill takes less than 2 seconds. No erythema. No pallor.  Psychiatric: He has a normal mood and affect. His behavior is normal. Judgment normal.  Nursing note and vitals reviewed.    ED Treatments / Results  Labs (all labs ordered are listed, but only abnormal results are displayed) Labs Reviewed  COMPREHENSIVE METABOLIC PANEL - Abnormal; Notable for the following:       Result Value   Sodium 134 (*)    CO2 20 (*)    Glucose, Bld 143 (*)    BUN 21 (*)    ALT 15 (*)    All other components within normal limits  I-STAT CHEM 8, ED - Abnormal; Notable for the following:    BUN 24  (*)    Glucose, Bld 141 (*)    Calcium, Ion 1.13 (*)    Hemoglobin 12.9 (*)    HCT 38.0 (*)    All other components within normal limits  PROTIME-INR  APTT  CBC  DIFFERENTIAL  TROPONIN I  URINALYSIS, ROUTINE W REFLEX MICROSCOPIC  CBG MONITORING, ED    EKG  EKG Interpretation None       Radiology Ct Head Wo Contrast  Result Date: 10/13/2016 CLINICAL DATA:  Ataxia, weak, fall EXAM: CT HEAD WITHOUT CONTRAST TECHNIQUE: Contiguous  axial images were obtained from the base of the skull through the vertex without intravenous contrast. COMPARISON:  MRI 06/03/2006, head CT 06/03/2006 FINDINGS: Brain: No acute territorial infarction, hemorrhage, or intracranial mass is seen. Old appearing lacunar infarcts in the bilateral basal ganglia. Moderate atrophy. Mild small vessel ischemic changes of the deep and periventricular white matter. Prominent ventricles are felt related to atrophy. Vascular: No hyperdense vessels.  Carotid artery calcifications. Skull: No fracture or suspicious bone lesion Sinuses/Orbits: Mucosal thickening in the ethmoid sinuses and frontal sinuses. Opacified left sphenoid sinus with coarse calcification in the central secretions. No acute orbital abnormality. Bilateral lens extraction Other: None IMPRESSION: 1. No definite CT evidence for acute intracranial abnormality. Atrophy and small vessel ischemic changes of the white matter. Old lacunar infarcts in the basal ganglia. MRI follow-up as indicated 2. Sinusitis. Coarse calcification within the opacified sphenoid sinus on the left, findings could be secondary to calcified mucocele or polyp, or chronic bacterial sinusitis. Calcified sinus secretion can also be seen in the setting of fungal infection. Electronically Signed   By: Jasmine Pang M.D.   On: 10/13/2016 19:18    Procedures Procedures (including critical care time)  Medications Ordered in ED Medications - No data to display   Initial Impression / Assessment and  Plan / ED Course  I have reviewed the triage vital signs and the nursing notes.  Pertinent labs & imaging results that were available during my care of the patient were reviewed by me and considered in my medical decision making (see chart for details).     Assessment and plan: Dashawn Bicknell is a 70 year old male who presented with new onset dysarthria, dysphagia, and ataxia for 56 hours. Given his presentation, his risk factors for stroke, his past medical history, physical exam and current head imaging failing to demonstrate acute intracranial bleeding, he is most likely suffering from a cerebrovascular event. Differential included intracranial hemorrhage, intracranial mass, trauma, encephalitis, MI, syncope, and meningitis.   UA, Chem-8, troponin-I, CBC were non-contributory, as was the CT-head and ECG. ECG demonstrated persistent findings with no acute change from prior ECG's indicating normal sinus rhythm with right bundle branch block, LVH, and left anterior fascicular block.   Consulted Neurology for further evaluation, optimization of his medication regimen and treatment if possible. They agreed to see the patient. Consult to Hospitalist placed who returned the call and agreed to admit the patient with Neurology to see.  Patient was seen with and evaluated in conjunction with Dr. Jodi Mourning.   Final Clinical Impressions(s) / ED Diagnoses   Final diagnoses:  Cerebrovascular accident (CVA), unspecified mechanism [facility])    New Prescriptions New Prescriptions   No medications on file     Lanelle Bal, MD 10/14/16 1502    Blane Ohara, MD 10/16/16 1840

## 2016-10-13 NOTE — ED Triage Notes (Signed)
The pt is c/o weakness unsteady gait speech difficulty for 2-3 days and he fell this am  He had a stroke 2004  With lt foot diff and he has a brace on it  Otherwise hes mobile  No pain anywhere

## 2016-10-13 NOTE — H&P (Signed)
History and Physical  Patient Name: Grant Sparks     ZOX:[id number]    DOB: [birth year]    DOA: 10/13/2016 PCP: Kandyce Rud, MD   Patient coming from: Home     Chief Complaint: Dysarthria, gait imbalance  HPI: Grant Sparks is a 71 y.o. male with a past medical history significant for hx CVA w/o residual defs, IDDM, and CAD s/p PCI in 2004 who presents with 2 days dysarthria and now gait imbalance.  The patient was in his usual state of health until the last few days when he has noticed occasional choking on liquids, slurring of his speech, and gait imbalance (he describes this as "I can't get stopped when I get started").  His wife felt this morning he had right facial droop, and then he fell today in the yard, so finally his wife brought him to the ER.  He has also had some generalized fatigue with walking and drooling form both sides of his mouth.  No chest pain, SOB.  No cough, fever, sputum production, urinary discomfort, irritative symptoms.  ED course: -Afebrile, heart rate 74, respirations and pulse ox normal, BP 152/72 -Na 134, K 4.0, Cr 0.89, WBC 6K, Hgb 13 -Coags normal -Troponin normal and ECG unchanged from previous -UA without pyuria or hematuria -CT head with no acute changes -He was discussed with Neurology and TR[facility] were asked to admit for new dysarthria     Review of systems:  Review of Systems  Neurological: Positive for speech change and weakness. Negative for dizziness, tingling, tremors, sensory change, focal weakness, seizures, loss of consciousness and headaches.  All other systems reviewed and are negative.        Past Medical History:  Diagnosis Date  . CAD (coronary artery disease)    S/P stenting of the RCA x2 and PTCA of a PDA lesion in Jan 2004. Pt had a myoracidal infarction when he was in rehabilitation following a CVA.  [name] CVA (cerebral vascular accident) (HCC)    Right  CVA, left hemiparesis  . Diabetes mellitus without complication (HCC)     . MI (myocardial infarction) [facility])     Past Surgical History:  Procedure Laterality Date  . APPENDECTOMY    . CATARACT EXTRACTION    . CERVICAL SPINE SURGERY    . coronary artery stent placement     2004    Social History: Patient lives with his wife.  Patient walks unassisted.  He has a left foot drop, not related to his old stroke.  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  Never smoked.  No alcohol.    Allergies  Allergen Reactions  . Tadalafil Other (See Comments)    Family history: family history includes Diabetes in his brother, father, mother, and sister; Heart attack (age of onset: 55) in his brother; Heart attack (age of onset: 62) in his father; Heart attack (age of onset: 5) in his mother.  Prior to Admission medications   Medication Sig Start Date End Date Taking? Authorizing Provider  aspirin 81 MG tablet Take 81 mg by mouth daily.      [provider]  glipiZIDE (GLUCOTROL XL) 5 MG 24 hr tablet Take 5 mg by mouth daily. 07/04/15   [provider]  insulin detemir (LEVEMIR FLEXPEN) 100 UNIT/ML injection Inject into the skin as directed.      [provider]  metFORMIN (GLUCOPHAGE) 1000 MG tablet Take 1,000 mg by mouth. Take 1 tablet  every morning and 1 and 1/2 tablet every evening.     [provider]  metoprolol (LOPRESSOR) 50 MG tablet Take 50 mg by mouth 2 (two) times daily.      [provider]  nitroGLYCERIN (NITROSTAT) 0.4 MG SL tablet Place 1 tablet (0.4 mg total) under the tongue as needed. 07/19/15   [name], MD  ramipril (ALTACE) 5 MG tablet Take 5 mg by mouth daily.      [provider]  simvastatin (ZOCOR) 40 MG tablet Take 40 mg by mouth as needed.      [provider]     Physical Exam: BP (!) 162/67   Pulse 70   Temp 98.9 F (37.2 C) (Oral)   Resp 20   Ht 6' (1.829 m)   Wt 75.8 kg (167 lb)   SpO2 99%   BMI 22.65 kg/m  General appearance: Well-developed, adult  male, alert and in no acute distress.   Eyes: Anicteric, conjunctiva pink, lids and lashes normal. PERRL.    ENT: No nasal deformity, discharge, epistaxis.  Hearing normal. OP moist without lesions.   Dentition normal, upper dentures. Lymph: No cervical, supraclavicular or axillary lymphadenopathy. Skin: Warm and dry.  No jaundice.  No suspicious rashes or lesions. Cardiac: RRR, nl S1-S2, no murmurs appreciated.  Capillary refill is brisk.  JVP normal.  No LE edema.  Radial and DP pulses 2+ and symmetric.  No carotid bruits. Respiratory: Normal respiratory rate and rhythm.  CTAB without rales or wheezes. GI: Abdomen soft without rigidity.  No TTP. No ascites, distension, no hepatosplenomegaly.   MSK: No deformities or effusions. Neuro: Pupils are 4 mm and reactive to 3 mm. Extraocular movements seem mostly intact, but he will not track.  No nystagmus. Cranial nerve 5 is within normal limits. Cranial nerve 7 shows slight right droop. Cranial nerve 8 is within normal limits. Cranial nerves 9 and 10 reveal equal palate elevation. Cranial nerve 11 reveals sternocleidomastoid strong. Cranial nerve 12 is midline. I do not note a deficit in motor strength testing in the upper and lower extremities bilaterally with normal motor, tone and bulk. Romberg maneuver is negative for pathology. Finger-to-nose testing is subtly asymmetric. Speech is slightly dysarthric. Naming is grossly intact. Attention span and concentration are within normal limits.   Psych: The patient is oriented to time, place and person. Behavior appropriate.  Affect normal.  Recall, recent and remote, as well as general fund of knowledge seem within normal limits. No evidence of aural or visual hallucinations or delusions.       Labs on Admission:  I have personally reviewed following labs and imaging studies: CBC:  Recent Labs Lab 10/13/16 1708 10/13/16 1742  WBC 6.5  --   NEUTROABS 3.4  --   HGB 13.0 12.9*  HCT 39.5  38.0*  MCV 91.2  --   PLT 226  --    Basic Metabolic Panel:  Recent Labs Lab 10/13/16 1708 10/13/16 1742  NA 134* 139  K 4.0 4.0  CL 104 105  CO2 20*  --   GLUCOSE 143* 141*  BUN 21* 24*  CREATININE 0.89 0.90  CALCIUM 9.1  --    GFR: Estimated Creatinine Clearance: 80.7 mL/min (by C-G formula based on SCr of 0.9 mg/dL). Liver Function Tests:  Recent Labs Lab 10/13/16 1708  AST 20  ALT 15*  ALKPHOS 50  BILITOT 0.5  PROT 6.7  ALBUMIN 3.9   No results for input(s): LIPASE, AMYLASE in the  last 168 hours. No results for input(s): AMMONIA in the last 168 hours. Coagulation Profile:  Recent Labs Lab 10/13/16 1708  INR 0.92   Cardiac Enzymes:  Recent Labs Lab 10/13/16 1708  TROPONINI <0.03   BNP (last 3 results) No results for input(s): PROBNP in the last 8760 hours. HbA1C: No results for input(s): HGBA1C in the last 72 hours. CBG: No results for input(s): GLUCAP in the last 168 hours. Lipid Profile: No results for input(s): CHOL, HDL, LDLCALC, TRIG, CHOLHDL, LDLDIRECT in the last 72 hours. Thyroid Function Tests: No results for input(s): TSH, T4TOTAL, FREET4, T3FREE, THYROIDAB in the last 72 hours. Anemia Panel: No results for input(s): VITAMINB12, FOLATE, FERRITIN, TIBC, IRON, RETICCTPCT in the last 72 hours. Sepsis Labs: Invalid input(s): PROCALCITONIN, LACTICIDVEN No results found for this or any previous visit (from the past 240 hour(s)).    Radiological Exams on Admission: Personally reviewed CT head report: Ct Head Wo Contrast  Result Date: 10/13/2016 CLINICAL DATA:  Ataxia, weak, fall EXAM: CT HEAD WITHOUT CONTRAST TECHNIQUE: Contiguous axial images were obtained from the base of the skull through the vertex without intravenous contrast. COMPARISON:  MRI 06/03/2006, head CT 06/03/2006 FINDINGS: Brain: No acute territorial infarction, hemorrhage, or intracranial mass is seen. Old appearing lacunar infarcts in the bilateral basal ganglia. Moderate  atrophy. Mild small vessel ischemic changes of the deep and periventricular white matter. Prominent ventricles are felt related to atrophy. Vascular: No hyperdense vessels.  Carotid artery calcifications. Skull: No fracture or suspicious bone lesion Sinuses/Orbits: Mucosal thickening in the ethmoid sinuses and frontal sinuses. Opacified left sphenoid sinus with coarse calcification in the central secretions. No acute orbital abnormality. Bilateral lens extraction Other: None IMPRESSION: 1. No definite CT evidence for acute intracranial abnormality. Atrophy and small vessel ischemic changes of the white matter. Old lacunar infarcts in the basal ganglia. MRI follow-up as indicated 2. Sinusitis. Coarse calcification within the opacified sphenoid sinus on the left, findings could be secondary to calcified mucocele or polyp, or chronic bacterial sinusitis. Calcified sinus secretion can also be seen in the setting of fungal infection. Electronically Signed   By: Jasmine Pang M.D.   On: 10/13/2016 19:18     EKG: Independently reviewed. Rate 74, QTc 452, RBBB and LAFB old.      Assessment/Plan  1. Suspected Stroke:  New dysarthria, difficulty swallowing liquids, ataxia.  MRI pending. -Admit to telemetry -Neuro checks, NIHSS per protocol -Daily aspirin increase to 325 mg -Continue statin -Permissive hypertension for now -Lipids, hemoglobin A1c -Carotid doppler ordered -Echocardiogram ordered -PT/OT/SLP consultation -Consult to Neurology, appreciate recommendations   2. Diabetes:  -Daily Levemir, low dose -SSI efery 4 hrs while NPO -Hold glipizide  3. HTN:  -Permissive hypertension for now, hold ramipril  4. Coronary disease: -Continue BB -Continue statin         DVT prophylaxis: Lovenox  Code Status: FUL  Family Communication: Wife  Disposition Plan: Anticipate Stroke work up as above and consult to ancillary services.  Expect discharge within 3 days. Consults called:  Neurology, Dr. Otelia Limes will see patinet Admission status: Telemetry, OBS status  Core measures: -VTE prophylaxis ordered at time of admission -Aspirin ordered at admission -Atrial fibrillation: not presetn -tPA not given because of outside window -Dysphagia screen ordered in ER -Lipids ordered -PT eval ordered -Nonsmoker    Medical decision making: Patient seen at 11:20 PM on 10/13/2016.  The patient was discussed with Dr. Colon Flattery. What exists of the patient's chart was reviewed in depth and summarized  above.  Clinical condition: stable.       Alberteen Sam [facility] Pager [phone]

## 2016-10-14 ENCOUNTER — Observation Stay (HOSPITAL_COMMUNITY): Payer: Medicare Other

## 2016-10-14 ENCOUNTER — Encounter (HOSPITAL_COMMUNITY): Payer: Medicare Other

## 2016-10-14 ENCOUNTER — Encounter (HOSPITAL_COMMUNITY): Payer: Self-pay

## 2016-10-14 DIAGNOSIS — R29810 Facial weakness: Secondary | ICD-10-CM | POA: Diagnosis not present

## 2016-10-14 DIAGNOSIS — I6339 Cerebral infarction due to thrombosis of other cerebral artery: Secondary | ICD-10-CM

## 2016-10-14 DIAGNOSIS — I639 Cerebral infarction, unspecified: Secondary | ICD-10-CM | POA: Diagnosis not present

## 2016-10-14 DIAGNOSIS — I6350 Cerebral infarction due to unspecified occlusion or stenosis of unspecified cerebral artery: Secondary | ICD-10-CM

## 2016-10-14 DIAGNOSIS — D649 Anemia, unspecified: Secondary | ICD-10-CM

## 2016-10-14 DIAGNOSIS — I10 Essential (primary) hypertension: Secondary | ICD-10-CM | POA: Diagnosis not present

## 2016-10-14 DIAGNOSIS — I6330 Cerebral infarction due to thrombosis of unspecified cerebral artery: Secondary | ICD-10-CM

## 2016-10-14 DIAGNOSIS — Z794 Long term (current) use of insulin: Secondary | ICD-10-CM | POA: Diagnosis not present

## 2016-10-14 LAB — CBC WITH DIFFERENTIAL/PLATELET
BASOS PCT: 0 %
Basophils Absolute: 0 10*3/uL (ref 0.0–0.1)
EOS PCT: 2 %
Eosinophils Absolute: 0.1 10*3/uL (ref 0.0–0.7)
HEMATOCRIT: 37.1 % — AB (ref 39.0–52.0)
Hemoglobin: 12.3 g/dL — ABNORMAL LOW (ref 13.0–17.0)
Lymphocytes Relative: 40 %
Lymphs Abs: 2.5 10*3/uL (ref 0.7–4.0)
MCH: 29.9 pg (ref 26.0–34.0)
MCHC: 33.2 g/dL (ref 30.0–36.0)
MCV: 90 fL (ref 78.0–100.0)
MONO ABS: 0.6 10*3/uL (ref 0.1–1.0)
MONOS PCT: 10 %
NEUTROS ABS: 3 10*3/uL (ref 1.7–7.7)
Neutrophils Relative %: 48 %
PLATELETS: 213 10*3/uL (ref 150–400)
RBC: 4.12 MIL/uL — ABNORMAL LOW (ref 4.22–5.81)
RDW: 13.6 % (ref 11.5–15.5)
WBC: 6.2 10*3/uL (ref 4.0–10.5)

## 2016-10-14 LAB — GLUCOSE, CAPILLARY
GLUCOSE-CAPILLARY: 93 mg/dL (ref 65–99)
GLUCOSE-CAPILLARY: 323 mg/dL — AB (ref 65–99)
GLUCOSE-CAPILLARY: 126 mg/dL — AB (ref 65–99)
Glucose-Capillary: 112 mg/dL — ABNORMAL HIGH (ref 65–99)
Glucose-Capillary: 142 mg/dL — ABNORMAL HIGH (ref 65–99)

## 2016-10-14 LAB — COMPREHENSIVE METABOLIC PANEL
ALBUMIN: 3.6 g/dL (ref 3.5–5.0)
ALK PHOS: 45 U/L (ref 38–126)
ALT: 15 U/L — ABNORMAL LOW (ref 17–63)
ANION GAP: 5 (ref 5–15)
AST: 17 U/L (ref 15–41)
BUN: 13 mg/dL (ref 6–20)
CALCIUM: 8.5 mg/dL — AB (ref 8.9–10.3)
CHLORIDE: 106 mmol/L (ref 101–111)
CO2: 24 mmol/L (ref 22–32)
CREATININE: 0.7 mg/dL (ref 0.61–1.24)
GFR calc Af Amer: >60 mL/min (ref >60)
GFR calc non Af Amer: >60 mL/min (ref >60)
GLUCOSE: 103 mg/dL — AB (ref 65–99)
Potassium: 3.5 mmol/L (ref 3.5–5.1)
SODIUM: 135 mmol/L (ref 135–145)
Total Bilirubin: 0.7 mg/dL (ref 0.3–1.2)
Total Protein: 5.9 g/dL — ABNORMAL LOW (ref 6.5–8.1)

## 2016-10-14 LAB — HEMOGLOBIN A1C
HEMOGLOBIN A1C: 7.4 % — AB (ref 4.8–5.6)
MEAN PLASMA GLUCOSE: 165.68 mg/dL

## 2016-10-14 LAB — LIPID PANEL
CHOL/HDL RATIO: 3.6 ratio
CHOLESTEROL: 127 mg/dL (ref 0–200)
HDL: 35 mg/dL — ABNORMAL LOW (ref >40)
LDL Cholesterol: 78 mg/dL (ref 0–99)
Triglycerides: 68 mg/dL (ref <150)
VLDL: 14 mg/dL (ref 0–40)

## 2016-10-14 LAB — PHOSPHORUS: PHOSPHORUS: 3.4 mg/dL (ref 2.5–4.6)

## 2016-10-14 LAB — MAGNESIUM: Magnesium: 1.9 mg/dL (ref 1.7–2.4)

## 2016-10-14 MED ORDER — INSULIN DETEMIR 100 UNIT/ML ~~LOC~~ SOLN
10.0000 [IU] | Freq: Every day | SUBCUTANEOUS | Status: DC
  Administered 2016-10-14 – 2016-10-15 (×2): 10 [IU] via SUBCUTANEOUS
  Filled 2016-10-14 (×2): qty 0.1

## 2016-10-14 MED ORDER — RESOURCE THICKENUP CLEAR PO POWD
Freq: Three times a day (TID) | ORAL | Status: DC
  Administered 2016-10-14 – 2016-10-15 (×2): via ORAL
  Filled 2016-10-14 (×2): qty 125

## 2016-10-14 MED ORDER — METOPROLOL TARTRATE 50 MG PO TABS
50.0000 mg | ORAL_TABLET | Freq: Two times a day (BID) | ORAL | Status: DC
  Administered 2016-10-14 – 2016-10-15 (×3): 50 mg via ORAL
  Filled 2016-10-14 (×3): qty 1

## 2016-10-14 MED ORDER — ASPIRIN 325 MG PO TABS
325.0000 mg | ORAL_TABLET | Freq: Every day | ORAL | Status: DC
  Administered 2016-10-14 – 2016-10-15 (×2): 325 mg via ORAL
  Filled 2016-10-14 (×2): qty 1

## 2016-10-14 MED ORDER — FOOD THICKENER (SIMPLYTHICK)
1.0000 | Freq: Three times a day (TID) | ORAL | Status: DC
  Filled 2016-10-14: qty 1

## 2016-10-14 MED ORDER — IOPAMIDOL (ISOVUE-370) INJECTION 76%
INTRAVENOUS | Status: AC
  Administered 2016-10-14: 50 mL
  Filled 2016-10-14: qty 50

## 2016-10-14 MED ORDER — ACETAMINOPHEN 325 MG PO TABS: 650.0000 mg | ORAL_TABLET | ORAL | Status: DC | PRN

## 2016-10-14 MED ORDER — ACETAMINOPHEN 650 MG RE SUPP: 650.0000 mg | RECTAL | Status: DC | PRN

## 2016-10-14 MED ORDER — ASPIRIN 300 MG RE SUPP
300.0000 mg | Freq: Every day | RECTAL | Status: DC
  Filled 2016-10-14: qty 1

## 2016-10-14 MED ORDER — ENOXAPARIN SODIUM 40 MG/0.4ML ~~LOC~~ SOLN
40.0000 mg | SUBCUTANEOUS | Status: DC
  Administered 2016-10-14 – 2016-10-15 (×2): 40 mg via SUBCUTANEOUS
  Filled 2016-10-14 (×2): qty 0.4

## 2016-10-14 MED ORDER — FOOD THICKENER (SIMPLYTHICK): 1.0000 | Freq: Three times a day (TID) | ORAL | Status: DC

## 2016-10-14 MED ORDER — INSULIN ASPART 100 UNIT/ML ~~LOC~~ SOLN: 0.0000 [IU] | SUBCUTANEOUS | Status: DC

## 2016-10-14 MED ORDER — STROKE: EARLY STAGES OF RECOVERY BOOK
Freq: Once | Status: AC
  Administered 2016-10-14: 03:00:00

## 2016-10-14 MED ORDER — ACETAMINOPHEN 160 MG/5ML PO SOLN: 650.0000 mg | ORAL | Status: DC | PRN

## 2016-10-14 MED ORDER — INSULIN ASPART 100 UNIT/ML ~~LOC~~ SOLN
0.0000 [IU] | Freq: Three times a day (TID) | SUBCUTANEOUS | Status: DC
  Administered 2016-10-14: 2 [IU] via SUBCUTANEOUS
  Administered 2016-10-15 (×2): 3 [IU] via SUBCUTANEOUS
  Administered 2016-10-15: 5 [IU] via SUBCUTANEOUS

## 2016-10-14 MED ORDER — SIMVASTATIN 40 MG PO TABS
40.0000 mg | ORAL_TABLET | Freq: Every day | ORAL | Status: DC
  Administered 2016-10-14 – 2016-10-15 (×2): 40 mg via ORAL
  Filled 2016-10-14 (×2): qty 1

## 2016-10-14 NOTE — Consult Note (Signed)
NEURO HOSPITALIST CONSULT NOTE   Requestig physician: Dr. Marland Mcalpine  Reason for Consult: Progressive dysphasia and difficulty ambulating since Thursday  History obtained from:  Patient     HPI:                                                                                                                                          Grant Sparks is an 71 y.o. male who presented to the ED with 2-3 day history of unsteady gait ("I can't get stopped when I started") and dysarthria. He fell in his yard on Saturday morning after an episode of gait festination during which he could not stop walking for 30 feet until he was stopped by a tree. He also had dysphagia with occasional choking on liquids. His wife noticed a right facial droop as well as drooling from both sides of his mouth. He has a history of stroke in 2004 manifesting with left hemiparesis initially followed by resolution of deficits except for requirement of a left foot brace. In the ED he had a noticeable right facial droop. He had equal grips with no leg or arm drift. He has no diagnosis of Parkinsonism, but his older brother was recently diagnosed with Parkinson's disease. He has had problems with drooling for the past 2-3 weeks, manifesting when his head is bent forward. He denies a history of tremor but has had worsening gait unsteadiness with small steps over the past year.   PMHx also includes DM,CAD s/p stenting and MI.   Past Medical History:  Diagnosis Date  . CAD (coronary artery disease)    S/P stenting of the RCA x2 and PTCA of a PDA lesion in Jan 2004. Pt had a myoracidal infarction when he was in rehabilitation following a CVA.  [name] CVA (cerebral vascular accident) (HCC)    Right  CVA, left hemiparesis  . Diabetes mellitus without complication (HCC)   . MI (myocardial infarction) [facility])     Past Surgical History:  Procedure Laterality Date  . APPENDECTOMY    . CATARACT EXTRACTION    . CERVICAL SPINE  SURGERY    . coronary artery stent placement     2004    Family History  Problem Relation Age of Onset  . Heart attack Father 62       MI  . Diabetes Father   . Heart attack Brother 21       MI  (might have been a PE)  . Diabetes Brother   . Heart attack Mother 47  . Diabetes Mother   . Diabetes Sister     Social History:  reports that he has never smoked. He has never used smokeless tobacco. He reports that he does not drink alcohol. His drug history is not on  file.  Allergies  Allergen Reactions  . Tadalafil Other (See Comments)    MEDICATIONS:                                                                                                                     Prior to Admission:  Prescriptions Prior to Admission  Medication Sig Dispense Refill Last Dose  . aspirin 81 MG tablet Take 81 mg by mouth daily.     Taking  . glipiZIDE (GLUCOTROL XL) 5 MG 24 hr tablet Take 5 mg by mouth daily.   Taking  . insulin detemir (LEVEMIR FLEXPEN) 100 UNIT/ML injection Inject into the skin as directed.     Taking  . metFORMIN (GLUCOPHAGE) 1000 MG tablet Take 1,000 mg by mouth. Take 1 tablet every morning and 1 and 1/2 tablet every evening.    Taking  . metoprolol (LOPRESSOR) 50 MG tablet Take 50 mg by mouth 2 (two) times daily.     Taking  . nitroGLYCERIN (NITROSTAT) 0.4 MG SL tablet Place 1 tablet (0.4 mg total) under the tongue as needed. 25 tablet 3   . ramipril (ALTACE) 5 MG tablet Take 5 mg by mouth daily.     Taking  . simvastatin (ZOCOR) 40 MG tablet Take 40 mg by mouth as needed.     Taking   Scheduled: . aspirin  300 mg Rectal Daily   Or  . aspirin  325 mg Oral Daily  . enoxaparin (LOVENOX) injection  40 mg Subcutaneous Q24H  . insulin aspart  0-15 Units Subcutaneous Q4H  . insulin detemir  10 Units Subcutaneous Daily  . metoprolol tartrate  50 mg Oral BID  . simvastatin  40 mg Oral q1800   ROS:                                                                                                                                        No cough, fever, CP, SOB, headache, dizziness or paresthesia.   Blood pressure 139/68, pulse 68, temperature 98.4 F (36.9 C), temperature source Oral, resp. rate 20, height 6' (1.829 m), weight 75.8 kg (167 lb 1.6 oz), SpO2 97 %.  General Examination:  HEENT-  [place]/AT  Lungs- Respirations unlabored Extremities- No edema. LLE calf muscles smaller than on right due to prior stroke.   Neurological Examination Mental Status: Alert, oriented, thought content appropriate. Speech fluent without evidence of aphasia.  Able to follow all commands without difficulty. Cranial Nerves: II: Visual fields intact. PERRL  III,IV, VI: ptosis not present, EOMI without nystagmus.  V,VII: Hypomimia noted. Grimace is symmetric. Facial temp sensation normal bilaterally VIII: hearing intact to conversation IX,X: Hypophonia noted.  XI: bilateral shoulder shrug is slightly weaker on left XII: midline tongue extension Motor: RUE: 5/5 proximal and distal RLE: 5/5 proximal and distal LUE: 4+/5 proximally LLE: 4/5 proximal and distal Mildly increased tone x 4 c/w rigidity, slightly worse on the right.  Sensory: Temp and light touch intact throughout, bilaterally. No extinction. Deep Tendon Reflexes: Mildly increased reflexes on left relative to right in the context of globally decreased amplitude seen in conjunction with mild rigidity.  Plantars: Equivocal bilaterally Cerebellar: Mild dysmetria with FNF on the right. Subtly bradykinetic bilaterally.  Gait: Deferred  Lab Results: Basic Metabolic Panel:  Recent Labs Lab 10/13/16 1708 10/13/16 1742  NA 134* 139  K 4.0 4.0  CL 104 105  CO2 20*  --   GLUCOSE 143* 141*  BUN 21* 24*  CREATININE 0.89 0.90  CALCIUM 9.1  --     Liver Function Tests:  Recent Labs Lab 10/13/16 1708  AST 20  ALT 15*   ALKPHOS 50  BILITOT 0.5  PROT 6.7  ALBUMIN 3.9   No results for input(s): LIPASE, AMYLASE in the last 168 hours. No results for input(s): AMMONIA in the last 168 hours.  CBC:  Recent Labs Lab 10/13/16 1708 10/13/16 1742  WBC 6.5  --   NEUTROABS 3.4  --   HGB 13.0 12.9*  HCT 39.5 38.0*  MCV 91.2  --   PLT 226  --     Cardiac Enzymes:  Recent Labs Lab 10/13/16 1708  TROPONINI <0.03    Lipid Panel:  Recent Labs Lab 10/14/16 0341  CHOL 127  TRIG 68  HDL 35*  CHOLHDL 3.6  VLDL 14  LDLCALC 78    CBG:  Recent Labs Lab 10/14/16 0216 10/14/16 0607  GLUCAP 93 112*    Microbiology: No results found for this or any previous visit.  Coagulation Studies:  Recent Labs  10/13/16 1708  LABPROT 12.4  INR 0.92    Imaging: Ct Head Wo Contrast  Result Date: 10/13/2016 CLINICAL DATA:  Ataxia, weak, fall EXAM: CT HEAD WITHOUT CONTRAST TECHNIQUE: Contiguous axial images were obtained from the base of the skull through the vertex without intravenous contrast. COMPARISON:  MRI 06/03/2006, head CT 06/03/2006 FINDINGS: Brain: No acute territorial infarction, hemorrhage, or intracranial mass is seen. Old appearing lacunar infarcts in the bilateral basal ganglia. Moderate atrophy. Mild small vessel ischemic changes of the deep and periventricular white matter. Prominent ventricles are felt related to atrophy. Vascular: No hyperdense vessels.  Carotid artery calcifications. Skull: No fracture or suspicious bone lesion Sinuses/Orbits: Mucosal thickening in the ethmoid sinuses and frontal sinuses. Opacified left sphenoid sinus with coarse calcification in the central secretions. No acute orbital abnormality. Bilateral lens extraction Other: None IMPRESSION: 1. No definite CT evidence for acute intracranial abnormality. Atrophy and small vessel ischemic changes of the white matter. Old lacunar infarcts in the basal ganglia. MRI follow-up as indicated 2. Sinusitis. Coarse  calcification within the opacified sphenoid sinus on the left, findings could be secondary to calcified mucocele or  polyp, or chronic bacterial sinusitis. Calcified sinus secretion can also be seen in the setting of fungal infection. Electronically Signed   By: Jasmine Pang M.D.   On: 10/13/2016 19:18   Mr Brain Wo Contrast  Result Date: 10/14/2016 CLINICAL DATA:  Two days of dysarthria and gait imbalance. History of stroke, diabetes, hypertension. EXAM: MRI HEAD WITHOUT CONTRAST TECHNIQUE: Multiplanar, multiecho pulse sequences of the brain and surrounding structures were obtained without intravenous contrast. COMPARISON:  CT HEAD October 13, 2016 and MRI of the head June 03, 2006 FINDINGS: BRAIN: 5 mm focus reduced diffusion LEFT pons with low ADC values. Faint susceptibility artifact LEFT basal ganglia associated with old infarcts. No susceptibility artifact to suggest acute blood products. Ex vacuo dilatation LEFT lateral ventricle, no hydrocephalus. Parenchymal brain volume loss, advanced for age. Old pontine lacunar infarcts and advanced atrophy. Patchy supratentorial white matter FLAIR T2 hyperintensities. No midline shift, mass effect or masses. No abnormal extra-axial fluid collections. VASCULAR: Normal major intracranial vascular flow voids present at skull base. SKULL AND UPPER CERVICAL SPINE: No abnormal sellar expansion. No suspicious calvarial bone marrow signal. Craniocervical junction maintained. SINUSES/ORBITS: Chronic LEFT sphenoid sinusitis with low signal inspissated mucus. Trace mastoid effusions versus bone marrow signal. The included ocular globes and orbital contents are non-suspicious. Status post bilateral ocular lens implants. OTHER: Patient is edentulous. IMPRESSION: 1. Acute pontine lacunar infarct. 2. Numerous old pontine infarcts with advanced pontine atrophy. Old LEFT basal ganglia infarct. 3. Mild to moderate chronic small vessel ischemic disease. 4. Moderate parenchymal brain  volume loss, advanced for age. Electronically Signed   By: Awilda Metro M.D.   On: 10/14/2016 02:01    Assessment:  71 year old male with acute pontine lacunar infarct manifesting as gait unsteadiness, drooling, dysarthria. Also with new onset of festinating gait on a background history of 1 year of progressive gait instability 1. MRI brain reveals an acute pontine lacunar infarct. Numerous old pontine infarcts with advanced pontine atrophy are also noted. Old LEFT basal ganglia infarct is seen. There is mild to moderate chronic small vessel ischemic disease and moderate parenchymal brain volume loss, advanced for age. 2. History of PCI with stenting. Will require ASA indefinitely.  Recommendations: 1. Continue ASA and Zocor.  2. Add Plavix to current regimen.  3. MRA of head 4. Carotid ultrasound 5. TTE 6. Cardiac telemetry 7. Given that the acute infarction is a small vessel stroke, there will likely be no benefit of permissive HTN. Would manage BP as per standard protocols.  8. PT/OT/Speech 9. Will need an outpatient movement disorders consultation. If there are no appointments available in [place], he can be [facility], which has an excellent movement disorders program.   Electronically signed: Dr. Caryl Pina 10/14/2016, 6:11 AM

## 2016-10-14 NOTE — ED Notes (Signed)
Pt taken to MRI; will be transported to floor when pt returns

## 2016-10-14 NOTE — Progress Notes (Signed)
Spoke to family about stroke and need for workup this afternoon.  Also most likely has parkinson's and discussed he could  benefit from trial of Sinemet. Family said they will consider it. Stroke team can discuss about it tomorrow.

## 2016-10-14 NOTE — Progress Notes (Signed)
OT Cancellation Note  Patient Details Name: Grant Sparks MRN: [id number] DOB: [birth year]   Cancelled Treatment:    Reason Eval/Treat Not Completed: Patient at procedure or test/ unavailable. Pt off unit at testing on OT arrival. Will check back as able to initiate evaluation.   Doristine Section, MS OTR/L  Pager: [phone]   Grant Sparks 10/14/2016, 11:06 AM

## 2016-10-14 NOTE — Evaluation (Signed)
Occupational Therapy Evaluation Patient Details Name: Grant Sparks MRN: [id number] DOB: 1945/09/10 Today's Date: 10/14/2016    History of Present Illness Patient is a 71 y/o male who presents with gait imbalance and dysarthria. MRI- acute pontine lacunar infarct. PMH includes CVA, MI, CAD, DM.   Clinical Impression   PTA, pt reports independence with single point cane for ADL and functional mobility. He currently requires min guard assist for all standing ADL and toilet transfers due to significant instability. Noted shuffling steps and incoordinated movement during functional mobility. Pt additionally demonstrates decreased activity tolerance for ADL and generalized B UE weakness impacting his ability to participate in ADL safely. Pt would benefit from continued OT services while admitted to improve independence and safety prior to return home with home health OT follow-up.     Follow Up Recommendations  Home health OT;Supervision/Assistance - 24 hour    Equipment Recommendations  None recommended by OT (Has needs met)    Recommendations for Other Services       Precautions / Restrictions Precautions Precautions: Fall Required Braces or Orthoses: Other Brace/Splint Other Brace/Splint: left AFO Restrictions Weight Bearing Restrictions: No      Mobility Bed Mobility Overal bed mobility: Needs Assistance Bed Mobility: Supine to Sit;Sit to Supine     Supine to sit: Supervision;HOB elevated Sit to supine: Supervision;HOB elevated   General bed mobility comments: Use of rail to get to EOB, no assist needed.   Transfers Overall transfer level: Needs assistance Equipment used:  (cane) Transfers: Sit to/from Stand Sit to Stand: Min guard         General transfer comment: Min guard to steady in standing.     Balance Overall balance assessment: Needs assistance;History of Falls Sitting-balance support: Feet supported;No upper extremity supported Sitting balance-Leahy  Scale: Good     Standing balance support: During functional activity Standing balance-Leahy Scale: Fair Standing balance comment: Pt requiring external support for dynamic activity.                            ADL either performed or assessed with clinical judgement   ADL Overall ADL's : Needs assistance/impaired Eating/Feeding: Set up;Sitting   Grooming: Min guard;Standing   Upper Body Bathing: Set up;Sitting   Lower Body Bathing: Min guard;Sit to/from stand   Upper Body Dressing : Set up;Sitting   Lower Body Dressing: Min guard;Sit to/from stand Lower Body Dressing Details (indicate cue type and reason): Able to thread feet without assistance but will require steadying assist on standing to pull up pants, etc.  Toilet Transfer: Min guard;Ambulation [facility])   Toileting- Clothing Manipulation and Hygiene: Min guard;Sit to/from stand       Functional mobility during ADLs: Min guard;Cane General ADL Comments: Pt significantly unsteady during ambulation for simulated toilet transfers.      Vision Baseline Vision/History: Wears glasses Wears Glasses: At all times Patient Visual Report: No change from baseline Additional Comments: Able to functionally utilize vision without difficulty.      Perception     Praxis      Pertinent Vitals/Pain Pain Assessment: No/denies pain     Hand Dominance Right   Extremity/Trunk Assessment Upper Extremity Assessment Upper Extremity Assessment: Generalized weakness   Lower Extremity Assessment Lower Extremity Assessment: Defer to PT evaluation       Communication Communication Communication: Expressive difficulties   Cognition Arousal/Alertness: Awake/alert Behavior During Therapy: WFL for tasks assessed/performed Overall Cognitive Status: History of cognitive impairments -  at baseline                                 General Comments: Accurately able to respond but requires increased time.    General  Comments  Wife and daughter present during session.     Exercises     Shoulder Instructions      Home Living Family/patient expects to be discharged to:: Private residence Living Arrangements: Spouse/significant other Available Help at Discharge: Family;Available 24 hours/day Type of Home: House Home Access: Stairs to enter [employer] of Steps: 3-4 Entrance Stairs-Rails: Right Home Layout: Two level;Bed/bath upstairs Alternate Level Stairs-Number of Steps: 1 flight Alternate Level Stairs-Rails: Right Bathroom Shower/Tub: Occupational psychologist: Standard     Home Equipment: Shower seat - built in;Walker - 2 wheels;Kasandra Knudsen - single point      Lives With: Spouse    Prior Functioning/Environment Level of Independence: Independent with assistive device(s)        Comments: uses SPC as needed. Does not drive.        OT Problem List: Decreased strength;Decreased activity tolerance;Impaired balance (sitting and/or standing);Decreased safety awareness;Decreased cognition      OT Treatment/Interventions: Self-care/ADL training;Therapeutic exercise;Energy conservation;DME and/or AE instruction;Therapeutic activities;Patient/family education;Balance training    OT Goals(Current goals can be found in the care plan section) Acute Rehab OT Goals Patient Stated Goal: to go home OT Goal Formulation: With patient Time For Goal Achievement: 10/28/16 Potential to Achieve Goals: Good ADL Goals Pt Will Perform Grooming: with modified independence;standing Pt Will Perform Lower Body Dressing: with modified independence;sit to/from stand Pt Will Transfer to Toilet: with modified independence;regular height toilet;ambulating (BSC over toilet) Pt Will Perform Toileting - Clothing Manipulation and hygiene: with modified independence;sit to/from stand Pt Will Perform Tub/Shower Transfer: Shower transfer;with supervision;with caregiver independent in  assisting;ambulating;shower seat  OT Frequency: Min 2X/week   Barriers to D/C:            Co-evaluation              AM-PAC PT "6 Clicks" Daily Activity     Outcome Measure Help from another person eating meals?: None Help from another person taking care of personal grooming?: A Little Help from another person toileting, which includes using toliet, bedpan, or urinal?: A Little Help from another person bathing (including washing, rinsing, drying)?: A Little Help from another person to put on and taking off regular upper body clothing?: None Help from another person to put on and taking off regular lower body clothing?: A Little 6 Click Score: 20   End of Session Equipment Utilized During Treatment:  (cane)  Activity Tolerance: Patient tolerated treatment well Patient left: in bed;with call bell/phone within reach;with family/visitor present  OT Visit Diagnosis: Unsteadiness on feet (R26.81);Hemiplegia and hemiparesis Hemiplegia - Right/Left: Left Hemiplegia - caused by: Cerebral infarction (previous CVA in 2004)                Time: 1062-6948 OT Time Calculation (min): 17 min Charges:  OT General Charges $OT Visit: 1 Procedure OT Evaluation $OT Eval Moderate Complexity: 1 Procedure G-Codes: OT G-codes **NOT FOR INPATIENT CLASS** Functional Assessment Tool Used: Clinical judgement Functional Limitation: Self care Self Care Current Status (N4627): At least 1 percent but less than 20 percent impaired, limited or restricted Self Care Goal Status (O3500): 0 percent impaired, limited or restricted   Norman Herrlich, MS OTR/L  Pager: [phone]  Jillane Po A Emillee Talsma 10/14/2016, 1:18 PM

## 2016-10-14 NOTE — Evaluation (Signed)
Speech Language Pathology Evaluation Patient Details Name: Grant Sparks MRN: [id number] DOB: 20-Mar-1945 Today's Date: 10/14/2016 Time: 1000-1030 SLP Time Calculation (min) (ACUTE ONLY): 30 min  Problem List:  Patient Active Problem List   Diagnosis Date Noted  . Cerebral thrombosis with cerebral infarction 10/14/2016  . Facial droop 10/13/2016  . Benign essential hypertension 10/13/2016  . Controlled type 2 diabetes mellitus with stage 3 chronic kidney disease, with long-term current use of insulin (HCC) 10/13/2016  . CVA (cerebral vascular accident) (HCC)   . CAD 03/01/2008  . CORONARY ARTERY DISEASE, S/P PTCA 03/01/2008  . CVA 03/01/2008   Past Medical History:  Past Medical History:  Diagnosis Date  . CAD (coronary artery disease)    S/P stenting of the RCA x2 and PTCA of a PDA lesion in Jan 2004. Pt had a myoracidal infarction when he was in rehabilitation following a CVA.  [name] CVA (cerebral vascular accident) (HCC)    Right  CVA, left hemiparesis  . Diabetes mellitus without complication (HCC)   . MI (myocardial infarction) [facility])    Past Surgical History:  Past Surgical History:  Procedure Laterality Date  . APPENDECTOMY    . CATARACT EXTRACTION    . CERVICAL SPINE SURGERY    . coronary artery stent placement     20039   HPI:  71 year old male admitted 10/13/16 with dysarthria and gait disturbance. PMH significant for RCVA (2004), IDDM, CAD, MI. MRI = acute pontine lacunar infarct, numerous old pontinue infarcts with atrophy, old Left basal ganglia infarct. Pt reports drooling and difficulty with thin liquids via straw recently.   Assessment / Plan / Recommendation Clinical Impression  The Montreal Cognitive Assessment (MoCA) was administered. Pt scored 25/30 (n=26+/30) indicating mild high level cognitive impairment, which family reports to be pt baseline after CVA in 2004. Points were lost on this assessment on the following subtests: immediate and delayed recall and  thought organization. Pt/family were encouraged to notify MD if changes are noted in cognitive functioning after return to normal routine, as home health or outpatient therapy may be beneficial. No further ST intervention for com/cog status is recommended at this time. RN and MD aware,     SLP Assessment  SLP Recommendation/Assessment: Patient does not need any further Speech Lanaguage Pathology Services SLP Visit Diagnosis: Cognitive communication deficit (R41.841) Attention and concentration deficit following: Cerebral infarction       SLP Evaluation Cognition  Overall Cognitive Status: History of cognitive impairments - at baseline Arousal/Alertness: Awake/alert Orientation Level: Oriented X4 Attention: Focused;Sustained Focused Attention: Appears intact Sustained Attention: Appears intact Memory: Impaired Memory Impairment: Retrieval deficit;Decreased recall of new information Awareness: Appears intact Problem Solving: Impaired Executive Function: Organizing Organizing: Appears intact       Comprehension  Auditory Comprehension Overall Auditory Comprehension: Appears within functional limits for tasks assessed    Expression Expression Primary Mode of Expression: Verbal Verbal Expression Overall Verbal Expression: Appears within functional limits for tasks assessed Written Expression Dominant Hand: Right   Oral / Motor  Oral Motor/Sensory Function Overall Oral Motor/Sensory Function: Mild impairment Facial ROM: Reduced right Facial Symmetry: Abnormal symmetry right Facial Strength: Within Functional Limits Facial Sensation: Within Functional Limits Lingual ROM: Within Functional Limits Lingual Symmetry: Within Functional Limits Lingual Strength: Within Functional Limits Lingual Sensation: Within Functional Limits Velum: Within Functional Limits Mandible: Within Functional Limits Motor Speech Overall Motor Speech: Appears within functional limits for tasks assessed    GO  Celia B. [place], [facility], CCC-SLP [phone]  Leigh Aurora 10/14/2016, 10:47 AM

## 2016-10-14 NOTE — Progress Notes (Signed)
PROGRESS NOTE    Grant Sparks  RUE:[id number] DOB: [birth year] DOA: 10/13/2016 PCP: Kandyce Rud, MD   Brief Narrative:  Grant Sparks is a 71 y.o. male with a past medical history significant for hx CVA w/o residual defs, IDDM, and CAD s/p PCI in 2004 who presents with 2 days dysarthria and now gait imbalance.The patient was in his usual state of health until the last few days when he has noticed occasional choking on liquids, slurring of his speech, and gait imbalance (he describes this as "I can't get stopped when I get started").  His wife felt this morning he had right facial droop, and then he fell today in the yard, so finally his wife brought him to the ER.  He has also had some generalized fatigue with walking and drooling form both sides of his mouth. Was found to have an Acute Pontine CVA and stroke workup is in progress.  Assessment & Plan:   Principal Problem:   Facial droop Active Problems:   CORONARY ARTERY DISEASE, S/P PTCA   Cerebral artery occlusion with cerebral infarction (HCC)   Benign essential hypertension   Controlled type 2 diabetes mellitus with stage 3 chronic kidney disease, with long-term current use of insulin (HCC)   Cerebral thrombosis with cerebral infarction   Normocytic anemia  Acute Pontine Lacunar CVA -New dysarthria, difficulty swallowing liquids, ataxia.  -Admitted to telemetry -CT Head w/o Contrast showed No definite CT evidence for acute intracranial abnormality. Atrophy and small vessel ischemic changes of the white matter. Old lacunar infarcts in the basal ganglia. MRI follow-up as indicated. There was alsoSinusitis. Coarse calcification within the opacified sphenoid sinus on the left, findings could be secondary to calcified mucoceleor polyp, or chronic bacterial sinusitis. Calcified sinus secretion can also be seen in the setting of fungal infection. -CTA Head and Neck showed No emergent large vessel occlusion.  No acute arterial finding.  Cervical carotid atherosclerosis with 50% left ICA origin stenosis. No significant stenosis in the right cervical carotid circulation. 60% proximal right subclavian atheromatous narrowing. No stenosis of the cervical vertebral arteries.  High-grade narrowing of the non dominant right V4 segment with comparatively diminished downstream opacification.  Generalized intracranial atherosclerosis of medium size vessels. notable high-grade left P1 and P2 segment stenoses. Chronic left sphenoid sinusitis with debris, progressed from 2008. -MRI showed acute pontine lacunar infarct. Numerous old pontine infarcts with advanced pontine atrophy. Old LEFT basal ganglia infarct. Mild to moderate chronic small vessel ischemic disease. Moderate parenchymal brain volume loss, advanced for age -Neurology/Stroke Team consulted and appreciate Recc's -ECHOCARDIOGRAM and Carotid U/S Pending -Neuro checks, NIHSS per protocol -Daily aspirin increase to 325 mg -Continue Simvastatin 40 mg po qHS -Lipid Panel Showed Cholesterol of 127, HDL of 35, LDL of 78, TG of 68, and VLDL of 14 -HbA1c was 7.4 -Allow Permissive Hypertension for now -PT/OT/SLP consultation; -SLP recommended Regular Diet with Nectar Thick Liquids and a MBS in AM; C/w Resource Thickenup Clear po TIDwm and Bedtime -PT/OT recommending Home Health PT/OT with Supervision/Assistance 24 hours -Per Neuro would need outpatient movement disorders consultation -Appreciate Neuro Team/Stroke Team Evaluation and Recc's -Defer to Neuro to add Plavix to Current Regimen   Insulin Dependent Diabetes Mellitus   -C/w Levemir 10 units sq Daily -Hold Home Glipizide -C/w Moderate Novolog SSI AC -CBG's ranging from 126-323 -HbA1c was 7.4  HTN:  -Allow Permissive hypertension for now -Hold Ramipril  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 -C/w ASA 325 mg po Daily, Metoprolol  50 mg po BID, Simvastatin 40 mg po qHS -Ramipril Held for Permissive HTN  Normocytic Anemia -Hb/Hct  went from 12.9/38.0 -> 12.3/37.1 -Continue to Monitor for S/Sx of Bleeding -Repeat CBC in AM  Hx of CVA with Hx of Left Hemiparesis -As above  DVT prophylaxis: Enoxaparin 40 mg sq q24h Code Status: FULL CODE Family Communication: Discussed with wife and daughter at bedside Disposition Plan: Complete Inpatient Workup  Consultants:   Neurology/Stroke Team  Procedures:  ECHOCARDIOGRAM PENDING as well as Vascular Carotid Duplex  Antimicrobials:  Anti-infectives    None     Subjective: Seen and examined at bedside and had no complaints. Feels slightly better. No nausea or Vomiting.   Objective: Vitals:   10/14/16 1020 10/14/16 1200 10/14/16 1536 10/14/16 1909  BP: (!) 186/75  129/63 (!) 143/70  Pulse:   66 70  Resp: 20 20 20 20   Temp: 98.7 F (37.1 C) 98.6 F (37 C) 98.7 F (37.1 C) 99.1 F (37.3 C)  TempSrc:   Oral Oral  SpO2: 96% 98% 98% 97%  Weight:      Height:        Intake/Output Summary (Last 24 hours) at 10/14/16 2058 Last data filed at 10/14/16 0551  Gross per 24 hour  Intake           360.42 ml  Output                0 ml  Net           360.42 ml   Filed Weights   10/13/16 1727 10/14/16 0209  Weight: 75.8 kg (167 lb) 75.8 kg (167 lb 1.6 oz)   Examination: Physical Exam:  Constitutional: WN/WD, NAD and appears calm and comfortable Eyes: Lids and conjunctivae normal, sclerae anicteric  ENMT: External Ears, Nose appear normal. Grossly normal hearing. Mucous membranes are moist.  Neck: Appears normal, supple, no cervical masses, normal ROM, no appreciable thyromegaly, no JVD Respiratory: Clear to auscultation bilaterally, no wheezing, rales, rhonchi or crackles. Normal respiratory effort and patient is not tachypenic. No accessory muscle use.  Cardiovascular: RRR, no murmurs / rubs / gallops. S1 and S2 auscultated. No extremity edema.  Abdomen: Soft, non-tender, non-distended. No masses palpated. No appreciable hepatosplenomegaly. Bowel sounds  positive.  GU: Deferred. Musculoskeletal: No clubbing / cyanosis of digits/nails. No joint deformity upper and lower extremities. Good ROM, no contractures. Skin: No rashes, lesions, ulcers. No induration; Warm and dry.  Neurologic: Patient has slight facial droop CN 2-12 grossly intact. Sensation intact in all 4 Extremities. Diminished strength in the LE. Romberg sign cerebellar reflexes not assessed.  Psychiatric: Normal judgment and insight. Alert and oriented x 3. Normal mood and appropriate affect.   Data Reviewed: I have personally reviewed following labs and imaging studies  CBC:  Recent Labs Lab 10/13/16 1708 10/13/16 1742 10/14/16 1203  WBC 6.5  --  6.2  NEUTROABS 3.4  --  3.0  HGB 13.0 12.9* 12.3*  HCT 39.5 38.0* 37.1*  MCV 91.2  --  90.0  PLT 226  --  213   Basic Metabolic Panel:  Recent Labs Lab 10/13/16 1708 10/13/16 1742 10/14/16 1203  NA 134* 139 135  K 4.0 4.0 3.5  CL 104 105 106  CO2 20*  --  24  GLUCOSE 143* 141* 103*  BUN 21* 24* 13  CREATININE 0.89 0.90 0.70  CALCIUM 9.1  --  8.5*  MG  --   --  1.9  PHOS  --   --  3.4   GFR: Estimated Creatinine Clearance: 90.8 mL/min (by C-G formula based on SCr of 0.7 mg/dL). Liver Function Tests:  Recent Labs Lab 10/13/16 1708 10/14/16 1203  AST 20 17  ALT 15* 15*  ALKPHOS 50 45  BILITOT 0.5 0.7  PROT 6.7 5.9*  ALBUMIN 3.9 3.6   No results for input(s): LIPASE, AMYLASE in the last 168 hours. No results for input(s): AMMONIA in the last 168 hours. Coagulation Profile:  Recent Labs Lab 10/13/16 1708  INR 0.92   Cardiac Enzymes:  Recent Labs Lab 10/13/16 1708  TROPONINI <0.03   BNP (last 3 results) No results for input(s): PROBNP in the last 8760 hours. HbA1C:  Recent Labs  10/14/16 0341  HGBA1C 7.4*   CBG:  Recent Labs Lab 10/14/16 0216 10/14/16 0607 10/14/16 1200 10/14/16 1636  GLUCAP 93 112* 323* 126*   Lipid Profile:  Recent Labs  10/14/16 0341  CHOL 127  HDL 35*    LDLCALC 78  TRIG 68  CHOLHDL 3.6   Thyroid Function Tests: No results for input(s): TSH, T4TOTAL, FREET4, T3FREE, THYROIDAB in the last 72 hours. Anemia Panel: No results for input(s): VITAMINB12, FOLATE, FERRITIN, TIBC, IRON, RETICCTPCT in the last 72 hours. Sepsis Labs: No results for input(s): PROCALCITON, LATICACIDVEN in the last 168 hours.  No results found for this or any previous visit (from the past 240 hour(s)).   Radiology Studies: Ct Angio Head W Or Wo Contrast  Result Date: 10/14/2016 CLINICAL DATA:  Follow-up possible stroke.  Abnormal gait. EXAM: CT ANGIOGRAPHY HEAD AND NECK TECHNIQUE: Multidetector CT imaging of the head and neck was performed using the standard protocol during bolus administration of intravenous contrast. Multiplanar CT image reconstructions and MIPs were obtained to evaluate the vascular anatomy. Carotid stenosis measurements (when applicable) are obtained utilizing NASCET criteria, using the distal internal carotid diameter as the denominator. CONTRAST:  50 cc Isovue 370 intravenous COMPARISON:  Brain MRI from today.  Intracranial MRA 06/03/2006 FINDINGS: CTA NECK FINDINGS Aortic arch: Atheromatous wall thickening and intermittent calcification. No dilatation or acute finding. Three vessel branching. Right carotid system: Atheromatous wall thickening of the common carotid. There is moderate calcified and noncalcified plaque at the bifurcation and bulb without flow limiting stenosis or ulceration. Left carotid system: Atheromatous wall thickening intermittently seen along the common carotid. Bulky calcified plaque at the origin of the left ICA with to 50% stenosis. No ulceration or dissection. Vertebral arteries: Proximal subclavian atherosclerosis. There is stenosis of the proximal right subclavian artery measuring 60%. Moderate plaque without narrowing at the origin of the non dominant right vertebral artery. Atherosclerotic plaque at the origin of the dominant  left vertebral artery without stenosis. Skeleton: No acute or aggressive finding. Extensive ACDF with solid arthrodesis. Advanced adjacent segment disc degeneration at C3-4. Other neck: Chronic left sphenoid sinusitis with partially calcified internal debris that has increased since 2008. Upper chest: No acute finding Review of the MIP images confirms the above findings CTA HEAD FINDINGS Anterior circulation: There is confluent atherosclerotic calcified plaque on the carotid siphons. Degree of plaque blooming limits visualization of the lumen. No focal high-grade narrowing suspected. Hypoplastic left A1 segment. Atherosclerotic irregularity of medium size vessels without branch occlusion or proximal flow limiting stenosis. Negative for aneurysm. Posterior circulation: Atheromatous narrowing of the mid right V4 segment with less intense luminal density downstream, although right vertebral artery is patent to the basilar. There is atherosclerosis of the left V4 segment without stenosis. The basilar is widely patent and fairly  smooth. There is advance narrowing of the left P1 and P2 segments. Right proximal P2 segment stenosis. Venous sinuses: Patent Anatomic variants: None significant Delayed phase: No abnormal intracranial enhancement. Chronic changes described on prior brain MRI. Review of the MIP images confirms the above findings IMPRESSION: 1. No emergent large vessel occlusion.  No acute arterial finding. 2. Cervical carotid atherosclerosis with 50% left ICA origin stenosis. No significant stenosis in the right cervical carotid circulation. 3. 60% proximal right subclavian atheromatous narrowing. No stenosis of the cervical vertebral arteries. 4. High-grade narrowing of the non dominant right V4 segment with comparatively diminished downstream opacification. 5. Generalized intracranial atherosclerosis of medium size vessels. Notable high-grade left P1 and P2 segment stenoses. 6. Chronic left sphenoid sinusitis  with debris, progressed from 2008. Electronically Signed   By: Marnee Spring M.D.   On: 10/14/2016 11:57   Ct Head Wo Contrast  Result Date: 10/13/2016 CLINICAL DATA:  Ataxia, weak, fall EXAM: CT HEAD WITHOUT CONTRAST TECHNIQUE: Contiguous axial images were obtained from the base of the skull through the vertex without intravenous contrast. COMPARISON:  MRI 06/03/2006, head CT 06/03/2006 FINDINGS: Brain: No acute territorial infarction, hemorrhage, or intracranial mass is seen. Old appearing lacunar infarcts in the bilateral basal ganglia. Moderate atrophy. Mild small vessel ischemic changes of the deep and periventricular white matter. Prominent ventricles are felt related to atrophy. Vascular: No hyperdense vessels.  Carotid artery calcifications. Skull: No fracture or suspicious bone lesion Sinuses/Orbits: Mucosal thickening in the ethmoid sinuses and frontal sinuses. Opacified left sphenoid sinus with coarse calcification in the central secretions. No acute orbital abnormality. Bilateral lens extraction Other: None IMPRESSION: 1. No definite CT evidence for acute intracranial abnormality. Atrophy and small vessel ischemic changes of the white matter. Old lacunar infarcts in the basal ganglia. MRI follow-up as indicated 2. Sinusitis. Coarse calcification within the opacified sphenoid sinus on the left, findings could be secondary to calcified mucocele or polyp, or chronic bacterial sinusitis. Calcified sinus secretion can also be seen in the setting of fungal infection. Electronically Signed   By: Jasmine Pang M.D.   On: 10/13/2016 19:18   Ct Angio Neck W Or Wo Contrast  Result Date: 10/14/2016 CLINICAL DATA:  Follow-up possible stroke.  Abnormal gait. EXAM: CT ANGIOGRAPHY HEAD AND NECK TECHNIQUE: Multidetector CT imaging of the head and neck was performed using the standard protocol during bolus administration of intravenous contrast. Multiplanar CT image reconstructions and MIPs were obtained to  evaluate the vascular anatomy. Carotid stenosis measurements (when applicable) are obtained utilizing NASCET criteria, using the distal internal carotid diameter as the denominator. CONTRAST:  50 cc Isovue 370 intravenous COMPARISON:  Brain MRI from today.  Intracranial MRA 06/03/2006 FINDINGS: CTA NECK FINDINGS Aortic arch: Atheromatous wall thickening and intermittent calcification. No dilatation or acute finding. Three vessel branching. Right carotid system: Atheromatous wall thickening of the common carotid. There is moderate calcified and noncalcified plaque at the bifurcation and bulb without flow limiting stenosis or ulceration. Left carotid system: Atheromatous wall thickening intermittently seen along the common carotid. Bulky calcified plaque at the origin of the left ICA with to 50% stenosis. No ulceration or dissection. Vertebral arteries: Proximal subclavian atherosclerosis. There is stenosis of the proximal right subclavian artery measuring 60%. Moderate plaque without narrowing at the origin of the non dominant right vertebral artery. Atherosclerotic plaque at the origin of the dominant left vertebral artery without stenosis. Skeleton: No acute or aggressive finding. Extensive ACDF with solid arthrodesis. Advanced adjacent segment disc degeneration at  C3-4. Other neck: Chronic left sphenoid sinusitis with partially calcified internal debris that has increased since 2008. Upper chest: No acute finding Review of the MIP images confirms the above findings CTA HEAD FINDINGS Anterior circulation: There is confluent atherosclerotic calcified plaque on the carotid siphons. Degree of plaque blooming limits visualization of the lumen. No focal high-grade narrowing suspected. Hypoplastic left A1 segment. Atherosclerotic irregularity of medium size vessels without branch occlusion or proximal flow limiting stenosis. Negative for aneurysm. Posterior circulation: Atheromatous narrowing of the mid right V4 segment  with less intense luminal density downstream, although right vertebral artery is patent to the basilar. There is atherosclerosis of the left V4 segment without stenosis. The basilar is widely patent and fairly smooth. There is advance narrowing of the left P1 and P2 segments. Right proximal P2 segment stenosis. Venous sinuses: Patent Anatomic variants: None significant Delayed phase: No abnormal intracranial enhancement. Chronic changes described on prior brain MRI. Review of the MIP images confirms the above findings IMPRESSION: 1. No emergent large vessel occlusion.  No acute arterial finding. 2. Cervical carotid atherosclerosis with 50% left ICA origin stenosis. No significant stenosis in the right cervical carotid circulation. 3. 60% proximal right subclavian atheromatous narrowing. No stenosis of the cervical vertebral arteries. 4. High-grade narrowing of the non dominant right V4 segment with comparatively diminished downstream opacification. 5. Generalized intracranial atherosclerosis of medium size vessels. Notable high-grade left P1 and P2 segment stenoses. 6. Chronic left sphenoid sinusitis with debris, progressed from 2008. Electronically Signed   By: Marnee Spring M.D.   On: 10/14/2016 11:57   Mr Brain Wo Contrast  Result Date: 10/14/2016 CLINICAL DATA:  Two days of dysarthria and gait imbalance. History of stroke, diabetes, hypertension. EXAM: MRI HEAD WITHOUT CONTRAST TECHNIQUE: Multiplanar, multiecho pulse sequences of the brain and surrounding structures were obtained without intravenous contrast. COMPARISON:  CT HEAD October 13, 2016 and MRI of the head June 03, 2006 FINDINGS: BRAIN: 5 mm focus reduced diffusion LEFT pons with low ADC values. Faint susceptibility artifact LEFT basal ganglia associated with old infarcts. No susceptibility artifact to suggest acute blood products. Ex vacuo dilatation LEFT lateral ventricle, no hydrocephalus. Parenchymal brain volume loss, advanced for age. Old  pontine lacunar infarcts and advanced atrophy. Patchy supratentorial white matter FLAIR T2 hyperintensities. No midline shift, mass effect or masses. No abnormal extra-axial fluid collections. VASCULAR: Normal major intracranial vascular flow voids present at skull base. SKULL AND UPPER CERVICAL SPINE: No abnormal sellar expansion. No suspicious calvarial bone marrow signal. Craniocervical junction maintained. SINUSES/ORBITS: Chronic LEFT sphenoid sinusitis with low signal inspissated mucus. Trace mastoid effusions versus bone marrow signal. The included ocular globes and orbital contents are non-suspicious. Status post bilateral ocular lens implants. OTHER: Patient is edentulous. IMPRESSION: 1. Acute pontine lacunar infarct. 2. Numerous old pontine infarcts with advanced pontine atrophy. Old LEFT basal ganglia infarct. 3. Mild to moderate chronic small vessel ischemic disease. 4. Moderate parenchymal brain volume loss, advanced for age. Electronically Signed   By: Awilda Metro M.D.   On: 10/14/2016 02:01   Scheduled Meds: . aspirin  300 mg Rectal Daily   Or  . aspirin  325 mg Oral Daily  . enoxaparin (LOVENOX) injection  40 mg Subcutaneous Q24H  . insulin aspart  0-15 Units Subcutaneous TID WC  . insulin detemir  10 Units Subcutaneous Daily  . metoprolol tartrate  50 mg Oral BID  . RESOURCE THICKENUP CLEAR   Oral TID WC & HS  . simvastatin  40 mg Oral  q1800   Continuous Infusions: . sodium chloride 50 mL/hr at 10/14/16 1450    LOS: 0 days   Merlene Laughter, DO [facility] Pager [phone]  If 7PM-7AM, please contact night-coverage [email or web address] Password [facility] 10/14/2016, 8:58 PM

## 2016-10-14 NOTE — Evaluation (Signed)
Physical Therapy Evaluation Patient Details Name: Grant Sparks MRN: [id number] DOB: 06/15/1945 Today's Date: 10/14/2016   History of Present Illness  Patient is a 71 y/o male who presents with gait imbalance and dysarthria. MRI- acute pontine lacunar infarct. PMH includes CVA, MI, CAD, DM.  Clinical Impression  Patient presents with left sided weakness, dysarthria and impaired balance s/p above. Tolerated gait training with Min A for balance/safety. Pt wears AFO on LLE from prior CVA and has decreased foot clearance and staggering like gait today. Pt Mod I PTA and performs his own ADLs. Has to negotiate steps to get to bedroom and to enter home. Pt high fall risk at this time. May try DME next session for better stability. Will follow acutely to maximize independence and mobility prior to return home.     Follow Up Recommendations Home health PT;Supervision for mobility/OOB    Equipment Recommendations  None recommended by PT    Recommendations for Other Services OT consult;Speech consult     Precautions / Restrictions Precautions Precautions: Fall Required Braces or Orthoses: Other Brace/Splint Other Brace/Splint: left AFO Restrictions Weight Bearing Restrictions: No      Mobility  Bed Mobility Overal bed mobility: Needs Assistance Bed Mobility: Supine to Sit;Sit to Supine     Supine to sit: Supervision;HOB elevated Sit to supine: Supervision;HOB elevated   General bed mobility comments: Use of rail to get to EOB, no assist needed.   Transfers Overall transfer level: Needs assistance Equipment used: None Transfers: Sit to/from Stand Sit to Stand: Min guard         General transfer comment: Min guard to steady in standing.   Ambulation/Gait Ambulation/Gait assistance: Min assist Ambulation Distance (Feet): 120 Feet Assistive device: None Gait Pattern/deviations: Step-through pattern;Decreased stride length;Decreased dorsiflexion - left;Staggering left;Staggering  right;Narrow base of support Gait velocity: decreased   General Gait Details: Pt with slow, unsteady gait; decreased foot clearance and tripping over LLE esp when fatigued. Reaching for rail for support; will try [facility] next time.  Stairs            Wheelchair Mobility    Modified Rankin (Stroke Patients Only) Modified Rankin (Stroke Patients Only) Pre-Morbid Rankin Score: Moderate disability Modified Rankin: Moderately severe disability     Balance Overall balance assessment: Needs assistance;History of Falls Sitting-balance support: Feet supported;No upper extremity supported Sitting balance-Leahy Scale: Good Sitting balance - Comments: Able to reach outside BoS and donn AFO and shoes   Standing balance support: During functional activity Standing balance-Leahy Scale: Fair Standing balance comment: Able to stand statically wihtout UE support, Increased sway noted during urination requiring UE support.                             Pertinent Vitals/Pain Pain Assessment: No/denies pain    Home Living Family/patient expects to be discharged to:: Private residence Living Arrangements: Spouse/significant other Available Help at Discharge: Family;Available 24 hours/day Type of Home: House Home Access: Stairs to enter Entrance Stairs-Rails: Right Entrance Stairs-Number of Steps: 3-4 Home Layout: Two level Home Equipment: Cane - single point;Walker - 2 wheels      Prior Function Level of Independence: Independent with assistive device(s)         Comments: uses SPC as needed. Does not drive.     Hand Dominance        Extremity/Trunk Assessment   Upper Extremity Assessment Upper Extremity Assessment: Defer to OT evaluation    Lower Extremity  Assessment Lower Extremity Assessment: LLE deficits/detail LLE Deficits / Details: Grossly ~2+/5 knee extension, 3/5 knee flexion, 3/5 hip flexion, 2/5 DF. LLE Sensation:  [facility])       Communication    Communication: Expressive difficulties  Cognition Arousal/Alertness: Awake/alert Behavior During Therapy: WFL for tasks assessed/performed Overall Cognitive Status: Within Functional Limits for tasks assessed                                 General Comments: Slow to respond to questions but accurate responses.       General Comments General comments (skin integrity, edema, etc.): Wife present during session.    Exercises     Assessment/Plan    PT Assessment Patient needs continued PT services  PT Problem List Decreased strength;Decreased mobility;Decreased balance       PT Treatment Interventions Therapeutic activities;Gait training;Therapeutic exercise;Patient/family education;Balance training;Stair training;Functional mobility training;Neuromuscular re-education    PT Goals (Current goals can be found in the Care Plan section)  Acute Rehab PT Goals Patient Stated Goal: to go home PT Goal Formulation: With patient Time For Goal Achievement: 10/28/16 Potential to Achieve Goals: Good    Frequency Min 4X/week   Barriers to discharge Inaccessible home environment stairs to enter home and get to bedroom    Co-evaluation               AM-PAC PT "6 Clicks" Daily Activity  Outcome Measure Difficulty turning over in bed (including adjusting bedclothes, sheets and blankets)?: None Difficulty moving from lying on back to sitting on the side of the bed? : None Difficulty sitting down on and standing up from a chair with arms (e.g., wheelchair, bedside commode, etc,.)?: None Help needed moving to and from a bed to chair (including a wheelchair)?: A Little Help needed walking in hospital room?: A Little Help needed climbing 3-5 steps with a railing? : A Lot 6 Click Score: 20    End of Session Equipment Utilized During Treatment: Gait belt Activity Tolerance: Patient tolerated treatment well Patient left: in bed;with call bell/phone within reach;with  family/visitor present;with bed alarm set Nurse Communication: Mobility status PT Visit Diagnosis: Unsteadiness on feet (R26.81);Hemiplegia and hemiparesis Hemiplegia - Right/Left: Left Hemiplegia - dominant/non-dominant: Non-dominant Hemiplegia - caused by: Cerebral infarction    Time: 5456-2563 PT Time Calculation (min) (ACUTE ONLY): 29 min   Charges:   PT Evaluation $PT Eval Moderate Complexity: 1 Mod PT Treatments $Gait Training: 8-22 mins   PT G Codes:   PT G-Codes **NOT FOR INPATIENT CLASS** Functional Assessment Tool Used: Clinical judgement Functional Limitation: Mobility: Walking and moving around Mobility: Walking and Moving Around Current Status (S9373): At least 20 percent but less than 40 percent impaired, limited or restricted Mobility: Walking and Moving Around Goal Status [phone]): At least 1 percent but less than 20 percent impaired, limited or restricted    [place], , [place] [phone]    Marcy Panning 10/14/2016, 7:52 AM

## 2016-10-14 NOTE — Evaluation (Addendum)
Clinical/Bedside Swallow Evaluation Patient Details  Name: Grant Sparks MRN: [id number] Date of Birth: 1945-10-16  Today's Date: 10/14/2016 Time: SLP Start Time (ACUTE ONLY): 0930 SLP Stop Time (ACUTE ONLY): 1000 SLP Time Calculation (min) (ACUTE ONLY): 30 min  Past Medical History:  Past Medical History:  Diagnosis Date  . CAD (coronary artery disease)    S/P stenting of the RCA x2 and PTCA of a PDA lesion in Jan 2004. Pt had a myoracidal infarction when he was in rehabilitation following a CVA.  [name] CVA (cerebral vascular accident) (HCC)    Right  CVA, left hemiparesis  . Diabetes mellitus without complication (HCC)   . MI (myocardial infarction) [facility])    Past Surgical History:  Past Surgical History:  Procedure Laterality Date  . APPENDECTOMY    . CATARACT EXTRACTION    . CERVICAL SPINE SURGERY    . coronary artery stent placement     20017   HPI:  71 year old male admitted 10/13/16 with dysarthria and gait disturbance. PMH significant for RCVA (2004), IDDM, CAD, MI. MRI = acute pontine lacunar infarct, numerous old pontinue infarcts with atrophy, old Left basal ganglia infarct. Pt reports drooling and difficulty with thin liquids via straw recently.   Assessment / Plan / Recommendation Clinical Impression  Pt completed oral care after set up. SLP cleaned upper and lower dentures and pt replaced them. Pt presents with mild right labial weakness, but exhibited no anterior leakage during po trials. Pt tolerated trials of nectar thick liquids, puree, and solid without overt s/s aspiration or decline in respiratory status. Pt reported recent onset of difficulty with thin liquids, and exhibited immmediate cough/throat clear response during this assessment. Chin tuck position did not eliminate cough response, raising suspicion for airway compromise.   Given bedside presentation and history, will begin regular solids with more conservative liquid recommendation of nectar thick liquids, and  will complete MBS tomorrow (Monday 10/15/16) to objectively assess swallow function and safety and identify least restrictive diet. RN and MD informed of results and recommendations. Family aware and in agreement.   SLP Visit Diagnosis: Dysphagia, oropharyngeal phase (R13.12)    Aspiration Risk  Mild aspiration risk    Diet Recommendation Regular;Nectar-thick liquid   Liquid Administration via: Cup;No straw Medication Administration: Whole meds with liquid Supervision: Patient able to self feed;Intermittent supervision to cue for compensatory strategies Compensations: Minimize environmental distractions;Slow rate;Small sips/bites Postural Changes: Seated upright at 90 degrees    Other  Recommendations Oral Care Recommendations: Oral care QID Other Recommendations: Order thickener from pharmacy   Follow up Recommendations        Frequency and Duration  (pending MBS)          Prognosis Prognosis for Safe Diet Advancement: Good      Swallow Study   General Date of Onset: 10/13/16 HPI: 71 year old male admitted 10/13/16 with dysarthria and gait disturbance. PMH significant for RCVA (2004), IDDM, CAD, MI. MRI = acute pontine lacunar infarct, numerous old pontinue infarcts with atrophy, old Left basal ganglia infarct. Pt reports drooling and difficulty with thin liquids via straw recently. Type of Study: Bedside Swallow Evaluation Previous Swallow Assessment: none Diet Prior to this Study: NPO Temperature Spikes Noted: No Respiratory Status: Room air History of Recent Intubation: No Behavior/Cognition: Alert;Cooperative;Pleasant mood Oral Cavity Assessment: Within Functional Limits Oral Care Completed by SLP: Yes Oral Cavity - Dentition: Dentures, bottom;Dentures, top Vision: Functional for self-feeding Self-Feeding Abilities: Able to feed self Patient Positioning: Upright in bed  Baseline Vocal Quality: Normal Volitional Cough: Strong Volitional Swallow: Able to elicit     Oral/Motor/Sensory Function Overall Oral Motor/Sensory Function: Mild impairment Facial ROM: Reduced right Facial Symmetry: Abnormal symmetry right Facial Strength: Within Functional Limits Facial Sensation: Within Functional Limits Lingual ROM: Within Functional Limits Lingual Symmetry: Within Functional Limits Lingual Strength: Within Functional Limits Lingual Sensation: Within Functional Limits Velum: Within Functional Limits Mandible: Within Functional Limits   Ice Chips Ice chips: Not tested   Thin Liquid Thin Liquid: Impaired Presentation: Cup;Self Fed Pharyngeal  Phase Impairments: Suspected delayed Swallow;Cough - Immediate;Throat Clearing - Immediate    Nectar Thick Nectar Thick Liquid: Within functional limits Presentation: Cup;Self Fed   Honey Thick Honey Thick Liquid: Not tested   Puree Puree: Within functional limits Presentation: Self Fed;Spoon   Solid   GO   Solid: Within functional limits Presentation: Self Fed    Functional Assessment Tool Used: asha noms, clinical judgment, BSE Functional Limitations: Swallowing Swallow Current Status (O7096): At least 20 percent but less than 40 percent impaired, limited or restricted Swallow Goal Status [phone]): At least 1 percent but less than 20 percent impaired, limited or restricted   Xayvier Vallez B. Lilygrace Rodick, MSP, CCC-SLP [phone]  Leigh Aurora 10/14/2016,10:40 AM

## 2016-10-15 ENCOUNTER — Ambulatory Visit (HOSPITAL_BASED_OUTPATIENT_CLINIC_OR_DEPARTMENT_OTHER): Payer: Medicare Other

## 2016-10-15 ENCOUNTER — Encounter (HOSPITAL_COMMUNITY): Payer: Medicare Other

## 2016-10-15 ENCOUNTER — Observation Stay (HOSPITAL_COMMUNITY): Payer: Medicare Other

## 2016-10-15 DIAGNOSIS — R29810 Facial weakness: Secondary | ICD-10-CM

## 2016-10-15 DIAGNOSIS — I5032 Chronic diastolic (congestive) heart failure: Secondary | ICD-10-CM

## 2016-10-15 DIAGNOSIS — I259 Chronic ischemic heart disease, unspecified: Secondary | ICD-10-CM | POA: Diagnosis not present

## 2016-10-15 DIAGNOSIS — I6350 Cerebral infarction due to unspecified occlusion or stenosis of unspecified cerebral artery: Secondary | ICD-10-CM | POA: Diagnosis not present

## 2016-10-15 DIAGNOSIS — I639 Cerebral infarction, unspecified: Secondary | ICD-10-CM | POA: Diagnosis not present

## 2016-10-15 DIAGNOSIS — I10 Essential (primary) hypertension: Secondary | ICD-10-CM | POA: Diagnosis not present

## 2016-10-15 LAB — GLUCOSE, CAPILLARY
GLUCOSE-CAPILLARY: 155 mg/dL — AB (ref 65–99)
Glucose-Capillary: 155 mg/dL — ABNORMAL HIGH (ref 65–99)
Glucose-Capillary: 236 mg/dL — ABNORMAL HIGH (ref 65–99)

## 2016-10-15 LAB — ECHOCARDIOGRAM COMPLETE
HEIGHTINCHES: 72 in
Weight: 2673.6 oz

## 2016-10-15 MED ORDER — CLOPIDOGREL BISULFATE 75 MG PO TABS: 75.0000 mg | ORAL_TABLET | Freq: Every day | ORAL | 0 refills | Status: DC

## 2016-10-15 MED ORDER — CLOPIDOGREL BISULFATE 75 MG PO TABS
75.0000 mg | ORAL_TABLET | Freq: Every day | ORAL | Status: DC
  Administered 2016-10-15: 75 mg via ORAL
  Filled 2016-10-15: qty 1

## 2016-10-15 NOTE — Progress Notes (Signed)
Physical Therapy Treatment Patient Details Name: Grant Sparks MRN: [id number] DOB: Dec 19, 1945 Today's Date: 10/15/2016    History of Present Illness Patient is a 71 y/o male who presents with gait imbalance and dysarthria. MRI- acute pontine lacunar infarct. PMH includes CVA, MI, CAD, DM.    PT Comments    Patient seen for activity progression and reassessment for POC. Patient ambulated with use of RW, improvements noted in stability. Patient also able to perform functional tasks and complete mobility with wifes supervision. Spoke with patient and wife at length regarding mobility concerns and progression of therapies. Patient and wife desire to progress to outpatient therapies stating that they have handled more significant deficits in the past and feel they would manage fine at home/ would rather pursue outpatient. Case manager updated. Will continue to see and progress as tolerated.   Follow Up Recommendations  Outpatient PT;Supervision/Assistance - 24 hour     Equipment Recommendations  None recommended by PT    Recommendations for Other Services OT consult;Speech consult     Precautions / Restrictions Precautions Precautions: Fall Required Braces or Orthoses: Other Brace/Splint Other Brace/Splint: left AFO Restrictions Weight Bearing Restrictions: No    Mobility  Bed Mobility Overal bed mobility: Needs Assistance Bed Mobility: Supine to Sit;Sit to Supine     Supine to sit: Supervision;HOB elevated Sit to supine: Supervision;HOB elevated   General bed mobility comments: Use of rail to get to EOB, no assist needed.   Transfers Overall transfer level: Needs assistance Equipment used: Rolling walker (2 wheeled) Transfers: Sit to/from Stand Sit to Stand: Min guard         General transfer comment: Min guard to steady in standing.   Ambulation/Gait Ambulation/Gait assistance: Min guard Ambulation Distance (Feet): 160 Feet Assistive device: Rolling walker (2  wheeled) Gait Pattern/deviations: Step-through pattern;Decreased stride length;Decreased dorsiflexion - left;Staggering left;Staggering right;Narrow base of support Gait velocity: decreased   General Gait Details: Pt with slow, unsteady gait; decreased foot clearance and tripping over LLE esp when fatigued. Reaching for rail for support; will try [facility] next time.   Stairs            Wheelchair Mobility    Modified Rankin (Stroke Patients Only) Modified Rankin (Stroke Patients Only) Pre-Morbid Rankin Score: Moderate disability Modified Rankin: Moderately severe disability     Balance Overall balance assessment: Needs assistance;History of Falls Sitting-balance support: Feet supported;No upper extremity supported Sitting balance-Leahy Scale: Good Sitting balance - Comments: Able to reach outside BoS and donn AFO and shoes   Standing balance support: During functional activity Standing balance-Leahy Scale: Fair Standing balance comment: static standing without UE support is fair, but improved with BUE support                            Cognition Arousal/Alertness: Awake/alert Behavior During Therapy: WFL for tasks assessed/performed Overall Cognitive Status: History of cognitive impairments - at baseline                                 General Comments: Accurately able to respond but requires increased time.       Exercises      General Comments        Pertinent Vitals/Pain      Home Living                      Prior  Function            PT Goals (current goals can now be found in the care plan section) Acute Rehab PT Goals Patient Stated Goal: to go home PT Goal Formulation: With patient Time For Goal Achievement: 10/28/16 Potential to Achieve Goals: Good Progress towards PT goals: Progressing toward goals    Frequency    Min 4X/week      PT Plan Discharge plan needs to be updated    Co-evaluation               AM-PAC PT "6 Clicks" Daily Activity  Outcome Measure  Difficulty turning over in bed (including adjusting bedclothes, sheets and blankets)?: None Difficulty moving from lying on back to sitting on the side of the bed? : None Difficulty sitting down on and standing up from a chair with arms (e.g., wheelchair, bedside commode, etc,.)?: None Help needed moving to and from a bed to chair (including a wheelchair)?: A Little Help needed walking in hospital room?: A Little Help needed climbing 3-5 steps with a railing? : A Lot 6 Click Score: 20    End of Session Equipment Utilized During Treatment: Gait belt Activity Tolerance: Patient tolerated treatment well Patient left: in bed;with call bell/phone within reach;with family/visitor present (sitting EOB with wife present) Nurse Communication: Mobility status PT Visit Diagnosis: Unsteadiness on feet (R26.81);Hemiplegia and hemiparesis Hemiplegia - Right/Left: Left Hemiplegia - dominant/non-dominant: Non-dominant Hemiplegia - caused by: Cerebral infarction     Time: 2993-7169 PT Time Calculation (min) (ACUTE ONLY): 24 min  Charges:  $Gait Training: 8-22 mins $Self Care/Home Management: 8-22                    G Codes:       Charlotte Crumb, PT DPT  Board Certified Neurologic Specialist [phone]    Fabio Asa 10/15/2016, 5:06 PM

## 2016-10-15 NOTE — Progress Notes (Signed)
  Echocardiogram 2D Echocardiogram has been performed.  Arvil Chaco 10/15/2016, 11:21 AM

## 2016-10-15 NOTE — Progress Notes (Signed)
Modified Barium Swallow Progress Note  Patient Details  Name: Grant Sparks MRN: [id number] Date of Birth: 06-08-1945  Today's Date: 10/15/2016  Modified Barium Swallow completed.  Full report located under Chart Review in the Imaging Section.  Brief recommendations include the following:  Clinical Impression  Pt has a mild pharyngeal dysphagia that is characterized by impaired coordination that results in airway compromise with thin liquids. He has good timing when taking single cup sips of thin liquid, but when he tries to take larger, consecutive straw sips it results in small amounts of aspiration that triggers a delayed, weak cough. Silent penetration to the vocal folds also occurred when he tried to swallow a mixed consistency bolus, even when using single cup sips at a time. He had adequate airway protection and improved coordination with nectar thick liquids and solids with no appreciable residuals remaining in his oropharynx, although he did have trace residue in his cervical esophagus felt to be related to reduced relaxation from cervical hardware. Recommend that try regular diet textures with thin liquids via cup (no straw), also avoiding mixed consistencies. SLP will continue to follow for tolerance at bedside, with nectar thick liquids being an appropriate option should he continue to have difficulty with thin liquids across meals.   Swallow Evaluation Recommendations       SLP Diet Recommendations: Regular solids;Thin liquid   Liquid Administration via: Cup;No straw   Medication Administration: Whole meds with puree   Supervision: Patient able to self feed;Intermittent supervision to cue for compensatory strategies   Compensations: Slow rate;Small sips/bites;Clear throat intermittently   Postural Changes: Seated upright at 90 degrees   Oral Care Recommendations: Oral care BID   Other Recommendations: Clarify dietary restrictions;Other (Comment) (no mixed  consistencies)    Maxcine Ham 10/15/2016,11:42 AM   Maxcine Ham, M.A. CCC-SLP [phone]

## 2016-10-15 NOTE — Progress Notes (Signed)
Pt discharged at this time with spouse.  Pt and spouse verbalize understanding of all discharge instructions, follow up appointments, and to where to pick up prescription.  Pt has all belongings with him including dentures, cell phone, ring and watch.

## 2016-10-15 NOTE — Progress Notes (Signed)
CTA neck done 8/19. Please advise if carotid duplex still needed.   Vascular lab [phone] Farrel Demark, RDMS, RVT

## 2016-10-15 NOTE — Progress Notes (Signed)
STROKE TEAM PROGRESS NOTE   HISTORY OF PRESENT ILLNESS (per record) Grant Sparks is an 71 y.o. male with a history of CAD, CVA, DM2, MI s/p stent who presented to the ED with 2-3 day history of unsteady gait and dysarthria. He fell in his yard on Saturday morning after an episode of gait festination during which he could not stop walking for 30 feet until he was stopped by a tree.  He also had dysphagia with occasional choking on liquids. His wife noticed a right facial droop as well as drooling from both sides of his mouth. He has a history of stroke in 2004 manifesting with left hemiparesis initially followed by resolution of deficits except for requirement of a left foot brace. In the ED he had a noticeable right facial droop. He had equal grips with no leg or arm drift. He has no diagnosis of Parkinsonism, but his older brother was recently diagnosed with Parkinson's disease. He has had problems with drooling for the past 2-3 weeks, manifesting when his head is bent forward. He denies a history of tremor but has had worsening gait unsteadiness with small steps over the past year.   Patient was not administered IV t-PA secondary to arriving outside of the treatment window. He was admitted to General Neurology for further evaluation and treatment.   SUBJECTIVE (INTERVAL HISTORY) No family at the bedside.  The patient is awake, alert, and follows all commands appropriately.  He states his speech has improved. Physical therapy recommends outpatient therapy   OBJECTIVE Temp:  [98.3 F (36.8 C)-99.1 F (37.3 C)] 98.4 F (36.9 C) (08/20 0952) Pulse Rate:  [70-77] 70 (08/20 0952) Cardiac Rhythm: Normal sinus rhythm (08/20 0700) Resp:  [18-20] 18 (08/20 0952) BP: (133-147)/(64-71) 147/65 (08/20 0952) SpO2:  [97 %-99 %] 99 % (08/20 0952)  CBC:   Recent Labs Lab 10/13/16 1708 10/13/16 1742 10/14/16 1203  WBC 6.5  --  6.2  NEUTROABS 3.4  --  3.0  HGB 13.0 12.9* 12.3*  HCT 39.5 38.0* 37.1*   MCV 91.2  --  90.0  PLT 226  --  213    Basic Metabolic Panel:   Recent Labs Lab 10/13/16 1708 10/13/16 1742 10/14/16 1203  NA 134* 139 135  K 4.0 4.0 3.5  CL 104 105 106  CO2 20*  --  24  GLUCOSE 143* 141* 103*  BUN 21* 24* 13  CREATININE 0.89 0.90 0.70  CALCIUM 9.1  --  8.5*  MG  --   --  1.9  PHOS  --   --  3.4    Lipid Panel:     Component Value Date/Time   CHOL 127 10/14/2016 0341   TRIG 68 10/14/2016 0341   HDL 35 (L) 10/14/2016 0341   CHOLHDL 3.6 10/14/2016 0341   VLDL 14 10/14/2016 0341   LDLCALC 78 10/14/2016 0341   HgbA1c:  Lab Results  Component Value Date   HGBA1C 7.4 (H) 10/14/2016   Urine Drug Screen: No results found for: LABOPIA, COCAINSCRNUR, LABBENZ, AMPHETMU, THCU, LABBARB  Alcohol Level No results found for: ETH  IMAGING  Ct Angio Head W Or Wo Contrast Ct Angio Neck W Or Wo Contrast 10/14/2016 IMPRESSION: 1. No emergent large vessel occlusion.  No acute arterial finding. 2. Cervical carotid atherosclerosis with 50% left ICA origin stenosis. No significant stenosis in the right cervical carotid circulation. 3. 60% proximal right subclavian atheromatous narrowing. No stenosis of the cervical vertebral arteries. 4. High-grade narrowing of the  non dominant right V4 segment with comparatively diminished downstream opacification. 5. Generalized intracranial atherosclerosis of medium size vessels. Notable high-grade left P1 and P2 segment stenoses. 6. Chronic left sphenoid sinusitis with debris, progressed from 2008.  Ct Head Wo Contrast 10/13/2016 IMPRESSION: 1. No definite CT evidence for acute intracranial abnormality. Atrophy and small vessel ischemic changes of the white matter. Old lacunar infarcts in the basal ganglia. MRI follow-up as indicated 2. Sinusitis. Coarse calcification within the opacified sphenoid sinus on the left, findings could be secondary to calcified mucocele or polyp, or chronic bacterial sinusitis. Calcified sinus secretion can  also be seen in the setting of fungal infection.  Mr Brain Wo Contrast 10/14/2016 IMPRESSION: 1. Acute pontine lacunar infarct. 2. Numerous old pontine infarcts with advanced pontine atrophy. Old LEFT basal ganglia infarct. 3. Mild to moderate chronic small vessel ischemic disease. 4. Moderate parenchymal brain volume loss, advanced for age.  Dg Swallowing Func-speech Pathology 10/15/2016 Diet Recommendations Regular solids;Thin liquid Liquid Administration via Cup;No straw; Follow up Recommendations Home health SLP;24 hour supervision/assistance      TTE 10/15/2016 Study Conclusions - Left ventricle: The cavity size was normal. Wall thickness was increased in a pattern of mild LVH. Systolic function was normal.  The estimated ejection fraction was in the range of 60% to 65%. Wall motion was normal; there were no regional wall motion abnormalities. Doppler parameters are consistent with abnormal left ventricular relaxation (grade 1 diastolic dysfunction). Impressions: - Normal LV systolic function; mild diastolic dysfunction; mild   LVH.    PHYSICAL EXAM Obese elderly African-American male currently not in distress. . Afebrile. Head is nontraumatic. Neck is supple without bruit.    Cardiac exam no murmur or gallop. Lungs are clear to auscultation. Distal pulses are well felt. Neurological Exam ;  Awake  Alert oriented x 3. Normal speech and language.eye movements full without nystagmus.fundi were not visualized. Vision acuity and fields appear normal. Hearing is normal. Palatal movements are normal. Face symmetric. Tongue midline. Normal strength, tone, reflexes and coordination. Normal sensation. Gait deferred.  ASSESSMENT/PLAN [name] is a 71 y.o. male with history of  CAD, CVA, DM2, MI s/p stent who presented to the ED with 2-3 day history of unsteady gait and dysarthria. He did not receive IV t-PA due to arriving outside of the treatment window.   Stroke: Acute pontine  lacunar infarct in setting multiple old pontine and basal ganglia lacunar infarcts, mild to moderate small vessel disease, advanced parenchymal atrophy, as well as 50% L ICA origin stenosis, and high-grade V4 left P1, and Left P2 segments.    Resultant  no deficits  CT head: no acute stroke.  Old basal ganglia lacunar infarcts.  MRI head: Acute pontine lacunar infarct  MRA head: not ordered  CTA head/neck: 50% L ICA origin stenosis, and high-grade V4 left P1, and Left P2 segments  Carotid Doppler  not needed  2D Echo: EF 60-65%. No source of embolus   LDL 78  HgbA1c 7.4  Lovenox 40 mg sq daily for VTE prophylaxis Diet heart healthy/carb modified Room service appropriate? Yes; Fluid consistency: Thin  aspirin 81 mg daily prior to admission, now on clopidogrel 75 mg daily  Patient counseled to be compliant with his antithrombotic medications  Ongoing aggressive stroke risk factor management  Therapy recommendations: Outpatient PT;Supervision/Assistance - 24 hour; Home health SLP  Disposition:  Home  Hypertension  Stable  Permissive hypertension (OK if < 220/120) but gradually normalize in 5-7 days  Long-term BP  goal normotensive  Hyperlipidemia  Home meds: simvastatin 40mg  PO daily, resumed in hospital  LDL 78, goal < 70  Continue statin at discharge  Diabetes  HgbA1c 7.4, goal < 7.0  Uncontrolled  Other Stroke Risk Factors  Advanced age  Hx stroke/TIA  Coronary artery disease  Other Active Problems  None  Hospital day # 0  I have personally examined this patient, reviewed notes, independently viewed imaging studies, participated in medical decision making and plan of care.ROS completed by me personally and pertinent positives fully documented  I have made any additions or clarifications directly to the above note. Agree with note above.  He presented with transient dysarthria and gait imbalance secondary to small pontine infarct from small vessel  disease. Recommend change aspirin to Plavix for stroke prevention and aggressive risk factor modification. Greater than 50% time during this 35 minute visit was spent on counseling and coordination of care about his lacunar infarct discussion about risk factors and treatment and answered questions.  Delia Heady, MD Medical Director [facility] Pager: [phone] 10/15/2016 5:39 PM   To contact Stroke Continuity provider, please refer to [email or web address]. After hours, contact General Neurology

## 2016-10-15 NOTE — Discharge Summary (Signed)
Physician Discharge Summary  Grant Sparks HVF:[id number] DOB: July 27, 1945 DOA: 10/13/2016  PCP: Kandyce Rud, MD  Admit date: 10/13/2016 Discharge date: 10/15/2016  Admitted From: Home Disposition:  Home with Home Health PT/OT/Aide  Recommendations for Outpatient Follow-up:  1. Follow up with PCP in 1-2 weeks 2. Follow up with Neurology Dr. Pearlean Brownie in 6-8 weeks 3. Hold Ramipril to allow for permissive HTN and gradually normalize in 5-7 days; Repeat Blood Pressure at PCP office in 3 days 4. Please obtain CMP/CBC, Mag, Phos in one week  Home Health: YES  Equipment/Devices: None Recommended by PT    Discharge Condition: Stable  CODE STATUS: FULL CODE Diet recommendation: Heart Healthy Carb Modified Diet with NO Straws  Brief/Interim Summary: Grant Neault Coxis a 71 y.o.malewith a past medical history significant for hx CVA w/o residual defs, IDDM, and CAD s/p PCI in 2004who presents with 2 days dysarthria and now gait imbalance.The patient was in his usual state of health until the last few days when he has noticed occasional choking on liquids, slurring of his speech, and gait imbalance (he describes this as "I can't get stopped when I get started"). His wife felt this morning he had right facial droop, and then he fell today in the yard, so finally his wife brought him to the ER. He has also had some generalized fatigue with walking and drooling form both sides of his mouth. Was found to have an Acute Pontine CVA. Neuro recommended changing ASA to Plavix and following up with Neurology as an outpatient in 8 weeks. PT/OT recommended home Health PT/Supervision.   Discharge Diagnoses:  Principal Problem:   Facial droop Active Problems:   CORONARY ARTERY DISEASE, S/P PTCA   Cerebral artery occlusion with cerebral infarction (HCC)   Benign essential hypertension   Controlled type 2 diabetes mellitus with stage 3 chronic kidney disease, with long-term current use of insulin (HCC)    Cerebral thrombosis with cerebral infarction   Normocytic anemia  Acute Pontine Lacunar CVA -New dysarthria, difficulty swallowing liquids, ataxia.  -Admitted to telemetry -CT Head w/o Contrast showed No definite CT evidence for acute intracranial abnormality. Atrophy and small vessel ischemic changes of the white matter. Old lacunar infarcts in the basal ganglia. MRI follow-up as indicated. There was alsoSinusitis. Coarse calcification within the opacified sphenoid sinus on the left, findings could be secondary to calcified mucoceleor polyp, or chronic bacterial sinusitis. Calcified sinus secretion can also be seen in the setting of fungal infection. -CTA Head and Neck showed No emergent large vessel occlusion. No acute arterial finding. Cervical carotid atherosclerosis with 50% left ICA origin stenosis. No significant stenosis in the right cervical carotid circulation. 60% proximal right subclavian atheromatous narrowing. No stenosis of the cervical vertebral arteries.  High-grade narrowing of the non dominant right V4 segment with comparatively diminished downstream opacification.  Generalized intracranial atherosclerosis of medium size vessels. notable high-grade left P1 and P2 segment stenoses. Chronic left sphenoid sinusitis with debris, progressed from 2008. -MRI showed acute pontine lacunar infarct. Numerous old pontine infarcts with advanced pontine atrophy. Old LEFT basal ganglia infarct. Mild to moderate chronic small vessel ischemic disease. Moderate parenchymal brain volume loss, advanced for age -ECHOCARDIOGRAM showed ejection fraction was in the range of 60% to 65%. Wall motion was normal; there were no regional wall motion abnormalities. Doppler parameters are consistent with abnormal  left ventricular relaxation (grade 1 diastolic dysfunction) -Neuro checks, NIHSS per protocol -Daily aspirin increase to 325 mg; Per Neuro Recc's recommended changing to Plavix  75 mg po Daily  -Continue  Simvastatin 40 mg po qHS -Lipid Panel Showed Cholesterol of 127, HDL of 35, LDL of 78, TG of 68, and VLDL of 14 -HbA1c was 7.4 -Allow Permissive Hypertension for now and Hold Ramipril until evaluated by PCP  -PT/OT/SLP consultation done  -SLP recommended Regular Diet with Nectar Thick Liquids and a MBS in AM; MBS Done and patient passed and was placed on Regular Diet with Thin Liquids and no Straws -PT/OT recommending Home Health PT/OT with Supervision/Assistance 24 hours -Per Neuro would need outpatient movement disorders consultation and will follow up with Dr. Pearlean Brownie in AM -Appreciate Neuro Team/Stroke Team Evaluation and Recc's -Follow up with Neurology Dr. Pearlean Brownie in 6-8 weeks   Insulin Dependent Diabetes Mellitus  -C/w Levemir 10 units sq Daily -Home Glipizide can be resumed  -HbA1c was 7.4  HTN: -Allow Permissive hypertension for now -Hold Ramipril for now and start in 5 days -C/w Metoprolol   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 -C/w ASA 325 mg po Daily, Metoprolol 50 mg po BID, Simvastatin 40 mg po qHS -Ramipril Held for Permissive HTN and can be restarted in outpatient setting   Normocytic Anemia -Hb/Hct went from 12.9/38.0 -> 12.3/37.1 -Continue to Monitor for S/Sx of Bleeding -Repeat CBC as an outpatient   Hx of CVA with Hx of Left Hemiparesis -As above  Discharge Instructions  Discharge Instructions    Ambulatory referral to Neurology    Complete by:  As directed    An appointment is requested in approximately: 6-8 weeks   Call MD for:  difficulty breathing, headache or visual disturbances    Complete by:  As directed    Call MD for:  extreme fatigue    Complete by:  As directed    Call MD for:  hives    Complete by:  As directed    Call MD for:  persistant dizziness or light-headedness    Complete by:  As directed    Call MD for:  persistant nausea and vomiting    Complete by:  As directed    Call MD for:  redness, tenderness, or signs of infection (pain,  swelling, redness, odor or green/yellow discharge around incision site)    Complete by:  As directed    Call MD for:  severe uncontrolled pain    Complete by:  As directed    Call MD for:  temperature >100.4    Complete by:  As directed    Diet - low sodium heart healthy    Complete by:  As directed    Discharge instructions    Complete by:  As directed    Follow up with PCP and Neurology as an outpatient. Take all medications as prescribed. If symptoms change or worsen please return to the ED for evaluation.   Increase activity slowly    Complete by:  As directed      Allergies as of 10/15/2016      Reactions   Ambien [zolpidem] Other (See Comments)   Hallucinate/manic   Tadalafil Other (See Comments)      Medication List    STOP taking these medications   aspirin 81 MG tablet   ramipril 10 MG capsule Commonly known as:  ALTACE     TAKE these medications   acetaminophen 500 MG tablet Commonly known as:  TYLENOL Take 1,000 mg by mouth every 6 (six) hours as needed for moderate pain.   clopidogrel 75 MG tablet Commonly known as:  PLAVIX Take 1 tablet (75 mg total) by mouth daily.   glipiZIDE 5 MG 24 hr tablet Commonly known as:  GLUCOTROL XL Take 5 mg by mouth every morning.   LEVEMIR FLEXPEN 100 UNIT/ML injection Generic drug:  insulin detemir Inject 16-18 Units into the skin See admin instructions. 18u in the morning, and 16 units at night   metFORMIN 1000 MG tablet Commonly known as:  GLUCOPHAGE Take 1,000 mg by mouth 2 (two) times daily.   metoprolol tartrate 50 MG tablet Commonly known as:  LOPRESSOR Take 50 mg by mouth 2 (two) times daily.   nitroGLYCERIN 0.4 MG SL tablet Commonly known as:  NITROSTAT Place 1 tablet (0.4 mg total) under the tongue as needed.   simvastatin 40 MG tablet Commonly known as:  ZOCOR Take 40 mg by mouth at bedtime.            Durable Medical Equipment        Start     Ordered   10/15/16 1649  For home use only  DME Walker rolling  [facility])  Once    Question:  Patient needs a walker to treat with the following condition  Answer:  CVA (cerebral vascular accident) (HCC)   10/15/16 1648      Allergies  Allergen Reactions  . Ambien [Zolpidem] Other (See Comments)    Hallucinate/manic  . Tadalafil Other (See Comments)    Consultations:  Neurology/Stroke Team  Procedures/Studies: Ct Angio Head W Or Wo Contrast  Result Date: 10/14/2016 CLINICAL DATA:  Follow-up possible stroke.  Abnormal gait. EXAM: CT ANGIOGRAPHY HEAD AND NECK TECHNIQUE: Multidetector CT imaging of the head and neck was performed using the standard protocol during bolus administration of intravenous contrast. Multiplanar CT image reconstructions and MIPs were obtained to evaluate the vascular anatomy. Carotid stenosis measurements (when applicable) are obtained utilizing NASCET criteria, using the distal internal carotid diameter as the denominator. CONTRAST:  50 cc Isovue 370 intravenous COMPARISON:  Brain MRI from today.  Intracranial MRA 06/03/2006 FINDINGS: CTA NECK FINDINGS Aortic arch: Atheromatous wall thickening and intermittent calcification. No dilatation or acute finding. Three vessel branching. Right carotid system: Atheromatous wall thickening of the common carotid. There is moderate calcified and noncalcified plaque at the bifurcation and bulb without flow limiting stenosis or ulceration. Left carotid system: Atheromatous wall thickening intermittently seen along the common carotid. Bulky calcified plaque at the origin of the left ICA with to 50% stenosis. No ulceration or dissection. Vertebral arteries: Proximal subclavian atherosclerosis. There is stenosis of the proximal right subclavian artery measuring 60%. Moderate plaque without narrowing at the origin of the non dominant right vertebral artery. Atherosclerotic plaque at the origin of the dominant left vertebral artery without stenosis. Skeleton: No acute or aggressive  finding. Extensive ACDF with solid arthrodesis. Advanced adjacent segment disc degeneration at C3-4. Other neck: Chronic left sphenoid sinusitis with partially calcified internal debris that has increased since 2008. Upper chest: No acute finding Review of the MIP images confirms the above findings CTA HEAD FINDINGS Anterior circulation: There is confluent atherosclerotic calcified plaque on the carotid siphons. Degree of plaque blooming limits visualization of the lumen. No focal high-grade narrowing suspected. Hypoplastic left A1 segment. Atherosclerotic irregularity of medium size vessels without branch occlusion or proximal flow limiting stenosis. Negative for aneurysm. Posterior circulation: Atheromatous narrowing of the mid right V4 segment with less intense luminal density downstream, although right vertebral artery is patent to the basilar. There is atherosclerosis of the left V4 segment without stenosis.  The basilar is widely patent and fairly smooth. There is advance narrowing of the left P1 and P2 segments. Right proximal P2 segment stenosis. Venous sinuses: Patent Anatomic variants: None significant Delayed phase: No abnormal intracranial enhancement. Chronic changes described on prior brain MRI. Review of the MIP images confirms the above findings IMPRESSION: 1. No emergent large vessel occlusion.  No acute arterial finding. 2. Cervical carotid atherosclerosis with 50% left ICA origin stenosis. No significant stenosis in the right cervical carotid circulation. 3. 60% proximal right subclavian atheromatous narrowing. No stenosis of the cervical vertebral arteries. 4. High-grade narrowing of the non dominant right V4 segment with comparatively diminished downstream opacification. 5. Generalized intracranial atherosclerosis of medium size vessels. Notable high-grade left P1 and P2 segment stenoses. 6. Chronic left sphenoid sinusitis with debris, progressed from 2008. Electronically Signed   By: Marnee Spring M.D.   On: 10/14/2016 11:57   Ct Head Wo Contrast  Result Date: 10/13/2016 CLINICAL DATA:  Ataxia, weak, fall EXAM: CT HEAD WITHOUT CONTRAST TECHNIQUE: Contiguous axial images were obtained from the base of the skull through the vertex without intravenous contrast. COMPARISON:  MRI 06/03/2006, head CT 06/03/2006 FINDINGS: Brain: No acute territorial infarction, hemorrhage, or intracranial mass is seen. Old appearing lacunar infarcts in the bilateral basal ganglia. Moderate atrophy. Mild small vessel ischemic changes of the deep and periventricular white matter. Prominent ventricles are felt related to atrophy. Vascular: No hyperdense vessels.  Carotid artery calcifications. Skull: No fracture or suspicious bone lesion Sinuses/Orbits: Mucosal thickening in the ethmoid sinuses and frontal sinuses. Opacified left sphenoid sinus with coarse calcification in the central secretions. No acute orbital abnormality. Bilateral lens extraction Other: None IMPRESSION: 1. No definite CT evidence for acute intracranial abnormality. Atrophy and small vessel ischemic changes of the white matter. Old lacunar infarcts in the basal ganglia. MRI follow-up as indicated 2. Sinusitis. Coarse calcification within the opacified sphenoid sinus on the left, findings could be secondary to calcified mucocele or polyp, or chronic bacterial sinusitis. Calcified sinus secretion can also be seen in the setting of fungal infection. Electronically Signed   By: Jasmine Pang M.D.   On: 10/13/2016 19:18   Ct Angio Neck W Or Wo Contrast  Result Date: 10/14/2016 CLINICAL DATA:  Follow-up possible stroke.  Abnormal gait. EXAM: CT ANGIOGRAPHY HEAD AND NECK TECHNIQUE: Multidetector CT imaging of the head and neck was performed using the standard protocol during bolus administration of intravenous contrast. Multiplanar CT image reconstructions and MIPs were obtained to evaluate the vascular anatomy. Carotid stenosis measurements (when  applicable) are obtained utilizing NASCET criteria, using the distal internal carotid diameter as the denominator. CONTRAST:  50 cc Isovue 370 intravenous COMPARISON:  Brain MRI from today.  Intracranial MRA 06/03/2006 FINDINGS: CTA NECK FINDINGS Aortic arch: Atheromatous wall thickening and intermittent calcification. No dilatation or acute finding. Three vessel branching. Right carotid system: Atheromatous wall thickening of the common carotid. There is moderate calcified and noncalcified plaque at the bifurcation and bulb without flow limiting stenosis or ulceration. Left carotid system: Atheromatous wall thickening intermittently seen along the common carotid. Bulky calcified plaque at the origin of the left ICA with to 50% stenosis. No ulceration or dissection. Vertebral arteries: Proximal subclavian atherosclerosis. There is stenosis of the proximal right subclavian artery measuring 60%. Moderate plaque without narrowing at the origin of the non dominant right vertebral artery. Atherosclerotic plaque at the origin of the dominant left vertebral artery without stenosis. Skeleton: No acute or aggressive finding. Extensive ACDF with solid  arthrodesis. Advanced adjacent segment disc degeneration at C3-4. Other neck: Chronic left sphenoid sinusitis with partially calcified internal debris that has increased since 2008. Upper chest: No acute finding Review of the MIP images confirms the above findings CTA HEAD FINDINGS Anterior circulation: There is confluent atherosclerotic calcified plaque on the carotid siphons. Degree of plaque blooming limits visualization of the lumen. No focal high-grade narrowing suspected. Hypoplastic left A1 segment. Atherosclerotic irregularity of medium size vessels without branch occlusion or proximal flow limiting stenosis. Negative for aneurysm. Posterior circulation: Atheromatous narrowing of the mid right V4 segment with less intense luminal density downstream, although right  vertebral artery is patent to the basilar. There is atherosclerosis of the left V4 segment without stenosis. The basilar is widely patent and fairly smooth. There is advance narrowing of the left P1 and P2 segments. Right proximal P2 segment stenosis. Venous sinuses: Patent Anatomic variants: None significant Delayed phase: No abnormal intracranial enhancement. Chronic changes described on prior brain MRI. Review of the MIP images confirms the above findings IMPRESSION: 1. No emergent large vessel occlusion.  No acute arterial finding. 2. Cervical carotid atherosclerosis with 50% left ICA origin stenosis. No significant stenosis in the right cervical carotid circulation. 3. 60% proximal right subclavian atheromatous narrowing. No stenosis of the cervical vertebral arteries. 4. High-grade narrowing of the non dominant right V4 segment with comparatively diminished downstream opacification. 5. Generalized intracranial atherosclerosis of medium size vessels. Notable high-grade left P1 and P2 segment stenoses. 6. Chronic left sphenoid sinusitis with debris, progressed from 2008. Electronically Signed   By: Marnee Spring M.D.   On: 10/14/2016 11:57   Mr Brain Wo Contrast  Result Date: 10/14/2016 CLINICAL DATA:  Two days of dysarthria and gait imbalance. History of stroke, diabetes, hypertension. EXAM: MRI HEAD WITHOUT CONTRAST TECHNIQUE: Multiplanar, multiecho pulse sequences of the brain and surrounding structures were obtained without intravenous contrast. COMPARISON:  CT HEAD October 13, 2016 and MRI of the head June 03, 2006 FINDINGS: BRAIN: 5 mm focus reduced diffusion LEFT pons with low ADC values. Faint susceptibility artifact LEFT basal ganglia associated with old infarcts. No susceptibility artifact to suggest acute blood products. Ex vacuo dilatation LEFT lateral ventricle, no hydrocephalus. Parenchymal brain volume loss, advanced for age. Old pontine lacunar infarcts and advanced atrophy. Patchy  supratentorial white matter FLAIR T2 hyperintensities. No midline shift, mass effect or masses. No abnormal extra-axial fluid collections. VASCULAR: Normal major intracranial vascular flow voids present at skull base. SKULL AND UPPER CERVICAL SPINE: No abnormal sellar expansion. No suspicious calvarial bone marrow signal. Craniocervical junction maintained. SINUSES/ORBITS: Chronic LEFT sphenoid sinusitis with low signal inspissated mucus. Trace mastoid effusions versus bone marrow signal. The included ocular globes and orbital contents are non-suspicious. Status post bilateral ocular lens implants. OTHER: Patient is edentulous. IMPRESSION: 1. Acute pontine lacunar infarct. 2. Numerous old pontine infarcts with advanced pontine atrophy. Old LEFT basal ganglia infarct. 3. Mild to moderate chronic small vessel ischemic disease. 4. Moderate parenchymal brain volume loss, advanced for age. Electronically Signed   By: Awilda Metro M.D.   On: 10/14/2016 02:01   Dg Swallowing Func-speech Pathology  Result Date: 10/15/2016 Objective Swallowing Evaluation: Type of Study: MBS-Modified Barium Swallow Study Patient Details Name: SATCHEL HEIDINGER MRN: [id number] Date of Birth: [birth year] Today's Date: 10/15/2016 Time: SLP Start Time (ACUTE ONLY): 1020-SLP Stop Time (ACUTE ONLY): 1035 SLP Time Calculation (min) (ACUTE ONLY): 15 min Past Medical History: Past Medical History: Diagnosis Date . CAD (coronary artery disease)   S/P stenting  of the RCA x2 and PTCA of a PDA lesion in Jan 2004. Pt had a myoracidal infarction when he was in rehabilitation following a CVA. [name] CVA (cerebral vascular accident) (HCC)   Right  CVA, left hemiparesis . Diabetes mellitus without complication (HCC)  . MI (myocardial infarction) [facility]  Past Surgical History: Past Surgical History: Procedure Laterality Date . APPENDECTOMY   . CATARACT EXTRACTION   . CERVICAL SPINE SURGERY   . coronary artery stent placement    7457 HPI: 71 year old male admitted  10/13/16 with dysarthria and gait disturbance. PMH significant for RCVA (2004), IDDM, CAD, MI. MRI = acute pontine lacunar infarct, numerous old pontinue infarcts with atrophy, old Left basal ganglia infarct. Pt reports drooling and difficulty with thin liquids via straw recently. He also reports a h/o dysphagia requriing thickened liquids after his stroke 14 years ago, but that he had transitioned back to thin liquids since then. Subjective: pt says he has been having trouble with thin liquids Assessment / Plan / Recommendation CHL IP CLINICAL IMPRESSIONS 10/15/2016 Clinical Impression Pt has a mild pharyngeal dysphagia that is characterized by impaired coordination that results in airway compromise with thin liquids. He has good timing when taking single cup sips of thin liquid, but when he tries to take larger, consecutive straw sips it results in small amounts of aspiration that triggers a delayed, weak cough. Silent penetration to the vocal folds also occurred when he tried to swallow a mixed consistency bolus, even when using single cup sips at a time. He had adequate airway protection and improved coordination with nectar thick liquids and solids with no appreciable residuals remaining in his oropharynx, although he did have trace residue in his cervical esophagus felt to be related to reduced relaxation from cervical hardware. Recommend that try regular diet textures with thin liquids via cup (no straw), also avoiding mixed consistencies. SLP will continue to follow for tolerance at bedside, with nectar thick liquids being an appropriate option should he continue to have difficulty with thin liquids across meals. SLP Visit Diagnosis Dysphagia, pharyngeal phase (R13.13) Attention and concentration deficit following -- Frontal lobe and executive function deficit following -- Impact on safety and function Mild aspiration risk   CHL IP TREATMENT RECOMMENDATION 10/15/2016 Treatment Recommendations Therapy as  outlined in treatment plan below   Prognosis 10/15/2016 Prognosis for Safe Diet Advancement Good Barriers to Reach Goals -- Barriers/Prognosis Comment -- CHL IP DIET RECOMMENDATION 10/15/2016 SLP Diet Recommendations Regular solids;Thin liquid Liquid Administration via Cup;No straw Medication Administration Whole meds with puree Compensations Slow rate;Small sips/bites;Clear throat intermittently Postural Changes Seated upright at 90 degrees   CHL IP OTHER RECOMMENDATIONS 10/15/2016 Recommended Consults -- Oral Care Recommendations Oral care BID Other Recommendations Clarify dietary restrictions;Other (Comment)   CHL IP FOLLOW UP RECOMMENDATIONS 10/15/2016 Follow up Recommendations Home health SLP;24 hour supervision/assistance   CHL IP FREQUENCY AND DURATION 10/15/2016 Speech Therapy Frequency (ACUTE ONLY) min 2x/week Treatment Duration 2 weeks      CHL IP ORAL PHASE 10/15/2016 Oral Phase WFL Oral - Pudding Teaspoon -- Oral - Pudding Cup -- Oral - Honey Teaspoon -- Oral - Honey Cup -- Oral - Nectar Teaspoon -- Oral - Nectar Cup -- Oral - Nectar Straw -- Oral - Thin Teaspoon -- Oral - Thin Cup -- Oral - Thin Straw -- Oral - Puree -- Oral - Mech Soft -- Oral - Regular -- Oral - Multi-Consistency -- Oral - Pill -- Oral Phase - Comment --  CHL IP PHARYNGEAL  PHASE 10/15/2016 Pharyngeal Phase Impaired Pharyngeal- Pudding Teaspoon -- Pharyngeal -- Pharyngeal- Pudding Cup -- Pharyngeal -- Pharyngeal- Honey Teaspoon -- Pharyngeal -- Pharyngeal- Honey Cup -- Pharyngeal -- Pharyngeal- Nectar Teaspoon -- Pharyngeal -- Pharyngeal- Nectar Cup WFL Pharyngeal -- Pharyngeal- Nectar Straw WFL Pharyngeal -- Pharyngeal- Thin Teaspoon -- Pharyngeal -- Pharyngeal- Thin Cup Delayed swallow initiation-pyriform sinuses;Penetration/Aspiration during swallow Pharyngeal Material enters airway, CONTACTS cords and not ejected out Pharyngeal- Thin Straw Delayed swallow initiation-pyriform sinuses;Penetration/Aspiration during swallow Pharyngeal  Material enters airway, passes BELOW cords and not ejected out despite cough attempt by patient Pharyngeal- Puree WFL Pharyngeal -- Pharyngeal- Mechanical Soft -- Pharyngeal -- Pharyngeal- Regular WFL Pharyngeal -- Pharyngeal- Multi-consistency -- Pharyngeal -- Pharyngeal- Pill WFL Pharyngeal -- Pharyngeal Comment --  CHL IP CERVICAL ESOPHAGEAL PHASE 10/15/2016 Cervical Esophageal Phase Impaired Pudding Teaspoon -- Pudding Cup -- Honey Teaspoon -- Honey Cup -- Nectar Teaspoon -- Nectar Cup Reduced cricopharyngeal relaxation;Esophageal backflow into cervical esophagus Nectar Straw Reduced cricopharyngeal relaxation;Esophageal backflow into cervical esophagus Thin Teaspoon -- Thin Cup Reduced cricopharyngeal relaxation;Esophageal backflow into cervical esophagus Thin Straw Reduced cricopharyngeal relaxation;Esophageal backflow into cervical esophagus Puree WFL Mechanical Soft -- Regular WFL Multi-consistency -- Pill WFL Cervical Esophageal Comment -- CHL IP GO 10/15/2016 Functional Assessment Tool Used skilled clinical judgment Functional Limitations Swallowing Swallow Current Status (Z6109) CI Swallow Goal Status (U0454) CI Swallow Discharge Status (U9811) (None) Motor Speech Current Status (B1478) (None) Motor Speech Goal Status (G9562) (None) Motor Speech Goal Status (Z3086) (None) Spoken Language Comprehension Current Status (V7846) (None) Spoken Language Comprehension Goal Status (N6295) (None) Spoken Language Comprehension Discharge Status (M8413) (None) Spoken Language Expression Current Status (K4401) (None) Spoken Language Expression Goal Status (U2725) (None) Spoken Language Expression Discharge Status (D6644) (None) Attention Current Status (I3474) (None) Attention Goal Status (Q5956) (None) Attention Discharge Status (L8756) (None) Memory Current Status (E3329) (None) Memory Goal Status (J1884) (None) Memory Discharge Status (Z6606) (None) Voice Current Status (T0160) (None) Voice Goal Status (F0932) (None)  Voice Discharge Status (T5573) (None) Other Speech-Language Pathology Functional Limitation Current Status (U2025) (None) Other Speech-Language Pathology Functional Limitation Goal Status (K2706) (None) Other Speech-Language Pathology Functional Limitation Discharge Status [phone]) (None) Maxcine Ham 10/15/2016, 11:42 AM  Maxcine Ham, M.A. CCC-SLP [phone]              ECHOCARDIOGRAM Study Conclusions  - Left ventricle: The cavity size was normal. Wall thickness was   increased in a pattern of mild LVH. Systolic function was normal.   The estimated ejection fraction was in the range of 60% to 65%.   Wall motion was normal; there were no regional wall motion   abnormalities. Doppler parameters are consistent with abnormal   left ventricular relaxation (grade 1 diastolic dysfunction).  Impressions:  - Normal LV systolic function; mild diastolic dysfunction; mild   LVH.  Subjective: Seen and examined and was doing well no complaints. No CP or SOB. Ready to go home.   Discharge Exam: Vitals:   10/15/16 0952 10/15/16 1740  BP: (!) 147/65 (!) 153/65  Pulse: 70 64  Resp: 18 20  Temp: 98.4 F (36.9 C) 99 F (37.2 C)  SpO2: 99% 99%   Vitals:   10/15/16 0453 10/15/16 0950 10/15/16 0952 10/15/16 1740  BP: 133/69 (!) 147/65 (!) 147/65 (!) 153/65  Pulse: 71 70 70 64  Resp: 20  18 20   Temp: 98.3 F (36.8 C)  98.4 F (36.9 C) 99 F (37.2 C)  TempSrc: Oral  Oral Oral  SpO2: 98%  99% 99%  Weight:      Height:       General: Pt is alert, awake, not in acute distress Cardiovascular: RRR, S1/S2 +, no rubs, no gallops Respiratory: CTA bilaterally, no wheezing, no rhonchi Abdominal: Soft, NT, ND, bowel sounds + Extremities: no edema, no cyanosis  The results of significant diagnostics from this hospitalization (including imaging, microbiology, ancillary and laboratory) are listed below for reference.    Microbiology: No results found for this or any previous visit  (from the past 240 hour(s)).   Labs: BNP (last 3 results) No results for input(s): BNP in the last 8760 hours. Basic Metabolic Panel:  Recent Labs Lab 10/13/16 1708 10/13/16 1742 10/14/16 1203  NA 134* 139 135  K 4.0 4.0 3.5  CL 104 105 106  CO2 20*  --  24  GLUCOSE 143* 141* 103*  BUN 21* 24* 13  CREATININE 0.89 0.90 0.70  CALCIUM 9.1  --  8.5*  MG  --   --  1.9  PHOS  --   --  3.4   Liver Function Tests:  Recent Labs Lab 10/13/16 1708 10/14/16 1203  AST 20 17  ALT 15* 15*  ALKPHOS 50 45  BILITOT 0.5 0.7  PROT 6.7 5.9*  ALBUMIN 3.9 3.6   No results for input(s): LIPASE, AMYLASE in the last 168 hours. No results for input(s): AMMONIA in the last 168 hours. CBC:  Recent Labs Lab 10/13/16 1708 10/13/16 1742 10/14/16 1203  WBC 6.5  --  6.2  NEUTROABS 3.4  --  3.0  HGB 13.0 12.9* 12.3*  HCT 39.5 38.0* 37.1*  MCV 91.2  --  90.0  PLT 226  --  213   Cardiac Enzymes:  Recent Labs Lab 10/13/16 1708  TROPONINI <0.03   BNP: Invalid input(s): POCBNP CBG:  Recent Labs Lab 10/14/16 1636 10/14/16 2117 10/15/16 0608 10/15/16 1129 10/15/16 1659  GLUCAP 126* 142* 155* 236* 155*   D-Dimer No results for input(s): DDIMER in the last 72 hours. Hgb A1c  Recent Labs  10/14/16 0341  HGBA1C 7.4*   Lipid Profile  Recent Labs  10/14/16 0341  CHOL 127  HDL 35*  LDLCALC 78  TRIG 68  CHOLHDL 3.6   Thyroid function studies No results for input(s): TSH, T4TOTAL, T3FREE, THYROIDAB in the last 72 hours.  Invalid input(s): FREET3 Anemia work up No results for input(s): VITAMINB12, FOLATE, FERRITIN, TIBC, IRON, RETICCTPCT in the last 72 hours. Urinalysis    Component Value Date/Time   COLORURINE YELLOW 10/13/2016 2229   APPEARANCEUR CLEAR 10/13/2016 2229   LABSPEC 1.027 10/13/2016 2229   PHURINE 5.0 10/13/2016 2229   GLUCOSEU 50 (A) 10/13/2016 2229   HGBUR NEGATIVE 10/13/2016 2229   BILIRUBINUR NEGATIVE 10/13/2016 2229   KETONESUR NEGATIVE  10/13/2016 2229   PROTEINUR NEGATIVE 10/13/2016 2229   NITRITE NEGATIVE 10/13/2016 2229   LEUKOCYTESUR NEGATIVE 10/13/2016 2229   Sepsis Labs Invalid input(s): PROCALCITONIN,  WBC,  LACTICIDVEN Microbiology No results found for this or any previous visit (from the past 240 hour(s)).  Time coordinating discharge: 35 minutes  SIGNED:  Merlene Laughter, DO [facility] 10/15/2016, 8:34 PM Pager [phone]  If 7PM-7AM, please contact night-coverage [email or web address] Password [email or web address]

## 2016-10-15 NOTE — Care Management Note (Signed)
Case Management Note  Patient Details  Name: Grant Sparks MRN: [id number] Date of Birth: [birth year]  Subjective/Objective:                    Action/Plan: Pt discharging home with orders for [facility] services. CM met with the patient and his iwfe and provided a list of [place] agencies. They selected [facility]. Tim with [facility] notified and accepted the referral.  Pt with order for walker. Wife states he has a walker at home.  Wife to provide transportation home.   Expected Discharge Date:  10/15/16               Expected Discharge Plan:  [place]  In-House Referral:     Discharge planning Services  CM Consult  Post Acute Care Choice:  Home Health Choice offered to:  Patient, Spouse  DME Arranged:    DME Agency:     HH Arranged:  PT, OT, Nurse's Aide [place] Agency:  [facility] (now [facility])  Status of Service:  Completed, signed off  If discussed at [place] of Stay Meetings, dates discussed:    Additional Comments:  Pollie Friar, RN 10/15/2016, 5:35 PM

## 2016-10-18 ENCOUNTER — Other Ambulatory Visit: Payer: Self-pay

## 2016-10-18 NOTE — Patient Outreach (Signed)
[facility]) Care Management  10/18/2016  [address] 07/23/1945 [id number]     EMMI-STROKE RED ON EMMI ALERT Day # 1 Date: 10/17/16 Red Alert Reason: "Feeling worse overall? Yes"    Outreach attempt # 1  to patient. No answer. RN CM left HIPAA compliant voicemail message along with contact info.       Plan: RN CM will make outreach attempt to patient within one business day if no return call from patient.   Antionette Fairy, RN,BSN,CCM [facility] Care Management Telephonic Care Management Coordinator Direct Phone: [phone] Toll Free: [phone] Fax: [phone]

## 2016-10-19 ENCOUNTER — Other Ambulatory Visit: Payer: Self-pay

## 2016-10-19 NOTE — Patient Outreach (Signed)
[facility]) Care Management  10/19/2016  Grant Sparks 12-Jan-1946 [id number]    EMMI-STROKE RED ON EMMI ALERT Day # 1 Date: 10/17/16 Red Alert Reason: "Feeling worse overall? Yes"    Outreach attempt # 2  to patient. No answer at present.     Plan: RN CM will send unsuccessful outreach letter to patient and close case if no response within 10 business days.    Antionette Fairy, RN,BSN,CCM [facility] Care Management Telephonic Care Management Coordinator Direct Phone: [phone] Toll Free: [phone] Fax: [phone]

## 2016-11-02 ENCOUNTER — Other Ambulatory Visit: Payer: Self-pay

## 2016-11-02 NOTE — Patient Outreach (Signed)
[facility] Care Management  11/02/2016  [place] NakayamaCharles E Double Feb 22, 1946 [id number]   EMMI-STROKE RED ON EMMI ALERT Day # 1 Date: 10/17/16 Red Alert Reason: "Feeling worse overall? Yes"    Multiple attempts to establish contact with patient without success. No response from letter mailed to patient. Case is being closed at this time.    Plan: RN CM will notify [facility] of case status.   Antionette Fairyoshanda Raynesha Tiedt, RN,BSN,CCM [facility] Care Management Telephonic Care Management Coordinator Direct Phone: [phone] Toll Free: [phone] Fax: [phone]

## 2016-12-03 ENCOUNTER — Encounter: Payer: Self-pay | Admitting: Neurology

## 2016-12-03 ENCOUNTER — Ambulatory Visit (INDEPENDENT_AMBULATORY_CARE_PROVIDER_SITE_OTHER): Payer: Medicare Other | Admitting: Neurology

## 2016-12-03 VITALS — BP 144/75 | HR 66 | Ht 72.0 in | Wt 175.6 lb

## 2016-12-03 DIAGNOSIS — I6381 Other cerebral infarction due to occlusion or stenosis of small artery: Secondary | ICD-10-CM

## 2016-12-03 MED ORDER — ASPIRIN EC 325 MG PO TBEC: 325.0000 mg | DELAYED_RELEASE_TABLET | Freq: Every day | ORAL | 0 refills | Status: DC

## 2016-12-03 NOTE — Progress Notes (Signed)
[facility] [address] [place]. [place] [address] [phone]       OFFICE FOLLOW-UP NOTE  [name] Date of Birth:  03-19-1945 Medical Record Number:  [id number]   HPI: [name] is a 71 year Caucasian male seen today for first office follow-up visit following hospital admission for stroke in August 2018. He is accompanied by his wife. History is obtained from them and review of electronic medical chart and have personally reviewed imaging films.Grant Sparks an 71 y.o.male with a history of CAD, CVA, DM2, MI s/p stent who presented to the ED with 2-3 day history of unsteady gait and dysarthria. He fell in his yard on Saturday morning after an episode of gait festination during which he could not stop walking for 30 feet until he was stopped by a tree.  He also had dysphagia with occasional choking on liquids. His wife noticed a right facial droop as well as drooling from both sides of his mouth. He has a history of stroke in 2004 manifesting with left hemiparesis initially followed by resolution of deficits except for requirement of a left foot brace. In the ED he had a noticeable right facial droop. He had equal grips with no leg or arm drift. He has no diagnosis of Parkinsonism, but his older brother was recently diagnosed with Parkinson's disease. He has had problems with drooling for the past 2-3 weeks, manifesting when his head is bent forward. He denies a history of tremor but has had worsening gait unsteadiness with small steps over the past year.  Patient was not administered IV t-PA secondary to arriving outside of the treatment window. He was admitted to General Neurology for further evaluation and treatment. MRI scan of the brain confirmed acute pontine lacunar infarct. CT angiogram of the head and neck showed 50% left ICA origin stenosis and high-grade left V4 left P1 and left P2 segment stenosis. Transthoracic echo showed normal ejection fraction. LDL  cholesterol was 78 mg percent. Hemoglobin A1c was 7.4. Patient was seen by physical occupational therapy and discharge home with home therapies. He states his done well his. His speech has improved but it still little slow. His balance is still slightly off but he can walk independently. Is finished therapies at home. Patient has a new complaint of bowel incontinence. He feels this is related to Plavix since he did not have it before the stroke. She denies any diarrhea or abdominal pain and discomfort. He states his blood pressure is well controlled and today it is 144/75. He is tolerating Zocor well without muscle aches and pains. He states his sugars have been well controlled.  ROS:   14 system review of systems is positive for  fatigue, and drooling, diarrhea, incontinence of bowels, easy bruising and bleeding, weakness, back pain, walking difficulty and all other systems negative  PMH:  Past Medical History:  Diagnosis Date  . CAD (coronary artery disease)    S/P stenting of the RCA x2 and PTCA of a PDA lesion in Jan 2004. Pt had a myoracidal infarction when he was in rehabilitation following a CVA.  Grant Sparks CVA (cerebral vascular accident) (HCC)    Right  CVA, left hemiparesis  . Diabetes mellitus without complication (HCC)   . MI (myocardial infarction) [facility])     Social History:  Social History   Social History  . Marital status: Married    Spouse name: N/A  . Number of children: N/A  . Years of education: N/A  Occupational History  . CPA    Social History Main Topics  . Smoking status: Never Smoker  . Smokeless tobacco: Never Used  . Alcohol use No  . Drug use: No  . Sexual activity: Not on file   Other Topics Concern  . Not on file   Social History Narrative   Lives in [place]   Has been married over 35 years   2 adult children and 5 grandchildren    Medications:   Current Outpatient Prescriptions on File Prior to Visit  Medication Sig Dispense Refill  . acetaminophen  (TYLENOL) 500 MG tablet Take 1,000 mg by mouth every 6 (six) hours as needed for moderate pain.    Grant Sparks glipiZIDE (GLUCOTROL XL) 5 MG 24 hr tablet Take 5 mg by mouth every morning.     . insulin detemir (LEVEMIR FLEXPEN) 100 UNIT/ML injection Inject 16-18 Units into the skin See admin instructions. 18u in the morning, and 16 units at night    . metFORMIN (GLUCOPHAGE) 1000 MG tablet Take 1,000 mg by mouth 2 (two) times daily.     . metoprolol (LOPRESSOR) 50 MG tablet Take 50 mg by mouth 2 (two) times daily.      . nitroGLYCERIN (NITROSTAT) 0.4 MG SL tablet Place 1 tablet (0.4 mg total) under the tongue as needed. 25 tablet 3  . simvastatin (ZOCOR) 40 MG tablet Take 40 mg by mouth at bedtime.      No current facility-administered medications on file prior to visit.     Allergies:   Allergies  Allergen Reactions  . Ambien [Zolpidem] Other (See Comments)    Hallucinate/manic  . Tadalafil Other (See Comments)    Physical Exam General: well developed, well nourished 71 Caucasian male, seated, in no evident distress Head: head normocephalic and atraumatic.  Neck: supple with no carotid or supraclavicular bruits Cardiovascular: regular rate and rhythm, no murmurs Musculoskeletal: no deformity Skin:  no rash/petichiae Vascular:  Normal pulses all extremities Vitals:   12/03/16 0909  BP: (!) 144/75  Pulse: 66   Neurologic Exam Mental Status: Awake and fully alert. Oriented to place and time. Recent and remote memory intact. Attention span, concentration and fund of knowledge appropriate. Mood and affect appropriate.  Cranial Nerves: Fundoscopic exam reveals sharp disc margins. Pupils equal, briskly reactive to light. Extraocular movements full without nystagmus. Visual fields full to confrontation. Hearing Mildly diminished bilaterally. Facial sensation intact butt mild right lower facial weakness , tongue, palate moves normally and symmetrically.  Motor: Normal bulk and tone. Normal  strength in all tested extremity muscles Except left foot drop and ankle dorsiflexor weakness. Mild weakness of left grip and intrinsic hand muscles.. Sensory.: intact to touch ,pinprick .position and vibratory sensation.  Coordination: Rapid alternating movements normal in all extremities. Finger-to-nose and heel-to-shin performed accurately bilaterally. Gait and Station: Arises from chair without difficulty. Stance is normal. Gait demonstrates normal stride length and balance But drags the left leg . Unable to heel, toe and tandem walk without difficulty.  Reflexes: 1+ and symmetric. Toes downgoing.   NIHSS  1 Modified Rankin  2   ASSESSMENT: 71 year old Caucasian male with the left pontine lacunar infarct in August 2018 secondary to small vessel disease. Remote history of right brain subcortical infarct as well as MRI showing several silent infarct. Multiple vascular risk factors of hypertension, hyperlipidemia, diabetes and extracranial carotid disease.    PLAN: I had a long d/w patient and his wife about his recent lacunar stroke, risk for recurrent  stroke/TIAs, personally independently reviewed imaging studies and stroke evaluation results and answered questions. Discontinue Plavix since patient and family feel is related to this bowel incontinence and change to aspirin 325 mg instead. If the bowel symptoms persist beyond a month consider referral to a gastroenterologist.  for secondary stroke prevention and maintain strict control of hypertension with blood pressure goal below 130/90, diabetes with hemoglobin A1c goal below 6.5% and lipids with LDL cholesterol goal below 70 mg/dL. I also advised the patient to eat a healthy diet with plenty of whole grains, cereals, fruits and vegetables, exercise regularly and maintain ideal body weight .I advised the patient to use a cane at all times and we discussed fall and safety precautions. Followup in the future with my nurse practitioner in 6 months  or call earlier if necessary Greater than 50% of time during this 25 minute visit was spent on counseling,explanation of diagnosis of lacunar infarct, planning of further management, discussion with patient and family and coordination of care Delia Heady, MD  [facility] [address] [place], [place] [address]  Phone [phone] Fax [phone] Note: This document was prepared with digital dictation and possible smart phrase technology. Any transcriptional errors that result from this process are unintentional

## 2016-12-03 NOTE — Patient Instructions (Signed)
I had a long d/w patient and his wife about his recent lacunar stroke, risk for recurrent stroke/TIAs, personally independently reviewed imaging studies and stroke evaluation results and answered questions. Discontinue Plavix since patient and family feel is related to this bowel incontinence and change to aspirin 325 mg instead. If the bowel symptoms persist beyond a month consider referral to a gastroenterologist.  for secondary stroke prevention and maintain strict control of hypertension with blood pressure goal below 130/90, diabetes with hemoglobin A1c goal below 6.5% and lipids with LDL cholesterol goal below 70 mg/dL. I also advised the patient to eat a healthy diet with plenty of whole grains, cereals, fruits and vegetables, exercise regularly and maintain ideal body weight .I advised the patient to use a cane at all times and we discussed fall and safety precautions. Followup in the future with my nurse practitioner in 6 months or call earlier if necessary Stroke Prevention Some medical conditions and behaviors are associated with an increased chance of having a stroke. You may prevent a stroke by making healthy choices and managing medical conditions. How can I reduce my risk of having a stroke?  Stay physically active. Get at least 30 minutes of activity on most or all days.  Do not smoke. It may also be helpful to avoid exposure to secondhand smoke.  Limit alcohol use. Moderate alcohol use is considered to be: ? No more than 2 drinks per day for men. ? No more than 1 drink per day for nonpregnant women.  Eat healthy foods. This involves: ? Eating 5 or more servings of fruits and vegetables a day. ? Making dietary changes that address high blood pressure (hypertension), high cholesterol, diabetes, or obesity.  Manage your cholesterol levels. ? Making food choices that are high in fiber and low in saturated fat, trans fat, and cholesterol may control cholesterol levels. ? Take any  prescribed medicines to control cholesterol as directed by your health care provider.  Manage your diabetes. ? Controlling your carbohydrate and sugar intake is recommended to manage diabetes. ? Take any prescribed medicines to control diabetes as directed by your health care provider.  Control your hypertension. ? Making food choices that are low in salt (sodium), saturated fat, trans fat, and cholesterol is recommended to manage hypertension. ? Ask your health care provider if you need treatment to lower your blood pressure. Take any prescribed medicines to control hypertension as directed by your health care provider. ? If you are 50-4 years of age, have your blood pressure checked every 3-5 years. If you are 58 years of age or older, have your blood pressure checked every year.  Maintain a healthy weight. ? Reducing calorie intake and making food choices that are low in sodium, saturated fat, trans fat, and cholesterol are recommended to manage weight.  Stop drug abuse.  Avoid taking birth control pills. ? Talk to your health care provider about the risks of taking birth control pills if you are over 68 years old, smoke, get migraines, or have ever had a blood clot.  Get evaluated for sleep disorders (sleep apnea). ? Talk to your health care provider about getting a sleep evaluation if you snore a lot or have excessive sleepiness.  Take medicines only as directed by your health care provider. ? For some people, aspirin or blood thinners (anticoagulants) are helpful in reducing the risk of forming abnormal blood clots that can lead to stroke. If you have the irregular heart rhythm of atrial fibrillation, you should  be on a blood thinner unless there is a good reason you cannot take them. ? Understand all your medicine instructions.  Make sure that other conditions (such as anemia or atherosclerosis) are addressed. Get help right away if:  You have sudden weakness or numbness of the  face, arm, or leg, especially on one side of the body.  Your face or eyelid droops to one side.  You have sudden confusion.  You have trouble speaking (aphasia) or understanding.  You have sudden trouble seeing in one or both eyes.  You have sudden trouble walking.  You have dizziness.  You have a loss of balance or coordination.  You have a sudden, severe headache with no known cause.  You have new chest pain or an irregular heartbeat. Any of these symptoms may represent a serious problem that is an emergency. Do not wait to see if the symptoms will go away. Get medical help at once. Call your local emergency services (911 in [place] Do not drive yourself to the hospital. This information is not intended to replace advice given to you by your health care provider. Make sure you discuss any questions you have with your health care provider. Document Released: 03/22/2004 Document Revised: 07/21/2015 Document Reviewed: 08/15/2012 Elsevier Interactive Patient Education  2017 [employer]. Fall Prevention in the Home Falls can cause injuries and can affect people from all age groups. There are many simple things that you can do to make your home safe and to help prevent falls. What can I do on the outside of my home?  Regularly repair the edges of walkways and driveways and fix any cracks.  Remove high doorway thresholds.  Trim any shrubbery on the main path into your home.  Use bright outdoor lighting.  Clear walkways of debris and clutter, including tools and rocks.  Regularly check that handrails are securely fastened and in good repair. Both sides of any steps should have handrails.  Install guardrails along the edges of any raised decks or porches.  Have leaves, snow, and ice cleared regularly.  Use sand or salt on walkways during winter months.  In the garage, clean up any spills right away, including grease or oil spills. What can I do in the bathroom?  Use night  lights.  Install grab bars by the toilet and in the tub and shower. Do not use towel bars as grab bars.  Use non-skid mats or decals on the floor of the tub or shower.  If you need to sit down while you are in the shower, use a plastic, non-slip stool.  Keep the floor dry. Immediately clean up any water that spills on the floor.  Remove soap buildup in the tub or shower on a regular basis.  Attach bath mats securely with double-sided non-slip rug tape.  Remove throw rugs and other tripping hazards from the floor. What can I do in the bedroom?  Use night lights.  Make sure that a bedside light is easy to reach.  Do not use oversized bedding that drapes onto the floor.  Have a firm chair that has side arms to use for getting dressed.  Remove throw rugs and other tripping hazards from the floor. What can I do in the kitchen?  Clean up any spills right away.  Avoid walking on wet floors.  Place frequently used items in easy-to-reach places.  If you need to reach for something above you, use a sturdy step stool that has a grab bar.  Keep electrical cables out of the way.  Do not use floor polish or wax that makes floors slippery. If you have to use wax, make sure that it is non-skid floor wax.  Remove throw rugs and other tripping hazards from the floor. What can I do in the stairways?  Do not leave any items on the stairs.  Make sure that there are handrails on both sides of the stairs. Fix handrails that are broken or loose. Make sure that handrails are as long as the stairways.  Check any carpeting to make sure that it is firmly attached to the stairs. Fix any carpet that is loose or worn.  Avoid having throw rugs at the top or bottom of stairways, or secure the rugs with carpet tape to prevent them from moving.  Make sure that you have a light switch at the top of the stairs and the bottom of the stairs. If you do not have them, have them installed. What are some  other fall prevention tips?  Wear closed-toe shoes that fit well and support your feet. Wear shoes that have rubber soles or low heels.  When you use a stepladder, make sure that it is completely opened and that the sides are firmly locked. Have someone hold the ladder while you are using it. Do not climb a closed stepladder.  Add color or contrast paint or tape to grab bars and handrails in your home. Place contrasting color strips on the first and last steps.  Use mobility aids as needed, such as canes, walkers, scooters, and crutches.  Turn on lights if it is dark. Replace any light bulbs that burn out.  Set up furniture so that there are clear paths. Keep the furniture in the same spot.  Fix any uneven floor surfaces.  Choose a carpet design that does not hide the edge of steps of a stairway.  Be aware of any and all pets.  Review your medicines with your healthcare provider. Some medicines can cause dizziness or changes in blood pressure, which increase your risk of falling. Talk with your health care provider about other ways that you can decrease your risk of falls. This may include working with a physical therapist or trainer to improve your strength, balance, and endurance. This information is not intended to replace advice given to you by your health care provider. Make sure you discuss any questions you have with your health care provider. Document Released: 02/02/2002 Document Revised: 07/12/2015 Document Reviewed: 03/19/2014 Elsevier Interactive Patient Education  2017 [employer].

## 2017-06-04 ENCOUNTER — Ambulatory Visit: Payer: Medicare Other | Admitting: Nurse Practitioner

## 2018-02-08 ENCOUNTER — Encounter (HOSPITAL_COMMUNITY): Payer: Self-pay

## 2018-02-08 ENCOUNTER — Emergency Department (HOSPITAL_COMMUNITY)
Admission: EM | Admit: 2018-02-08 | Discharge: 2018-02-08 | Disposition: A | Discharge status: Home / Self Care | Payer: Medicare Other | Attending: Emergency Medicine | Admitting: Emergency Medicine

## 2018-02-08 ENCOUNTER — Other Ambulatory Visit: Payer: Self-pay

## 2018-02-08 ENCOUNTER — Emergency Department (HOSPITAL_COMMUNITY): Payer: Medicare Other

## 2018-02-08 DIAGNOSIS — R51 Headache: Secondary | ICD-10-CM | POA: Diagnosis present

## 2018-02-08 DIAGNOSIS — I252 Old myocardial infarction: Secondary | ICD-10-CM | POA: Diagnosis not present

## 2018-02-08 DIAGNOSIS — E119 Type 2 diabetes mellitus without complications: Secondary | ICD-10-CM | POA: Insufficient documentation

## 2018-02-08 DIAGNOSIS — Z7984 Long term (current) use of oral hypoglycemic drugs: Secondary | ICD-10-CM | POA: Diagnosis not present

## 2018-02-08 DIAGNOSIS — G44201 Tension-type headache, unspecified, intractable: Secondary | ICD-10-CM | POA: Insufficient documentation

## 2018-02-08 DIAGNOSIS — I2510 Atherosclerotic heart disease of native coronary artery without angina pectoris: Secondary | ICD-10-CM | POA: Diagnosis not present

## 2018-02-08 DIAGNOSIS — Z79899 Other long term (current) drug therapy: Secondary | ICD-10-CM | POA: Diagnosis not present

## 2018-02-08 DIAGNOSIS — Z8673 Personal history of transient ischemic attack (TIA), and cerebral infarction without residual deficits: Secondary | ICD-10-CM | POA: Insufficient documentation

## 2018-02-08 DIAGNOSIS — I10 Essential (primary) hypertension: Secondary | ICD-10-CM | POA: Insufficient documentation

## 2018-02-08 LAB — CBC WITH DIFFERENTIAL/PLATELET
Abs Immature Granulocytes: 0.04 10*3/uL (ref 0.00–0.07)
Basophils Absolute: 0 10*3/uL (ref 0.0–0.1)
Basophils Relative: 0 %
EOS ABS: 0.1 10*3/uL (ref 0.0–0.5)
EOS PCT: 1 %
HEMATOCRIT: 41.2 % (ref 39.0–52.0)
HEMOGLOBIN: 13.3 g/dL (ref 13.0–17.0)
IMMATURE GRANULOCYTES: 0 %
LYMPHS ABS: 2.6 10*3/uL (ref 0.7–4.0)
LYMPHS PCT: 29 %
MCH: 30.5 pg (ref 26.0–34.0)
MCHC: 32.3 g/dL (ref 30.0–36.0)
MCV: 94.5 fL (ref 80.0–100.0)
MONOS PCT: 10 %
Monocytes Absolute: 0.9 10*3/uL (ref 0.1–1.0)
Neutro Abs: 5.4 10*3/uL (ref 1.7–7.7)
Neutrophils Relative %: 60 %
Platelets: 208 10*3/uL (ref 150–400)
RBC: 4.36 MIL/uL (ref 4.22–5.81)
RDW: 11.9 % (ref 11.5–15.5)
WBC: 9.1 10*3/uL (ref 4.0–10.5)
nRBC: 0 % (ref 0.0–0.2)

## 2018-02-08 LAB — COMPREHENSIVE METABOLIC PANEL
ALT: 17 U/L (ref 0–44)
AST: 17 U/L (ref 15–41)
Albumin: 3.7 g/dL (ref 3.5–5.0)
Alkaline Phosphatase: 70 U/L (ref 38–126)
Anion gap: 12 (ref 5–15)
BILIRUBIN TOTAL: 0.4 mg/dL (ref 0.3–1.2)
BUN: 16 mg/dL (ref 8–23)
CO2: 22 mmol/L (ref 22–32)
Calcium: 9.2 mg/dL (ref 8.9–10.3)
Chloride: 100 mmol/L (ref 98–111)
Creatinine, Ser: 0.95 mg/dL (ref 0.61–1.24)
GFR calc Af Amer: >60 mL/min (ref >60)
GFR calc non Af Amer: >60 mL/min (ref >60)
GLUCOSE: 277 mg/dL — AB (ref 70–99)
POTASSIUM: 4.7 mmol/L (ref 3.5–5.1)
Sodium: 134 mmol/L — ABNORMAL LOW (ref 135–145)
Total Protein: 6.6 g/dL (ref 6.5–8.1)

## 2018-02-08 LAB — SEDIMENTATION RATE: SED RATE: 14 mm/h (ref 0–16)

## 2018-02-08 LAB — C-REACTIVE PROTEIN: CRP: 1.7 mg/dL — ABNORMAL HIGH (ref <1.0)

## 2018-02-08 MED ORDER — DIPHENHYDRAMINE HCL 50 MG/ML IJ SOLN
25.0000 mg | Freq: Once | INTRAMUSCULAR | Status: AC
  Administered 2018-02-08: 25 mg via INTRAVENOUS
  Filled 2018-02-08: qty 1

## 2018-02-08 MED ORDER — TETRACAINE HCL 0.5 % OP SOLN
2.0000 [drp] | Freq: Once | OPHTHALMIC | Status: DC
  Filled 2018-02-08: qty 4

## 2018-02-08 MED ORDER — FENTANYL CITRATE (PF) 100 MCG/2ML IJ SOLN: 50.0000 ug | INTRAMUSCULAR | Status: DC | PRN

## 2018-02-08 MED ORDER — METOCLOPRAMIDE HCL 5 MG/ML IJ SOLN
5.0000 mg | Freq: Once | INTRAMUSCULAR | Status: AC
  Administered 2018-02-08: 5 mg via INTRAVENOUS
  Filled 2018-02-08: qty 2

## 2018-02-08 MED ORDER — LABETALOL HCL 5 MG/ML IV SOLN
10.0000 mg | Freq: Once | INTRAVENOUS | Status: AC
Start: 2018-02-08 — End: 2018-02-08
  Administered 2018-02-08: 10 mg via INTRAVENOUS
  Filled 2018-02-08: qty 4

## 2018-02-08 MED ORDER — ACETAMINOPHEN 500 MG PO TABS
1000.0000 mg | ORAL_TABLET | Freq: Once | ORAL | Status: AC
  Administered 2018-02-08: 1000 mg via ORAL
  Filled 2018-02-08: qty 2

## 2018-02-08 NOTE — Discharge Instructions (Signed)
If you were given medicines take as directed.  If you are on coumadin or contraceptives realize their levels and effectiveness is altered by many different medicines.  If you have any reaction (rash, tongues swelling, other) to the medicines stop taking and see a physician.    If your blood pressure was elevated in the ER make sure you follow up for management with a primary doctor or return for chest pain, shortness of breath or stroke symptoms.  Please follow up as directed and return to the ER or see a physician for new or worsening symptoms.  Thank you. Vitals:   02/08/18 1900 02/08/18 1915 02/08/18 1930 02/08/18 1945  BP: (!) 179/80 (!) 165/68 (!) 160/71 (!) 155/72  Pulse: 62 64 64 66  Resp: 18 15 16 15   Temp:      TempSrc:      SpO2: 98% 98% 99% 98%  Weight:      Height:

## 2018-02-08 NOTE — ED Notes (Signed)
Medical student at bedside

## 2018-02-08 NOTE — ED Notes (Signed)
Patient transported to CT 

## 2018-02-08 NOTE — ED Triage Notes (Signed)
Pt arrives from home with headache, c/o headache today, intermittent headaches past two weeks.  Hx strokes, difficulty walking, L sided weakness.

## 2018-02-08 NOTE — ED Provider Notes (Signed)
[facility] EMERGENCY DEPARTMENT Provider Note   CSN: [id number] Arrival date & time: 02/08/18  1728     History   Chief Complaint Chief Complaint  Patient presents with  . Headache    HPI Grant Sparks is a 72 y.o. male.  Patient with history of stroke and left-sided weakness, coronary artery disease, anemia, on aspirin presents with high blood pressure and headache worsening for 2 weeks.  Especially the past 24 hours patient's had gradually worsening left sided headache.  No new neurologic symptoms.  More severe than normal headaches.  No fevers or chills.  Pain radiates from left posterior scalp to left frontal area.     Past Medical History:  Diagnosis Date  . CAD (coronary artery disease)    S/P stenting of the RCA x2 and PTCA of a PDA lesion in Jan 2004. Pt had a myoracidal infarction when he was in rehabilitation following a CVA.  [name] CVA (cerebral vascular accident) (HCC)    Right  CVA, left hemiparesis  . Diabetes mellitus without complication (HCC)   . MI (myocardial infarction) [facility])     Patient Active Problem List   Diagnosis Date Noted  . Cerebral thrombosis with cerebral infarction 10/14/2016  . Normocytic anemia 10/14/2016  . Facial droop 10/13/2016  . Benign essential hypertension 10/13/2016  . Controlled type 2 diabetes mellitus with stage 3 chronic kidney disease, with long-term current use of insulin (HCC) 10/13/2016  . CVA (cerebral vascular accident) (HCC)   . CAD 03/01/2008  . CORONARY ARTERY DISEASE, S/P PTCA 03/01/2008  . Cerebral artery occlusion with cerebral infarction (HCC) 03/01/2008    Past Surgical History:  Procedure Laterality Date  . APPENDECTOMY    . CATARACT EXTRACTION    . CERVICAL SPINE SURGERY    . coronary artery stent placement     2004        Home Medications    Prior to Admission medications   Medication Sig Start Date End Date Taking? Authorizing Provider  acetaminophen (TYLENOL) 500 MG tablet  Take 1,000 mg by mouth every 6 (six) hours as needed for moderate pain.    [provider]  aspirin EC 325 MG tablet Take 1 tablet (325 mg total) by mouth daily. 12/03/16   Micki Riley, MD  glipiZIDE (GLUCOTROL XL) 5 MG 24 hr tablet Take 5 mg by mouth every morning.  07/04/15   [provider]  insulin detemir (LEVEMIR FLEXPEN) 100 UNIT/ML injection Inject 16-18 Units into the skin See admin instructions. 18u in the morning, and 16 units at night    [provider]  metFORMIN (GLUCOPHAGE) 1000 MG tablet Take 1,000 mg by mouth 2 (two) times daily.     [provider]  metoprolol (LOPRESSOR) 50 MG tablet Take 50 mg by mouth 2 (two) times daily.      [provider]  nitroGLYCERIN (NITROSTAT) 0.4 MG SL tablet Place 1 tablet (0.4 mg total) under the tongue as needed. 07/19/15   [name], MD  ramipril (ALTACE) 5 MG capsule Take 5 mg by mouth daily.    [provider]  simvastatin (ZOCOR) 40 MG tablet Take 40 mg by mouth at bedtime.     [provider]    Family History Family History  Problem Relation Age of Onset  . Heart attack Father 42       MI  . Diabetes Father   . Heart attack Brother 21       MI  (  might have been a PE)  . Diabetes Brother   . Heart attack Mother [id number]  . Diabetes Mother   . Diabetes Sister     Social History Social History   Tobacco Use  . Smoking status: Never Smoker  . Smokeless tobacco: Never Used  Substance Use Topics  . Alcohol use: No  . Drug use: No     Allergies   Ambien [zolpidem] and Tadalafil   Review of Systems Review of Systems  Constitutional: Negative for chills and fever.  HENT: Negative for congestion.   Eyes: Negative for visual disturbance.  Respiratory: Negative for shortness of breath.   Cardiovascular: Negative for chest pain.  Gastrointestinal: Negative for abdominal pain and vomiting.  Genitourinary: Negative for dysuria and flank pain.  Musculoskeletal:  Negative for back pain, neck pain and neck stiffness.  Skin: Negative for rash.  Neurological: Positive for headaches. Negative for weakness, light-headedness and numbness.     Physical Exam Updated Vital Signs BP (!) 148/74   Pulse 68   Temp 97.7 F (36.5 C) (Oral)   Resp 20   Ht 6' (1.829 m)   Wt 73.5 kg   SpO2 98%   BMI 21.97 kg/m   Physical Exam Vitals signs and nursing note reviewed.  Constitutional:      Appearance: He is well-developed.  HENT:     Head: Normocephalic and atraumatic.  Eyes:     General:        Right eye: No discharge.        Left eye: No discharge.     Conjunctiva/sclera: Conjunctivae normal.  Neck:     Musculoskeletal: Normal range of motion and neck supple.     Trachea: No tracheal deviation.  Cardiovascular:     Rate and Rhythm: Normal rate and regular rhythm.  Pulmonary:     Effort: Pulmonary effort is normal.     Breath sounds: Normal breath sounds.  Abdominal:     General: There is no distension.     Palpations: Abdomen is soft.     Tenderness: There is no abdominal tenderness. There is no guarding.  Skin:    General: Skin is warm.     Findings: No rash.  Neurological:     Mental Status: He is alert and oriented to person, place, and time.     GCS: GCS eye subscore is 4. GCS verbal subscore is 5. GCS motor subscore is 6.     Cranial Nerves: No cranial nerve deficit.     Sensory: Sensation is intact.     Comments: Mild weakness left UE and LE chronic. 5+ strength RUE and RLE       ED Treatments / Results  Labs (all labs ordered are listed, but only abnormal results are displayed) Labs Reviewed  COMPREHENSIVE METABOLIC PANEL - Abnormal; Notable for the following components:      Result Value   Sodium 134 (*)    Glucose, Bld 277 (*)    All other components within normal limits  C-REACTIVE PROTEIN - Abnormal; Notable for the following components:   CRP 1.7 (*)    All other components within normal limits  CBC WITH  DIFFERENTIAL/PLATELET  SEDIMENTATION RATE    EKG None  Radiology Ct Head Wo Contrast  Result Date: 02/08/2018 CLINICAL DATA:  Headache. EXAM: CT HEAD WITHOUT CONTRAST TECHNIQUE: Contiguous axial images were obtained from the base of the skull through the vertex without intravenous contrast. COMPARISON:  10/13/2016 FINDINGS: Brain: There is atrophy and chronic  small vessel disease changes. Old left basal ganglia lacunar infarct, stable. No acute intracranial abnormality. Specifically, no hemorrhage, hydrocephalus, mass lesion, acute infarction, or significant intracranial injury. Vascular: No hyperdense vessel or unexpected calcification. Skull: No acute calvarial abnormality. Sinuses/Orbits: Complete opacification of the left sphenoid sinus and posterior left ethmoid air cells. Mucosal thickening throughout the remainder of the ethmoid air cells. No air-fluid levels. Other: None IMPRESSION: No acute intracranial abnormality. Atrophy, chronic microvascular disease. Old left basal ganglia lacunar infarct. Sphenoid and ethmoid sinusitis, likely chronic. Electronically Signed   By: Charlett Nose M.D.   On: 02/08/2018 19:06    Procedures Procedures (including critical care time)  Medications Ordered in ED Medications  fentaNYL (SUBLIMAZE) injection 50 mcg (has no administration in time range)  tetracaine (PONTOCAINE) 0.5 % ophthalmic solution 2 drop (has no administration in time range)  acetaminophen (TYLENOL) tablet 1,000 mg (1,000 mg Oral Given 02/08/18 1846)  labetalol (NORMODYNE,TRANDATE) injection 10 mg (10 mg Intravenous Given 02/08/18 1900)  diphenhydrAMINE (BENADRYL) injection 25 mg (25 mg Intravenous Given 02/08/18 1957)  metoCLOPramide (REGLAN) injection 5 mg (5 mg Intravenous Given 02/08/18 1958)     Initial Impression / Assessment and Plan / ED Course  I have reviewed the triage vital signs and the nursing notes.  Pertinent labs & imaging results that were available during my  care of the patient were reviewed by me and considered in my medical decision making (see chart for details).    Patient presents with elevated blood pressure and worsening headache for almost 2 weeks.  With age, stroke history plan for CT scan of the head to ensure no occult hemorrhagic cause of his symptoms in addition we will check basic blood work, sed rate with mild left frontal and temporal tenderness. Blood pressure improved with pain medicines however still significantly elevated.  Labetalol given and improved blood pressure further. Patient had normal intraocular pressure in the left eye under 20. Patient sed rate normal.  Patient's blood pressure improved and symptoms improved in the ER.  No new neurologic deficits.  Patient well-appearing on recheck and comfortable with close outpatient follow-up.  Blood work reassuring and CT scan no acute findings.   Final Clinical Impressions(s) / ED Diagnoses   Final diagnoses:  Acute intractable tension-type headache  Essential hypertension    ED Discharge Orders    None       Blane Ohara, MD 02/08/18 2103

## 2018-02-12 ENCOUNTER — Encounter (HOSPITAL_COMMUNITY): Payer: Self-pay | Admitting: Emergency Medicine

## 2018-02-12 ENCOUNTER — Emergency Department (HOSPITAL_COMMUNITY)
Admission: EM | Admit: 2018-02-12 | Discharge: 2018-02-12 | Disposition: A | Discharge status: Home / Self Care | Payer: Medicare Other | Attending: Emergency Medicine | Admitting: Emergency Medicine

## 2018-02-12 ENCOUNTER — Other Ambulatory Visit: Payer: Self-pay

## 2018-02-12 ENCOUNTER — Emergency Department (HOSPITAL_COMMUNITY): Payer: Medicare Other

## 2018-02-12 DIAGNOSIS — I252 Old myocardial infarction: Secondary | ICD-10-CM | POA: Insufficient documentation

## 2018-02-12 DIAGNOSIS — E119 Type 2 diabetes mellitus without complications: Secondary | ICD-10-CM | POA: Diagnosis not present

## 2018-02-12 DIAGNOSIS — R51 Headache: Secondary | ICD-10-CM | POA: Insufficient documentation

## 2018-02-12 DIAGNOSIS — I2510 Atherosclerotic heart disease of native coronary artery without angina pectoris: Secondary | ICD-10-CM | POA: Diagnosis not present

## 2018-02-12 DIAGNOSIS — R519 Headache, unspecified: Secondary | ICD-10-CM

## 2018-02-12 LAB — CBC
HCT: 40.4 % (ref 39.0–52.0)
Hemoglobin: 13 g/dL (ref 13.0–17.0)
MCH: 30.6 pg (ref 26.0–34.0)
MCHC: 32.2 g/dL (ref 30.0–36.0)
MCV: 95.1 fL (ref 80.0–100.0)
Platelets: 210 10*3/uL (ref 150–400)
RBC: 4.25 MIL/uL (ref 4.22–5.81)
RDW: 12.1 % (ref 11.5–15.5)
WBC: 8.3 10*3/uL (ref 4.0–10.5)
nRBC: 0 % (ref 0.0–0.2)

## 2018-02-12 LAB — COMPREHENSIVE METABOLIC PANEL
ALT: 16 U/L (ref 0–44)
AST: 14 U/L — ABNORMAL LOW (ref 15–41)
Albumin: 3.5 g/dL (ref 3.5–5.0)
Alkaline Phosphatase: 63 U/L (ref 38–126)
Anion gap: 13 (ref 5–15)
BUN: 23 mg/dL (ref 8–23)
CHLORIDE: 101 mmol/L (ref 98–111)
CO2: 24 mmol/L (ref 22–32)
CREATININE: 1.06 mg/dL (ref 0.61–1.24)
Calcium: 8.9 mg/dL (ref 8.9–10.3)
GFR calc Af Amer: >60 mL/min (ref >60)
GFR calc non Af Amer: >60 mL/min (ref >60)
Glucose, Bld: 246 mg/dL — ABNORMAL HIGH (ref 70–99)
Potassium: 4.5 mmol/L (ref 3.5–5.1)
Sodium: 138 mmol/L (ref 135–145)
Total Bilirubin: 0.8 mg/dL (ref 0.3–1.2)
Total Protein: 6.7 g/dL (ref 6.5–8.1)

## 2018-02-12 LAB — SEDIMENTATION RATE: Sed Rate: 50 mm/hr — ABNORMAL HIGH (ref 0–16)

## 2018-02-12 MED ORDER — SODIUM CHLORIDE 0.9 % IV BOLUS
1000.0000 mL | Freq: Once | INTRAVENOUS | Status: AC
  Administered 2018-02-12: 1000 mL via INTRAVENOUS

## 2018-02-12 MED ORDER — DIPHENHYDRAMINE HCL 50 MG/ML IJ SOLN
25.0000 mg | Freq: Once | INTRAMUSCULAR | Status: AC
  Administered 2018-02-12: 25 mg via INTRAVENOUS
  Filled 2018-02-12: qty 1

## 2018-02-12 MED ORDER — PREDNISONE 20 MG PO TABS
60.0000 mg | ORAL_TABLET | Freq: Once | ORAL | Status: AC
  Administered 2018-02-12: 60 mg via ORAL
  Filled 2018-02-12: qty 3

## 2018-02-12 MED ORDER — PROCHLORPERAZINE EDISYLATE 10 MG/2ML IJ SOLN
10.0000 mg | Freq: Once | INTRAMUSCULAR | Status: AC
  Administered 2018-02-12: 10 mg via INTRAVENOUS
  Filled 2018-02-12: qty 2

## 2018-02-12 MED ORDER — FENTANYL CITRATE (PF) 100 MCG/2ML IJ SOLN
25.0000 ug | Freq: Once | INTRAMUSCULAR | Status: AC
  Administered 2018-02-12: 25 ug via INTRAVENOUS
  Filled 2018-02-12: qty 2

## 2018-02-12 MED ORDER — PREDNISONE 20 MG PO TABS: 60.0000 mg | ORAL_TABLET | Freq: Every day | ORAL | 1 refills | Status: DC

## 2018-02-12 NOTE — ED Notes (Signed)
Patient verbalizes understanding of discharge instructions. Opportunity for questioning and answers were provided. Armband removed by staff, pt discharged from ED.  

## 2018-02-12 NOTE — ED Notes (Signed)
Pt transported to MRI 

## 2018-02-12 NOTE — ED Triage Notes (Signed)
Onset 2 weeks ago developed a headache seen in the ED 4 days ago and follow up with his doctor today. Sent to the ED for evaluation and a MRI. 10/10 throbbing headache. Alert answering and following commands appropriate.

## 2018-02-12 NOTE — ED Notes (Signed)
Patient transported to MRI 

## 2018-02-12 NOTE — Discharge Instructions (Addendum)
You were evaluated in the Emergency Department and after careful evaluation, we did not find any emergent condition requiring admission or further testing in the hospital.  Your symptoms today are concerning for possible temporal arteritis.  Please take the steroid medication as instructed every day and follow-up as soon as you can with Dr. Genia DelMincey of ophthalmology.  If you experience any changes to her vision or vision loss it is very important that you return to the emergency department.  Please return to the Emergency Department if you experience any worsening of your condition.  We encourage you to follow up with a primary care provider.  Thank you for allowing us to be a part of your care.

## 2018-02-12 NOTE — ED Notes (Signed)
Wife arrived at bedside both patient and wife stated he has a history of 2 strokes in the past. Now has history of left sides weakness from the stroke no changes at this time.

## 2018-02-12 NOTE — ED Provider Notes (Signed)
[facility] Emergency Department Provider Note MRN:  [id number]  Arrival date & time: 02/12/18     Chief Complaint   Headache   History of Present Illness   Grant Sparks is a 72 y.o. year-old male with a history of CAD, stroke presenting to the ED with chief complaint of headache.  2 weeks of constant headache, located in the occipital scalp, radiating to the left periorbital region.  Described as throbbing, severe.  Has been evaluated in the ED with negative CT imaging.  Followed up with PCP, was prescribed muscle relaxers, no help.  Sent here by PCP for MRI.  Patient denies vision change, no nausea, no vomiting.  Headache is not made worse by bright lights or loud noises.  Patient denies chest pain or shortness of breath, no abdominal pain, no new numbness or weakness in the arms or legs.  Patient has prior history of stroke that requires him to wear a brace on his left leg due to instability.  Review of Systems  A complete 10 system review of systems was obtained and all systems are negative except as noted in the HPI and PMH.   Patient's Health History    Past Medical History:  Diagnosis Date  . CAD (coronary artery disease)    S/P stenting of the RCA x2 and PTCA of a PDA lesion in Jan 2004. Pt had a myoracidal infarction when he was in rehabilitation following a CVA.  [name] CVA (cerebral vascular accident) ([place])    Right  CVA, left hemiparesis  . Diabetes mellitus without complication ([place])   . MI (myocardial infarction) [facility])     Past Surgical History:  Procedure Laterality Date  . APPENDECTOMY    . CATARACT EXTRACTION    . CERVICAL SPINE SURGERY    . coronary artery stent placement     2004    Family History  Problem Relation Age of Onset  . Heart attack Father 37       MI  . Diabetes Father   . Heart attack Brother 21       MI  (might have been a PE)  . Diabetes Brother   . Heart attack Mother 30  . Diabetes Mother   . Diabetes Sister     Social  History   Socioeconomic History  . Marital status: Married    Spouse name: Not on file  . Number of children: Not on file  . Years of education: Not on file  . Highest education level: Not on file  Occupational History  . Occupation: Engineer, maintenance (IT)  Social Needs  . Financial resource strain: Not on file  . Food insecurity:    Worry: Not on file    Inability: Not on file  . Transportation needs:    Medical: Not on file    Non-medical: Not on file  Tobacco Use  . Smoking status: Never Smoker  . Smokeless tobacco: Never Used  Substance and Sexual Activity  . Alcohol use: No  . Drug use: No  . Sexual activity: Not on file  Lifestyle  . Physical activity:    Days per week: Not on file    Minutes per session: Not on file  . Stress: Not on file  Relationships  . Social connections:    Talks on phone: Not on file    Gets together: Not on file    Attends religious service: Not on file    Active member of club or organization: Not on  file    Attends meetings of clubs or organizations: Not on file    Relationship status: Not on file  . Intimate partner violence:    Fear of current or ex partner: Not on file    Emotionally abused: Not on file    Physically abused: Not on file    Forced sexual activity: Not on file  Other Topics Concern  . Not on file  Social History Narrative   Lives in [place]   Has been married over 30 years   2 adult children and 5 grandchildren     Physical Exam  Vital Signs and Nursing Notes reviewed Vitals:   02/12/18 1500 02/12/18 1515  BP: (!) 148/72 (!) 156/73  Pulse: 69 67  Resp: 18 16  Temp:    SpO2: 97% 97%    CONSTITUTIONAL: Well-appearing, NAD NEURO:  Alert and oriented x 3, no focal deficits EYES:  eyes equal and reactive ENT/NECK:  no LAD, no JVD CARDIO: Regular rate, well-perfused, normal S1 and S2 PULM:  CTAB no wheezing or rhonchi GI/GU:  normal bowel sounds, non-distended, non-tender MSK/SPINE:  No gross deformities, no edema SKIN:  no  rash, atraumatic PSYCH:  Appropriate speech and behavior  Diagnostic and Interventional Summary    EKG Interpretation  Date/Time:    Ventricular Rate:    PR Interval:    QRS Duration:   QT Interval:    QTC Calculation:   R Axis:     Text Interpretation:        Labs Reviewed  COMPREHENSIVE METABOLIC PANEL - Abnormal; Notable for the following components:      Result Value   Glucose, Bld 246 (*)    AST 14 (*)    All other components within normal limits  SEDIMENTATION RATE - Abnormal; Notable for the following components:   Sed Rate 50 (*)    All other components within normal limits  CBC    MR BRAIN WO CONTRAST  Final Result      Medications  predniSONE (DELTASONE) tablet 60 mg (has no administration in time range)  prochlorperazine (COMPAZINE) injection 10 mg (10 mg Intravenous Given 02/12/18 1141)  diphenhydrAMINE (BENADRYL) injection 25 mg (25 mg Intravenous Given 02/12/18 1141)  sodium chloride 0.9 % bolus 1,000 mL (0 mLs Intravenous Stopped 02/12/18 1246)  fentaNYL (SUBLIMAZE) injection 25 mcg (25 mcg Intravenous Given 02/12/18 1500)     Procedures Critical Care  ED Course and Medical Decision Making  I have reviewed the triage vital signs and the nursing notes.  Pertinent labs & imaging results that were available during my care of the patient were reviewed by me and considered in my medical decision making (see below for details).  Unclear etiology of patient's headache, will pursue MRI imaging given his past medical history, age, concerning that this is his first headache in 30 years.  Will recheck labs, ESR.  MRI unremarkable.  Labs reveal elevated ESR at 50.  Patient has no vision loss.  Discussed case with Dr. 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  Dr. Alanda Slim will see the patient in clinic very soon to set up possible temporal artery biopsy.  Barth Kirks. Sedonia Small,  [place] [email or web address]  Final Clinical Impressions(s) / ED Diagnoses     ICD-10-CM   1. Acute intractable headache, unspecified headache type R51     ED Discharge Orders  Ordered    predniSONE (DELTASONE) 20 MG tablet  Daily     02/12/18 1531             Maudie Flakes, MD 02/12/18 1534

## 2018-02-25 ENCOUNTER — Emergency Department (HOSPITAL_COMMUNITY)
Admission: EM | Admit: 2018-02-25 | Discharge: 2018-02-25 | Disposition: A | Discharge status: Home / Self Care | Payer: Medicare Other | Attending: Emergency Medicine | Admitting: Emergency Medicine

## 2018-02-25 ENCOUNTER — Emergency Department (HOSPITAL_COMMUNITY): Payer: Medicare Other

## 2018-02-25 ENCOUNTER — Encounter (HOSPITAL_COMMUNITY): Payer: Self-pay | Admitting: Emergency Medicine

## 2018-02-25 ENCOUNTER — Other Ambulatory Visit: Payer: Self-pay

## 2018-02-25 DIAGNOSIS — J323 Chronic sphenoidal sinusitis: Secondary | ICD-10-CM | POA: Insufficient documentation

## 2018-02-25 DIAGNOSIS — R531 Weakness: Secondary | ICD-10-CM | POA: Insufficient documentation

## 2018-02-25 DIAGNOSIS — G43811 Other migraine, intractable, with status migrainosus: Secondary | ICD-10-CM

## 2018-02-25 DIAGNOSIS — R51 Headache: Secondary | ICD-10-CM | POA: Diagnosis present

## 2018-02-25 LAB — BASIC METABOLIC PANEL
Anion gap: 12 (ref 5–15)
BUN: 28 mg/dL — ABNORMAL HIGH (ref 8–23)
CHLORIDE: 99 mmol/L (ref 98–111)
CO2: 22 mmol/L (ref 22–32)
Calcium: 9.1 mg/dL (ref 8.9–10.3)
Creatinine, Ser: 0.99 mg/dL (ref 0.61–1.24)
GFR calc Af Amer: >60 mL/min (ref >60)
GFR calc non Af Amer: >60 mL/min (ref >60)
Glucose, Bld: 356 mg/dL — ABNORMAL HIGH (ref 70–99)
Potassium: 4.4 mmol/L (ref 3.5–5.1)
Sodium: 133 mmol/L — ABNORMAL LOW (ref 135–145)

## 2018-02-25 LAB — CBC
HCT: 42.1 % (ref 39.0–52.0)
Hemoglobin: 13.5 g/dL (ref 13.0–17.0)
MCH: 30.1 pg (ref 26.0–34.0)
MCHC: 32.1 g/dL (ref 30.0–36.0)
MCV: 94 fL (ref 80.0–100.0)
NRBC: 0 % (ref 0.0–0.2)
Platelets: 293 10*3/uL (ref 150–400)
RBC: 4.48 MIL/uL (ref 4.22–5.81)
RDW: 11.9 % (ref 11.5–15.5)
WBC: 15.9 10*3/uL — ABNORMAL HIGH (ref 4.0–10.5)

## 2018-02-25 LAB — CBG MONITORING, ED: Glucose-Capillary: 358 mg/dL — ABNORMAL HIGH (ref 70–99)

## 2018-02-25 MED ORDER — DIPHENHYDRAMINE HCL 50 MG/ML IJ SOLN
25.0000 mg | Freq: Once | INTRAMUSCULAR | Status: AC
  Administered 2018-02-25: 25 mg via INTRAVENOUS
  Filled 2018-02-25: qty 1

## 2018-02-25 MED ORDER — MAGNESIUM SULFATE 2 GM/50ML IV SOLN
2.0000 g | Freq: Once | INTRAVENOUS | Status: AC
  Administered 2018-02-25: 2 g via INTRAVENOUS
  Filled 2018-02-25: qty 50

## 2018-02-25 MED ORDER — GADOBUTROL 1 MMOL/ML IV SOLN
6.0000 mL | Freq: Once | INTRAVENOUS | Status: AC | PRN
  Administered 2018-02-25: 6 mL via INTRAVENOUS

## 2018-02-25 MED ORDER — PROCHLORPERAZINE EDISYLATE 10 MG/2ML IJ SOLN
10.0000 mg | Freq: Once | INTRAMUSCULAR | Status: AC
  Administered 2018-02-25: 10 mg via INTRAVENOUS
  Filled 2018-02-25: qty 2

## 2018-02-25 MED ORDER — SODIUM CHLORIDE 0.9 % IV BOLUS
1000.0000 mL | Freq: Once | INTRAVENOUS | Status: AC
  Administered 2018-02-25: 1000 mL via INTRAVENOUS

## 2018-02-25 MED ORDER — AMOXICILLIN-POT CLAVULANATE 875-125 MG PO TABS: 1.0000 | ORAL_TABLET | Freq: Two times a day (BID) | ORAL | 0 refills | Status: DC

## 2018-02-25 NOTE — Discharge Instructions (Signed)
STOP TAKING IBUPROFEN, ALEVE, MOTRIN, OR ADVIL AND TAKE TYLENOL ONLY FOR HEADACHES. DRINK PLENTY OF FLUIDS.  HAVE YOUR VISION CHECKED IF NOT DONE IN THE LAST 6 MONTHS.

## 2018-02-25 NOTE — ED Notes (Signed)
Pt continues to c/o headache, rates a #8 on pain scale 0/10.  Pt sleeping but will wake up to rate pain level

## 2018-02-25 NOTE — ED Notes (Signed)
Pt aware of need for urine sample, urinal at bedside 

## 2018-02-25 NOTE — ED Triage Notes (Signed)
Wife stated, Grant Sparks had a headache with weakness for the last 3 weeks. Pt has been here 3 other times. Had a CT scan and it was normal

## 2018-02-25 NOTE — ED Provider Notes (Signed)
[facility] EMERGENCY DEPARTMENT Provider Note   CSN: [id number] Arrival date & time: 02/25/18  1231     History   Chief Complaint Chief Complaint  Patient presents with  . Headache  . Weakness    HPI Grant Sparks is a 72 y.o. male.  73yo M w/ PMH including CAD, CVA w/ L sided weakness, T2DM who p/w headache.  The patient has had persistent headaches for the past 1 month.  He presented to the ER on 12/14 after the headaches had been going on for 2 weeks and his work-up was reassuring.  He presented again on 12/18 with no improvement and had an MRI that was negative.  He had an ESR that was elevated and was started on prednisone.  He recently had a left temporal artery biopsy and was informed that the results were normal so he is being tapered off of the prednisone.  He reports that his headache is constant and severe.  It seems to be worse in the evenings but is present throughout the day.  The pain starts in the left back of his head and wraps around to the front of his left head.  He denies any associated vision changes, new extremity weakness or numbness, vomiting, chest pain, breathing problems, URI symptoms, fevers, or abdominal pain.  He frequently takes NSAIDs to try to help but with no relief.  He has noticed some problems with choking with swallowing water that was present previously but has been worse over the past month with the headaches.  He has no problems with food.  The history is provided by the patient and the spouse.  Headache    Weakness  Associated symptoms include headaches.    Past Medical History:  Diagnosis Date  . CAD (coronary artery disease)    S/P stenting of the RCA x2 and PTCA of a PDA lesion in Jan 2004. Pt had a myoracidal infarction when he was in rehabilitation following a CVA.  [name] CVA (cerebral vascular accident) ([place])    Right  CVA, left hemiparesis  . Diabetes mellitus without complication ([place])   . MI (myocardial  infarction) [facility])     Patient Active Problem List   Diagnosis Date Noted  . Cerebral thrombosis with cerebral infarction 10/14/2016  . Normocytic anemia 10/14/2016  . Facial droop 10/13/2016  . Benign essential hypertension 10/13/2016  . 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 CVA (cerebral vascular accident) ([place])   . CAD 03/01/2008  . CORONARY ARTERY DISEASE, S/P PTCA 03/01/2008  . Cerebral artery occlusion with cerebral infarction ([place]) 03/01/2008    Past Surgical History:  Procedure Laterality Date  . APPENDECTOMY    . CATARACT EXTRACTION    . CERVICAL SPINE SURGERY    . coronary artery stent placement     2004        Home Medications    Prior to Admission medications   Medication Sig Start Date End Date Taking? Authorizing Provider  amLODipine (NORVASC) 5 MG tablet Take 5 mg by mouth daily. 02/10/18 02/10/19 Yes [provider]  aspirin EC 325 MG tablet Take 1 tablet (325 mg total) by mouth daily. Patient taking differently: Take 162 mg by mouth 2 (two) times daily.  12/03/16  Yes Garvin Fila, MD  atorvastatin (LIPITOR) 40 MG tablet Take 40 mg by mouth daily.  12/09/17  Yes [provider]  glipiZIDE (GLUCOTROL  XL) 5 MG 24 hr tablet Take 5 mg by mouth every morning.  07/04/15  Yes [provider]  hydrochlorothiazide (MICROZIDE) 12.5 MG capsule Take 12.5 mg by mouth daily.   Yes [provider]  insulin detemir (LEVEMIR FLEXPEN) 100 UNIT/ML injection Inject 30 Units into the skin 2 (two) times daily.    Yes [provider]  metFORMIN (GLUCOPHAGE) 1000 MG tablet Take 1,000 mg by mouth 2 (two) times daily.    Yes [provider]  Methylcellulose, Laxative, (CITRUCEL) 500 MG TABS Take 500 mg by mouth at bedtime.   Yes [provider]  metoprolol (LOPRESSOR) 50 MG tablet Take 50 mg by mouth 2 (two) times daily.     Yes  [provider]  naproxen sodium (ALEVE) 220 MG tablet Take 440 mg by mouth as needed (pain).   Yes [provider]  nitroGLYCERIN (NITROSTAT) 0.4 MG SL tablet Place 1 tablet (0.4 mg total) under the tongue as needed. 07/19/15  Yes Minus Breeding, MD  OVER THE COUNTER MEDICATION Take 1 tablet by mouth at bedtime. [facility] 4-in 1   Yes [provider]  predniSONE (DELTASONE) 20 MG tablet Take 3 tablets (60 mg total) by mouth daily. Patient taking differently: Take 40 mg by mouth daily. 40 mg for three days  20 mg for three days 10 mg for three days 02/12/18  Yes Maudie Flakes, MD  ramipril (ALTACE) 5 MG capsule Take 5 mg by mouth daily.   Yes [provider]  amoxicillin-clavulanate (AUGMENTIN) 875-125 MG tablet Take 1 tablet by mouth every 12 (twelve) hours. 02/25/18   Grant Sparks, [name], MD    Family History Family History  Problem Relation Age of Onset  . Heart attack Father 19       MI  . Diabetes Father   . Heart attack Brother 21       MI  (might have been a PE)  . Diabetes Brother   . Heart attack Mother 17  . Diabetes Mother   . Diabetes Sister     Social History Social History   Tobacco Use  . Smoking status: Never Smoker  . Smokeless tobacco: Never Used  Substance Use Topics  . Alcohol use: No  . Drug use: No     Allergies   Ambien [zolpidem] and Tadalafil   Review of Systems Review of Systems  Neurological: Positive for weakness and headaches.   All other systems reviewed and are negative except that which was mentioned in HPI   Physical Exam Updated Vital Signs BP (!) 146/67 (BP Location: Right Arm)   Pulse (!) 16   Temp 98 F (36.7 C) (Oral)   Resp (!) 6   SpO2 96%   Physical Exam Vitals signs and nursing note reviewed.  Constitutional:      General: He is not in acute distress.    Appearance: He is well-developed.     Comments: Awake, alert  HENT:     Head: Normocephalic and atraumatic.  Eyes:       Extraocular Movements: Extraocular movements intact.     Conjunctiva/sclera: Conjunctivae normal.     Pupils: Pupils are equal, round, and reactive to light.  Neck:     Musculoskeletal: Neck supple.  Cardiovascular:     Rate and Rhythm: Normal rate and regular rhythm.     Heart sounds: Normal heart sounds. No murmur.  Pulmonary:     Effort: Pulmonary effort is normal. No respiratory distress.  Breath sounds: Normal breath sounds.  Abdominal:     General: Bowel sounds are normal. There is no distension.     Palpations: Abdomen is soft.     Tenderness: There is no abdominal tenderness.  Skin:    General: Skin is warm and dry.     Comments: Healing incision site on left temple with no erythema, drainage, or tenderness  Neurological:     Mental Status: He is alert and oriented to person, place, and time.     Motor: No abnormal muscle tone.     Deep Tendon Reflexes: Reflexes are normal and symmetric.     Comments: Fluent speech, unsteady gait requiring 1 person assistance Mild R facial droop at mouth, remainder of CN intact 5/5 strength LUE, RUE, RLE; 4/5 strength LLE w/ foot drop and brace in place normal sensation x all 4 extremities  Psychiatric:        Mood and Affect: Mood normal.        Thought Content: Thought content normal.        Judgment: Judgment normal.      ED Treatments / Results  Labs (all labs ordered are listed, but only abnormal results are displayed) Labs Reviewed  BASIC METABOLIC PANEL - Abnormal; Notable for the following components:      Result Value   Sodium 133 (*)    Glucose, Bld 356 (*)    BUN 28 (*)    All other components within normal limits  CBC - Abnormal; Notable for the following components:   WBC 15.9 (*)    All other components within normal limits  CBG MONITORING, ED - Abnormal; Notable for the following components:   Glucose-Capillary 358 (*)    All other components within normal limits  URINALYSIS, ROUTINE W REFLEX  MICROSCOPIC    EKG EKG Interpretation  Date/Time:  Tuesday February 25 2018 12:57:32 EST Ventricular Rate:  72 PR Interval:  118 QRS Duration: 134 QT Interval:  416 QTC Calculation: 455 R Axis:   -138 Text Interpretation:  Normal sinus rhythm Right bundle branch block Septal infarct , age undetermined Abnormal ECG similar to previous Confirmed by Theotis Burrow [phone]) on 02/25/2018 3:07:43 PM   Radiology Mr Jodene Nam Head Wo Contrast  Result Date: 02/25/2018 CLINICAL DATA:  72 y/o M; worst headache of life and generalized weakness. EXAM: MRI HEAD WITH CONTRAST MRA HEAD WITHOUT CONTRAST MRA NECK WITHOUT AND WITH CONTRAST MRV HEAD WITHOUT CONTRAST TECHNIQUE: Axial DWI, coronal DWI, axial T2 FLAIR, axial T1 postcontrast, and coronal T1 postcontrast sequences were acquired of the head. Angiographic images of the Circle of Willis were obtained using MRA technique without intravenous contrast. Angiographic images of the neck were obtained using MRA technique without and with intravenous contrast. Carotid stenosis measurements (when applicable) are obtained utilizing NASCET criteria, using the distal internal carotid diameter as the denominator. Venographic images of the head were obtained using MRV technique without intravenous contrast. CONTRAST:  6 cc Gadavist COMPARISON:  02/12/2018 MRI of the head. 10/14/2016 CT angiogram of head and neck. FINDINGS: MRI HEAD FINDINGS Brain: No reduced diffusion to suggest acute or early subacute infarction. Stable nonspecific T2 FLAIR hyperintensities in subcortical and periventricular white matter are compatible with mild to moderate chronic microvascular ischemic changes for age. Stable moderate volume loss of the brain. Asymmetric prominent volume loss of cerebellum and pons. Stable small chronic infarcts are present within the pons, left genu of internal capsule, as well as the left lentiform nucleus. After administration of intravenous  contrast there is no abnormal  enhancement of the brain. Vascular: As below. Skull and upper cervical spine: Normal marrow signal. Sinuses/Orbits: Sphenoid sinus opacification with rounded expansile low signal focus possibly representing inspissation or fungal ball. Trace right mastoid opacification. Bilateral intra-ocular lens replacement. Other: None. MRA HEAD FINDINGS Internal carotid arteries: Patent. Irregularity of the bilateral carotid siphons compatible with atherosclerotic disease. Mild bilateral paraclinoid ICA stenosis. Anterior cerebral arteries:  Patent. Middle cerebral arteries: Patent. Anterior communicating artery: Patent. Posterior communicating arteries: Not identified, likely hypoplastic or absent Posterior cerebral arteries:  Patent.  Moderate left P2 stenosis. Basilar artery:  Patent. Vertebral arteries: Patent. Left dominant vertebrobasilar system. Diminutive right V4 between the PICA origin in the vertebrobasilar junction. No additional evidence of high-grade stenosis, large vessel occlusion, or aneurysm unless noted above. MRA NECK FINDINGS Aortic arch: Patent.  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 Right common carotid artery: Patent. Right internal carotid artery: Patent. Minimal approximately 30% proximal right ICA stenosis. Right vertebral artery: Patent. Mild to moderate 50% right vertebral artery origin stenosis. Left common carotid artery: Patent. Left Internal carotid artery: Patent. Mild-to-moderate 50% proximal left ICA stenosis. Left Vertebral artery: Patent.  Left dominant. There is no additional evidence of hemodynamically significant stenosis by NASCET criteria, occlusion, or aneurysm unless noted above. MRV NECK FINDINGS Patent superior sagittal sinus, straight sinus, internal cerebral veins, basal veins of Rosenthal, bilateral transverse sinus, bilateral sigmoid sinus, and bilateral internal jugular veins. Patent large cortical veins. Right dominant transverse dural venous sinus drainage system,  normal variant. IMPRESSION: MRI head: 1. No acute intracranial process or abnormal enhancement of the brain. 2. Stable chronic microvascular ischemic changes, volume loss of the brain, and chronic pontine/left basal ganglia infarcts. 3. Chronic expansile sphenoid sinus disease with low signal contents which may represent inspissation or fungal ball. MRA head: 1. No large vessel occlusion or aneurysm. 2. Stable intracranial atherosclerosis with mild bilateral paraclinoid ICA and moderate left P2 stenosis. MRA neck: 1. 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 2. Stable right proximal ICA minimal approximately 30% stenosis. 3. Stable left proximal ICA mild-to-moderate 50% stenosis. 4. Stable right vertebral origin mild to moderate 50% stenosis. 5. No evidence for dissection, aneurysm, or additional segment of hemodynamically significant stenosis by NASCET criteria. MRV head: No evidence of dural venous sinus thrombosis. Electronically Signed   By: Kristine Garbe M.D.   On: 02/25/2018 19:02   Mr Jodene Nam Neck W Contrast  Result Date: 02/25/2018 CLINICAL DATA:  72 y/o M; worst headache of life and generalized weakness. EXAM: MRI HEAD WITH CONTRAST MRA HEAD WITHOUT CONTRAST MRA NECK WITHOUT AND WITH CONTRAST MRV HEAD WITHOUT CONTRAST TECHNIQUE: Axial DWI, coronal DWI, axial T2 FLAIR, axial T1 postcontrast, and coronal T1 postcontrast sequences were acquired of the head. Angiographic images of the Circle of Willis were obtained using MRA technique without intravenous contrast. Angiographic images of the neck were obtained using MRA technique without and with intravenous contrast. Carotid stenosis measurements (when applicable) are obtained utilizing NASCET criteria, using the distal internal carotid diameter as the denominator. Venographic images of the head were obtained using MRV technique without intravenous contrast. CONTRAST:  6 cc Gadavist COMPARISON:  02/12/2018 MRI of the head. 10/14/2016 CT angiogram  of head and neck. FINDINGS: MRI HEAD FINDINGS Brain: No reduced diffusion to suggest acute or early subacute infarction. Stable nonspecific T2 FLAIR hyperintensities in subcortical and periventricular white matter are compatible with mild to moderate chronic microvascular ischemic changes for age. Stable moderate volume loss of the brain.  Asymmetric prominent volume loss of cerebellum and pons. Stable small chronic infarcts are present within the pons, left genu of internal capsule, as well as the left lentiform nucleus. After administration of intravenous contrast there is no abnormal enhancement of the brain. Vascular: As below. Skull and upper cervical spine: Normal marrow signal. Sinuses/Orbits: Sphenoid sinus opacification with rounded expansile low signal focus possibly representing inspissation or fungal ball. Trace right mastoid opacification. Bilateral intra-ocular lens replacement. Other: None. MRA HEAD FINDINGS Internal carotid arteries: Patent. Irregularity of the bilateral carotid siphons compatible with atherosclerotic disease. Mild bilateral paraclinoid ICA stenosis. Anterior cerebral arteries:  Patent. Middle cerebral arteries: Patent. Anterior communicating artery: Patent. Posterior communicating arteries: Not identified, likely hypoplastic or absent Posterior cerebral arteries:  Patent.  Moderate left P2 stenosis. Basilar artery:  Patent. Vertebral arteries: Patent. Left dominant vertebrobasilar system. Diminutive right V4 between the PICA origin in the vertebrobasilar junction. No additional evidence of high-grade stenosis, large vessel occlusion, or aneurysm unless noted above. MRA NECK FINDINGS Aortic arch: Patent.  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 Right common carotid artery: Patent. Right internal carotid artery: Patent. Minimal approximately 30% proximal right ICA stenosis. Right vertebral artery: Patent. Mild to moderate 50% right vertebral artery origin stenosis. Left common  carotid artery: Patent. Left Internal carotid artery: Patent. Mild-to-moderate 50% proximal left ICA stenosis. Left Vertebral artery: Patent.  Left dominant. There is no additional evidence of hemodynamically significant stenosis by NASCET criteria, occlusion, or aneurysm unless noted above. MRV NECK FINDINGS Patent superior sagittal sinus, straight sinus, internal cerebral veins, basal veins of Rosenthal, bilateral transverse sinus, bilateral sigmoid sinus, and bilateral internal jugular veins. Patent large cortical veins. Right dominant transverse dural venous sinus drainage system, normal variant. IMPRESSION: MRI head: 1. No acute intracranial process or abnormal enhancement of the brain. 2. Stable chronic microvascular ischemic changes, volume loss of the brain, and chronic pontine/left basal ganglia infarcts. 3. Chronic expansile sphenoid sinus disease with low signal contents which may represent inspissation or fungal ball. MRA head: 1. No large vessel occlusion or aneurysm. 2. Stable intracranial atherosclerosis with mild bilateral paraclinoid ICA and moderate left P2 stenosis. MRA neck: 1. 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 2. Stable right proximal ICA minimal approximately 30% stenosis. 3. Stable left proximal ICA mild-to-moderate 50% stenosis. 4. Stable right vertebral origin mild to moderate 50% stenosis. 5. No evidence for dissection, aneurysm, or additional segment of hemodynamically significant stenosis by NASCET criteria. MRV head: No evidence of dural venous sinus thrombosis. Electronically Signed   By: Kristine Garbe M.D.   On: 02/25/2018 19:02   Mr Brain W Contrast  Result Date: 02/25/2018 CLINICAL DATA:  72 y/o M; worst headache of life and generalized weakness. EXAM: MRI HEAD WITH CONTRAST MRA HEAD WITHOUT CONTRAST MRA NECK WITHOUT AND WITH CONTRAST MRV HEAD WITHOUT CONTRAST TECHNIQUE: Axial DWI, coronal DWI, axial T2 FLAIR, axial T1 postcontrast, and coronal T1  postcontrast sequences were acquired of the head. Angiographic images of the Circle of Willis were obtained using MRA technique without intravenous contrast. Angiographic images of the neck were obtained using MRA technique without and with intravenous contrast. Carotid stenosis measurements (when applicable) are obtained utilizing NASCET criteria, using the distal internal carotid diameter as the denominator. Venographic images of the head were obtained using MRV technique without intravenous contrast. CONTRAST:  6 cc Gadavist COMPARISON:  02/12/2018 MRI of the head. 10/14/2016 CT angiogram of head and neck. FINDINGS: MRI HEAD FINDINGS Brain: No reduced diffusion to suggest acute or early subacute  infarction. Stable nonspecific T2 FLAIR hyperintensities in subcortical and periventricular white matter are compatible with mild to moderate chronic microvascular ischemic changes for age. Stable moderate volume loss of the brain. Asymmetric prominent volume loss of cerebellum and pons. Stable small chronic infarcts are present within the pons, left genu of internal capsule, as well as the left lentiform nucleus. After administration of intravenous contrast there is no abnormal enhancement of the brain. Vascular: As below. Skull and upper cervical spine: Normal marrow signal. Sinuses/Orbits: Sphenoid sinus opacification with rounded expansile low signal focus possibly representing inspissation or fungal ball. Trace right mastoid opacification. Bilateral intra-ocular lens replacement. Other: None. MRA HEAD FINDINGS Internal carotid arteries: Patent. Irregularity of the bilateral carotid siphons compatible with atherosclerotic disease. Mild bilateral paraclinoid ICA stenosis. Anterior cerebral arteries:  Patent. Middle cerebral arteries: Patent. Anterior communicating artery: Patent. Posterior communicating arteries: Not identified, likely hypoplastic or absent Posterior cerebral arteries:  Patent.  Moderate left P2  stenosis. Basilar artery:  Patent. Vertebral arteries: Patent. Left dominant vertebrobasilar system. Diminutive right V4 between the PICA origin in the vertebrobasilar junction. No additional evidence of high-grade stenosis, large vessel occlusion, or aneurysm unless noted above. MRA NECK FINDINGS Aortic arch: Patent.  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 Right common carotid artery: Patent. Right internal carotid artery: Patent. Minimal approximately 30% proximal right ICA stenosis. Right vertebral artery: Patent. Mild to moderate 50% right vertebral artery origin stenosis. Left common carotid artery: Patent. Left Internal carotid artery: Patent. Mild-to-moderate 50% proximal left ICA stenosis. Left Vertebral artery: Patent.  Left dominant. There is no additional evidence of hemodynamically significant stenosis by NASCET criteria, occlusion, or aneurysm unless noted above. MRV NECK FINDINGS Patent superior sagittal sinus, straight sinus, internal cerebral veins, basal veins of Rosenthal, bilateral transverse sinus, bilateral sigmoid sinus, and bilateral internal jugular veins. Patent large cortical veins. Right dominant transverse dural venous sinus drainage system, normal variant. IMPRESSION: MRI head: 1. No acute intracranial process or abnormal enhancement of the brain. 2. Stable chronic microvascular ischemic changes, volume loss of the brain, and chronic pontine/left basal ganglia infarcts. 3. Chronic expansile sphenoid sinus disease with low signal contents which may represent inspissation or fungal ball. MRA head: 1. No large vessel occlusion or aneurysm. 2. Stable intracranial atherosclerosis with mild bilateral paraclinoid ICA and moderate left P2 stenosis. MRA neck: 1. 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 2. Stable right proximal ICA minimal approximately 30% stenosis. 3. Stable left proximal ICA mild-to-moderate 50% stenosis. 4. Stable right vertebral origin mild to moderate 50% stenosis.  5. No evidence for dissection, aneurysm, or additional segment of hemodynamically significant stenosis by NASCET criteria. MRV head: No evidence of dural venous sinus thrombosis. Electronically Signed   By: Kristine Garbe M.D.   On: 02/25/2018 19:02   Mr Mrv Head Wo Cm  Result Date: 02/25/2018 CLINICAL DATA:  72 y/o M; worst headache of life and generalized weakness. EXAM: MRI HEAD WITH CONTRAST MRA HEAD WITHOUT CONTRAST MRA NECK WITHOUT AND WITH CONTRAST MRV HEAD WITHOUT CONTRAST TECHNIQUE: Axial DWI, coronal DWI, axial T2 FLAIR, axial T1 postcontrast, and coronal T1 postcontrast sequences were acquired of the head. Angiographic images of the Circle of Willis were obtained using MRA technique without intravenous contrast. Angiographic images of the neck were obtained using MRA technique without and with intravenous contrast. Carotid stenosis measurements (when applicable) are obtained utilizing NASCET criteria, using the distal internal carotid diameter as the denominator. Venographic images of the head were obtained using MRV technique without intravenous contrast. CONTRAST:  6 cc Gadavist COMPARISON:  02/12/2018 MRI of the head. 10/14/2016 CT angiogram of head and neck. FINDINGS: MRI HEAD FINDINGS Brain: No reduced diffusion to suggest acute or early subacute infarction. Stable nonspecific T2 FLAIR hyperintensities in subcortical and periventricular white matter are compatible with mild to moderate chronic microvascular ischemic changes for age. Stable moderate volume loss of the brain. Asymmetric prominent volume loss of cerebellum and pons. Stable small chronic infarcts are present within the pons, left genu of internal capsule, as well as the left lentiform nucleus. After administration of intravenous contrast there is no abnormal enhancement of the brain. Vascular: As below. Skull and upper cervical spine: Normal marrow signal. Sinuses/Orbits: Sphenoid sinus opacification with rounded expansile  low signal focus possibly representing inspissation or fungal ball. Trace right mastoid opacification. Bilateral intra-ocular lens replacement. Other: None. MRA HEAD FINDINGS Internal carotid arteries: Patent. Irregularity of the bilateral carotid siphons compatible with atherosclerotic disease. Mild bilateral paraclinoid ICA stenosis. Anterior cerebral arteries:  Patent. Middle cerebral arteries: Patent. Anterior communicating artery: Patent. Posterior communicating arteries: Not identified, likely hypoplastic or absent Posterior cerebral arteries:  Patent.  Moderate left P2 stenosis. Basilar artery:  Patent. Vertebral arteries: Patent. Left dominant vertebrobasilar system. Diminutive right V4 between the PICA origin in the vertebrobasilar junction. No additional evidence of high-grade stenosis, large vessel occlusion, or aneurysm unless noted above. MRA NECK FINDINGS Aortic arch: Patent.  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 Right common carotid artery: Patent. Right internal carotid artery: Patent. Minimal approximately 30% proximal right ICA stenosis. Right vertebral artery: Patent. Mild to moderate 50% right vertebral artery origin stenosis. Left common carotid artery: Patent. Left Internal carotid artery: Patent. Mild-to-moderate 50% proximal left ICA stenosis. Left Vertebral artery: Patent.  Left dominant. There is no additional evidence of hemodynamically significant stenosis by NASCET criteria, occlusion, or aneurysm unless noted above. MRV NECK FINDINGS Patent superior sagittal sinus, straight sinus, internal cerebral veins, basal veins of Rosenthal, bilateral transverse sinus, bilateral sigmoid sinus, and bilateral internal jugular veins. Patent large cortical veins. Right dominant transverse dural venous sinus drainage system, normal variant. IMPRESSION: MRI head: 1. No acute intracranial process or abnormal enhancement of the brain. 2. Stable chronic microvascular ischemic changes, volume loss  of the brain, and chronic pontine/left basal ganglia infarcts. 3. Chronic expansile sphenoid sinus disease with low signal contents which may represent inspissation or fungal ball. MRA head: 1. No large vessel occlusion or aneurysm. 2. Stable intracranial atherosclerosis with mild bilateral paraclinoid ICA and moderate left P2 stenosis. MRA neck: 1. 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 2. Stable right proximal ICA minimal approximately 30% stenosis. 3. Stable left proximal ICA mild-to-moderate 50% stenosis. 4. Stable right vertebral origin mild to moderate 50% stenosis. 5. No evidence for dissection, aneurysm, or additional segment of hemodynamically significant stenosis by NASCET criteria. MRV head: No evidence of dural venous sinus thrombosis. Electronically Signed   By: Kristine Garbe M.D.   On: 02/25/2018 19:02    Procedures Procedures (including critical care time)  Medications Ordered in ED Medications  sodium chloride 0.9 % bolus 1,000 mL (0 mLs Intravenous Stopped 02/25/18 1856)  magnesium sulfate IVPB 2 g 50 mL (0 g Intravenous Stopped 02/25/18 1856)  diphenhydrAMINE (BENADRYL) injection 25 mg (25 mg Intravenous Given 02/25/18 1626)  prochlorperazine (COMPAZINE) injection 10 mg (10 mg Intravenous Given 02/25/18 1624)  gadobutrol (GADAVIST) 1 MMOL/ML injection 6 mL (6 mLs Intravenous Contrast Given 02/25/18 1740)     Initial Impression / Assessment and Plan / ED Course  I have reviewed the triage vital signs and the nursing notes.  Pertinent labs & imaging results that were available during my care of the patient were reviewed by me and considered in my medical decision making (see chart for details).     At neurologic baseline when I compare current exam to previously documented exams from presentation in 2018 w/ CVA. He's had no infectious symptoms to suggest meningitis/encephalitis. I reviewed previous work up which included head CT and brain MRI, both of which  were negative for acute findings to explain symptoms. He does admit to taking NSAIDS on a schedule for the last 2 weeks to try to help, which raises the possibility of rebound headaches. I have recommended discontinuing NSAID use and switching to tylenol.  Gave migraine cocktail. Labs from triage reassuring, WBC 15.9 likely due to steroid course.   I discussed his case with Dr. Leonel Ramsay, neurologist, given its complexity and his recent negative testing.  We discussed the differential of venous sinus thrombosis or aneurysm.  Will obtain MRI brain with contrast as well as MRA head and neck and MRV head.  All imaging negative for acute pathology.  He does have sphenoid sinus disease and possible fungal ball.  I have given him a prescription for Augmentin and recommended that he follow-up with ENT to evaluate whether or not this could be contributing to his symptoms.  I have also commended neurology clinic follow-up for his headaches.  Instructed to discontinue all NSAID use.  Wife voiced concern about generalized weakness that has been persistent and ongoing.  Instructed to follow-up with PCP.  Return precautions reviewed and they voiced understanding.  Final Clinical Impressions(s) / ED Diagnoses   Final diagnoses:  Generalized weakness  Other migraine with status migrainosus, intractable  Chronic sphenoidal sinusitis    ED Discharge Orders         Ordered    Ambulatory referral to Neurology    Comments:  An appointment is requested in approximately: 2 weeks   02/25/18 2108    amoxicillin-clavulanate (AUGMENTIN) 875-125 MG tablet  Every 12 hours     02/25/18 2109           River Mckercher, [name], MD 02/25/18 [phone]

## 2018-04-09 ENCOUNTER — Institutional Professional Consult (permissible substitution): Payer: Medicare Other | Admitting: Neurology

## 2018-04-09 ENCOUNTER — Encounter

## 2019-09-24 ENCOUNTER — Telehealth: Payer: Self-pay | Admitting: Cardiology

## 2019-09-24 NOTE — Telephone Encounter (Signed)
Patient's wife, Gavin Pound, called to see if patient could get a sooner appointment than the one scheduled on 11/05/19 with Dr. Antoine Poche, nothing sooner available. She states that the patient has been very weak and is having problems walking. They specifically asked for Dr. Antoine Poche since he had seen the patient in the past when he had his heart attack and would really like to stay with him. Please advise.

## 2019-09-24 NOTE — Telephone Encounter (Signed)
Pts wife called to report that the pt has been having extreme weakness and he has h/o CVA from years ago. He is not having any CVA symptoms but just very tired all of the time and lacking much of his normal stamina. He denies dizziness, chest pain, no SOB. This has been progressively getting worse over the past several weeks.   He has an appt with Dr. Antoine Poche... he has not been seen since 2017... she did not realized it had been so long.. I was unable to get them a sooner Cardiology appt... they will keep that appt with the hopes that if there is a cancellation maybe he can be seen sooner.   They are going to call his PCP about  Being seen soon and possibly having some labs and maybe a Neuro appt if needed.   I will forward to Dr. Jenene Slicker nurse for review and if any appt spots open up prior to 11/05/19 to hopefully call and get him in.

## 2019-10-21 ENCOUNTER — Other Ambulatory Visit: Payer: Self-pay

## 2019-10-21 ENCOUNTER — Emergency Department (HOSPITAL_COMMUNITY): Payer: Medicare Other

## 2019-10-21 ENCOUNTER — Inpatient Hospital Stay (HOSPITAL_COMMUNITY)
Admission: EM | Admit: 2019-10-21 | Discharge: 2019-10-24 | DRG: 176 | Disposition: A | Discharge status: Home / Self Care | Payer: Medicare Other | Attending: Family Medicine | Admitting: Family Medicine

## 2019-10-21 DIAGNOSIS — R072 Precordial pain: Secondary | ICD-10-CM

## 2019-10-21 DIAGNOSIS — I2699 Other pulmonary embolism without acute cor pulmonale: Secondary | ICD-10-CM | POA: Diagnosis not present

## 2019-10-21 DIAGNOSIS — D649 Anemia, unspecified: Secondary | ICD-10-CM | POA: Diagnosis present

## 2019-10-21 DIAGNOSIS — Z7982 Long term (current) use of aspirin: Secondary | ICD-10-CM

## 2019-10-21 DIAGNOSIS — M21379 Foot drop, unspecified foot: Secondary | ICD-10-CM | POA: Diagnosis present

## 2019-10-21 DIAGNOSIS — I82442 Acute embolism and thrombosis of left tibial vein: Secondary | ICD-10-CM | POA: Diagnosis present

## 2019-10-21 DIAGNOSIS — I2510 Atherosclerotic heart disease of native coronary artery without angina pectoris: Secondary | ICD-10-CM | POA: Diagnosis present

## 2019-10-21 DIAGNOSIS — Z79899 Other long term (current) drug therapy: Secondary | ICD-10-CM

## 2019-10-21 DIAGNOSIS — Y92009 Unspecified place in unspecified non-institutional (private) residence as the place of occurrence of the external cause: Secondary | ICD-10-CM

## 2019-10-21 DIAGNOSIS — I82402 Acute embolism and thrombosis of unspecified deep veins of left lower extremity: Secondary | ICD-10-CM | POA: Diagnosis present

## 2019-10-21 DIAGNOSIS — Z794 Long term (current) use of insulin: Secondary | ICD-10-CM

## 2019-10-21 DIAGNOSIS — Z955 Presence of coronary angioplasty implant and graft: Secondary | ICD-10-CM

## 2019-10-21 DIAGNOSIS — Z888 Allergy status to other drugs, medicaments and biological substances status: Secondary | ICD-10-CM

## 2019-10-21 DIAGNOSIS — Z833 Family history of diabetes mellitus: Secondary | ICD-10-CM

## 2019-10-21 DIAGNOSIS — E119 Type 2 diabetes mellitus without complications: Secondary | ICD-10-CM | POA: Diagnosis present

## 2019-10-21 DIAGNOSIS — I6930 Unspecified sequelae of cerebral infarction: Secondary | ICD-10-CM

## 2019-10-21 DIAGNOSIS — R778 Other specified abnormalities of plasma proteins: Secondary | ICD-10-CM

## 2019-10-21 DIAGNOSIS — I69354 Hemiplegia and hemiparesis following cerebral infarction affecting left non-dominant side: Secondary | ICD-10-CM

## 2019-10-21 DIAGNOSIS — N179 Acute kidney failure, unspecified: Secondary | ICD-10-CM | POA: Diagnosis present

## 2019-10-21 DIAGNOSIS — R0609 Other forms of dyspnea: Secondary | ICD-10-CM

## 2019-10-21 DIAGNOSIS — I82432 Acute embolism and thrombosis of left popliteal vein: Secondary | ICD-10-CM | POA: Diagnosis present

## 2019-10-21 DIAGNOSIS — I10 Essential (primary) hypertension: Secondary | ICD-10-CM | POA: Diagnosis present

## 2019-10-21 DIAGNOSIS — I252 Old myocardial infarction: Secondary | ICD-10-CM

## 2019-10-21 DIAGNOSIS — I82452 Acute embolism and thrombosis of left peroneal vein: Secondary | ICD-10-CM | POA: Diagnosis present

## 2019-10-21 DIAGNOSIS — Z20822 Contact with and (suspected) exposure to covid-19: Secondary | ICD-10-CM | POA: Diagnosis present

## 2019-10-21 DIAGNOSIS — Z8249 Family history of ischemic heart disease and other diseases of the circulatory system: Secondary | ICD-10-CM

## 2019-10-21 LAB — BASIC METABOLIC PANEL
Anion gap: 11 (ref 5–15)
BUN: 24 mg/dL — ABNORMAL HIGH (ref 8–23)
CO2: 25 mmol/L (ref 22–32)
Calcium: 9.1 mg/dL (ref 8.9–10.3)
Chloride: 102 mmol/L (ref 98–111)
Creatinine, Ser: 1.63 mg/dL — ABNORMAL HIGH (ref 0.61–1.24)
GFR calc Af Amer: 47 mL/min — ABNORMAL LOW (ref >60)
GFR calc non Af Amer: 41 mL/min — ABNORMAL LOW (ref >60)
Glucose, Bld: 105 mg/dL — ABNORMAL HIGH (ref 70–99)
Potassium: 4.1 mmol/L (ref 3.5–5.1)
Sodium: 138 mmol/L (ref 135–145)

## 2019-10-21 LAB — CBC
HCT: 39.7 % (ref 39.0–52.0)
Hemoglobin: 12.9 g/dL — ABNORMAL LOW (ref 13.0–17.0)
MCH: 30 pg (ref 26.0–34.0)
MCHC: 32.5 g/dL (ref 30.0–36.0)
MCV: 92.3 fL (ref 80.0–100.0)
Platelets: 168 10*3/uL (ref 150–400)
RBC: 4.3 MIL/uL (ref 4.22–5.81)
RDW: 13 % (ref 11.5–15.5)
WBC: 8.2 10*3/uL (ref 4.0–10.5)
nRBC: 0 % (ref 0.0–0.2)

## 2019-10-21 LAB — TROPONIN I (HIGH SENSITIVITY)
Troponin I (High Sensitivity): 72 ng/L — ABNORMAL HIGH (ref <18)
Troponin I (High Sensitivity): 67 ng/L — ABNORMAL HIGH (ref <18)

## 2019-10-21 NOTE — ED Triage Notes (Signed)
Pt arrives via POV from home. Reports slip and fall this morning in the garage. No loss of consciousness per patient. Reports having exertional SOB with light activity. Pt reports left sided chest pain described as a dullache. Hx of previous stents.

## 2019-10-21 NOTE — ED Notes (Signed)
Repeat EKG shown to Dr. Jacqulyn Bath, no new orders at this time. Pt mentating well, skin, warm, dry, good pulses. Pt c/o pain to left elbow. Will continue to monitor.

## 2019-10-21 NOTE — ED Notes (Signed)
Pt c/o continued chest pain with radiation to jaw and arms. Pt taken to triage for repeat assessment and ekg

## 2019-10-22 ENCOUNTER — Inpatient Hospital Stay (HOSPITAL_COMMUNITY): Payer: Medicare Other

## 2019-10-22 ENCOUNTER — Emergency Department (HOSPITAL_COMMUNITY): Payer: Medicare Other

## 2019-10-22 DIAGNOSIS — I2510 Atherosclerotic heart disease of native coronary artery without angina pectoris: Secondary | ICD-10-CM | POA: Diagnosis present

## 2019-10-22 DIAGNOSIS — Z794 Long term (current) use of insulin: Secondary | ICD-10-CM | POA: Diagnosis not present

## 2019-10-22 DIAGNOSIS — I2602 Saddle embolus of pulmonary artery with acute cor pulmonale: Secondary | ICD-10-CM

## 2019-10-22 DIAGNOSIS — I82452 Acute embolism and thrombosis of left peroneal vein: Secondary | ICD-10-CM | POA: Diagnosis present

## 2019-10-22 DIAGNOSIS — D649 Anemia, unspecified: Secondary | ICD-10-CM

## 2019-10-22 DIAGNOSIS — I2699 Other pulmonary embolism without acute cor pulmonale: Secondary | ICD-10-CM | POA: Diagnosis present

## 2019-10-22 DIAGNOSIS — R072 Precordial pain: Secondary | ICD-10-CM | POA: Diagnosis present

## 2019-10-22 DIAGNOSIS — I69354 Hemiplegia and hemiparesis following cerebral infarction affecting left non-dominant side: Secondary | ICD-10-CM | POA: Diagnosis not present

## 2019-10-22 DIAGNOSIS — I82402 Acute embolism and thrombosis of unspecified deep veins of left lower extremity: Secondary | ICD-10-CM | POA: Diagnosis present

## 2019-10-22 DIAGNOSIS — M7989 Other specified soft tissue disorders: Secondary | ICD-10-CM

## 2019-10-22 DIAGNOSIS — Y92009 Unspecified place in unspecified non-institutional (private) residence as the place of occurrence of the external cause: Secondary | ICD-10-CM

## 2019-10-22 DIAGNOSIS — Z7982 Long term (current) use of aspirin: Secondary | ICD-10-CM | POA: Diagnosis not present

## 2019-10-22 DIAGNOSIS — Z79899 Other long term (current) drug therapy: Secondary | ICD-10-CM | POA: Diagnosis not present

## 2019-10-22 DIAGNOSIS — I10 Essential (primary) hypertension: Secondary | ICD-10-CM | POA: Diagnosis present

## 2019-10-22 DIAGNOSIS — Z955 Presence of coronary angioplasty implant and graft: Secondary | ICD-10-CM | POA: Diagnosis not present

## 2019-10-22 DIAGNOSIS — N179 Acute kidney failure, unspecified: Secondary | ICD-10-CM | POA: Diagnosis present

## 2019-10-22 DIAGNOSIS — I6930 Unspecified sequelae of cerebral infarction: Secondary | ICD-10-CM

## 2019-10-22 DIAGNOSIS — W19XXXA Unspecified fall, initial encounter: Secondary | ICD-10-CM | POA: Diagnosis not present

## 2019-10-22 DIAGNOSIS — Z888 Allergy status to other drugs, medicaments and biological substances status: Secondary | ICD-10-CM | POA: Diagnosis not present

## 2019-10-22 DIAGNOSIS — I82432 Acute embolism and thrombosis of left popliteal vein: Secondary | ICD-10-CM

## 2019-10-22 DIAGNOSIS — Z8249 Family history of ischemic heart disease and other diseases of the circulatory system: Secondary | ICD-10-CM | POA: Diagnosis not present

## 2019-10-22 DIAGNOSIS — I82442 Acute embolism and thrombosis of left tibial vein: Secondary | ICD-10-CM | POA: Diagnosis present

## 2019-10-22 DIAGNOSIS — E119 Type 2 diabetes mellitus without complications: Secondary | ICD-10-CM | POA: Diagnosis present

## 2019-10-22 DIAGNOSIS — I252 Old myocardial infarction: Secondary | ICD-10-CM | POA: Diagnosis not present

## 2019-10-22 DIAGNOSIS — Z20822 Contact with and (suspected) exposure to covid-19: Secondary | ICD-10-CM | POA: Diagnosis present

## 2019-10-22 DIAGNOSIS — Z833 Family history of diabetes mellitus: Secondary | ICD-10-CM | POA: Diagnosis not present

## 2019-10-22 DIAGNOSIS — M21379 Foot drop, unspecified foot: Secondary | ICD-10-CM | POA: Diagnosis present

## 2019-10-22 LAB — URINALYSIS, ROUTINE W REFLEX MICROSCOPIC
Bacteria, UA: NONE SEEN
Bilirubin Urine: NEGATIVE
Glucose, UA: >=500 mg/dL — AB
Ketones, ur: 5 mg/dL — AB
Leukocytes,Ua: NEGATIVE
Nitrite: NEGATIVE
Protein, ur: NEGATIVE mg/dL
Specific Gravity, Urine: 1.029 (ref 1.005–1.030)
pH: 6 (ref 5.0–8.0)

## 2019-10-22 LAB — ECHOCARDIOGRAM COMPLETE
Height: 72 in
S' Lateral: 3.1 cm
Weight: 2640 oz

## 2019-10-22 LAB — GLUCOSE, CAPILLARY: Glucose-Capillary: 257 mg/dL — ABNORMAL HIGH (ref 70–99)

## 2019-10-22 LAB — HEMOGLOBIN A1C
Hgb A1c MFr Bld: 8.4 % — ABNORMAL HIGH (ref 4.8–5.6)
Mean Plasma Glucose: 194.38 mg/dL

## 2019-10-22 LAB — CBG MONITORING, ED: Glucose-Capillary: 254 mg/dL — ABNORMAL HIGH (ref 70–99)

## 2019-10-22 LAB — HEPARIN LEVEL (UNFRACTIONATED): Heparin Unfractionated: 0.72 IU/mL — ABNORMAL HIGH (ref 0.30–0.70)

## 2019-10-22 LAB — SARS CORONAVIRUS 2 BY RT PCR (HOSPITAL ORDER, PERFORMED IN ~~LOC~~ HOSPITAL LAB): SARS Coronavirus 2: NEGATIVE

## 2019-10-22 LAB — BRAIN NATRIURETIC PEPTIDE: B Natriuretic Peptide: 155.3 pg/mL — ABNORMAL HIGH (ref 0.0–100.0)

## 2019-10-22 MED ORDER — ACETAMINOPHEN 325 MG PO TABS
650.0000 mg | ORAL_TABLET | Freq: Four times a day (QID) | ORAL | Status: DC | PRN
  Administered 2019-10-23: 650 mg via ORAL
  Filled 2019-10-22: qty 2

## 2019-10-22 MED ORDER — SODIUM CHLORIDE 0.9% FLUSH
3.0000 mL | Freq: Two times a day (BID) | INTRAVENOUS | Status: DC
  Administered 2019-10-23 – 2019-10-24 (×2): 3 mL via INTRAVENOUS

## 2019-10-22 MED ORDER — ACETAMINOPHEN 650 MG RE SUPP: 650.0000 mg | Freq: Four times a day (QID) | RECTAL | Status: DC | PRN

## 2019-10-22 MED ORDER — SODIUM CHLORIDE 0.9 % IV SOLN: Freq: Once | INTRAVENOUS | Status: AC

## 2019-10-22 MED ORDER — ALBUTEROL SULFATE (2.5 MG/3ML) 0.083% IN NEBU: 2.5000 mg | INHALATION_SOLUTION | Freq: Four times a day (QID) | RESPIRATORY_TRACT | Status: DC | PRN

## 2019-10-22 MED ORDER — INSULIN ASPART 100 UNIT/ML ~~LOC~~ SOLN
0.0000 [IU] | Freq: Every day | SUBCUTANEOUS | Status: DC
  Administered 2019-10-22 – 2019-10-23 (×2): 3 [IU] via SUBCUTANEOUS

## 2019-10-22 MED ORDER — ASPIRIN 81 MG PO CHEW
324.0000 mg | CHEWABLE_TABLET | Freq: Once | ORAL | Status: AC
  Administered 2019-10-22: 324 mg via ORAL
  Filled 2019-10-22: qty 4

## 2019-10-22 MED ORDER — NITROGLYCERIN 0.4 MG SL SUBL
0.4000 mg | SUBLINGUAL_TABLET | SUBLINGUAL | Status: DC | PRN
  Administered 2019-10-22: 0.4 mg via SUBLINGUAL
  Filled 2019-10-22: qty 1

## 2019-10-22 MED ORDER — IOHEXOL 350 MG/ML SOLN
100.0000 mL | Freq: Once | INTRAVENOUS | Status: AC | PRN
  Administered 2019-10-22: 60 mL via INTRAVENOUS

## 2019-10-22 MED ORDER — ONDANSETRON HCL 4 MG/2ML IJ SOLN: 4.0000 mg | Freq: Four times a day (QID) | INTRAMUSCULAR | Status: DC | PRN

## 2019-10-22 MED ORDER — HEPARIN (PORCINE) 25000 UT/250ML-% IV SOLN
1150.0000 [IU]/h | INTRAVENOUS | Status: DC
  Administered 2019-10-22: 1250 [IU]/h via INTRAVENOUS
  Administered 2019-10-23 – 2019-10-24 (×2): 1150 [IU]/h via INTRAVENOUS
  Filled 2019-10-22 (×3): qty 250

## 2019-10-22 MED ORDER — INSULIN ASPART 100 UNIT/ML ~~LOC~~ SOLN
0.0000 [IU] | Freq: Three times a day (TID) | SUBCUTANEOUS | Status: DC
  Administered 2019-10-22: 5 [IU] via SUBCUTANEOUS
  Administered 2019-10-23: 7 [IU] via SUBCUTANEOUS
  Administered 2019-10-23: 5 [IU] via SUBCUTANEOUS
  Administered 2019-10-23: 3 [IU] via SUBCUTANEOUS
  Administered 2019-10-24: 2 [IU] via SUBCUTANEOUS
  Administered 2019-10-24: 3 [IU] via SUBCUTANEOUS

## 2019-10-22 MED ORDER — HEPARIN BOLUS VIA INFUSION
4000.0000 [IU] | Freq: Once | INTRAVENOUS | Status: AC
  Administered 2019-10-22: 4000 [IU] via INTRAVENOUS
  Filled 2019-10-22: qty 4000

## 2019-10-22 MED ORDER — ONDANSETRON HCL 4 MG PO TABS: 4.0000 mg | ORAL_TABLET | Freq: Four times a day (QID) | ORAL | Status: DC | PRN

## 2019-10-22 NOTE — ED Provider Notes (Signed)
[facility]  CSN: [id number] Arrival date & time: 10/21/19 1731  Chief Complaint(s) Shortness of Breath, Chest Pain, and Fall  HPI Grant Sparks is a 74 y.o. male with a past medical history listed below including prior MI status post stenting, diabetes and prior CVA with left-sided deficits who presents to the emergency department with 3 to 4 days of dyspnea on exertion that has worsened since onset.  He reports that shortness of breath is now with very minimal activity including bending down to tie shoes.  Patient reports swelling to the left lower extremity.  No prior history of heart failure or PEs.  Earlier today he began having left-sided chest pain that was exertional and radiating to the left arm.  Improved with rest.  Episode lasted a few hours.  It resolved spontaneously but returned while waiting in the emergency waiting room.  Patient has not tried taking any nitroglycerin.  He denies any recent fevers or infections.  No coughing or congestion.  He reports that he was tested for Covid 2 days ago at the health department and negative.   HPI  Past Medical History Past Medical History:  Diagnosis Date  . CAD (coronary artery disease)    S/P stenting of the RCA x2 and PTCA of a PDA lesion in Jan 2004. Pt had a myoracidal infarction when he was in rehabilitation following a CVA.  [name]. CVA (cerebral vascular accident) (HCC)    Right  CVA, left hemiparesis  . Diabetes mellitus without complication (HCC)   . MI (myocardial infarction) [facility](HCC)    Patient Active Problem List   Diagnosis Date Noted  . Cerebral thrombosis with cerebral infarction 10/14/2016  . Normocytic anemia 10/14/2016  . Facial droop 10/13/2016  . Benign essential hypertension 10/13/2016  . Controlled type 2 diabetes mellitus with stage 3 chronic kidney disease, with long-term current use of insulin (HCC) 10/13/2016  . CVA (cerebral vascular accident) (HCC)   . CAD  03/01/2008  . CORONARY ARTERY DISEASE, S/P PTCA 03/01/2008  . Cerebral artery occlusion with cerebral infarction (HCC) 03/01/2008   Home Medication(s) Prior to Admission medications   Medication Sig Start Date End Date Taking? Authorizing Provider  amLODipine (NORVASC) 5 MG tablet Take 5 mg by mouth daily. 02/10/18 02/10/19  [provider]  amoxicillin-clavulanate (AUGMENTIN) 875-125 MG tablet Take 1 tablet by mouth every 12 (twelve) hours. 02/25/18   Little, Ambrose Finlandachel Morgan, MD  aspirin EC 325 MG tablet Take 1 tablet (325 mg total) by mouth daily. Patient taking differently: Take 162 mg by mouth 2 (two) times daily.  12/03/16   Micki RileySethi, Pramod S, MD  atorvastatin (LIPITOR) 40 MG tablet Take 40 mg by mouth daily.  12/09/17   [provider]  glipiZIDE (GLUCOTROL XL) 5 MG 24 hr tablet Take 5 mg by mouth every morning.  07/04/15   [provider]  hydrochlorothiazide (MICROZIDE) 12.5 MG capsule Take 12.5 mg by mouth daily.    [provider]  insulin detemir (LEVEMIR FLEXPEN) 100 UNIT/ML injection Inject 30 Units into the skin 2 (two) times daily.     [provider]  metFORMIN (GLUCOPHAGE) 1000 MG tablet Take 1,000 mg by mouth 2 (two) times daily.     [provider]  Methylcellulose, Laxative, (CITRUCEL) 500 MG TABS Take 500 mg by mouth at bedtime.    [provider]  metoprolol (LOPRESSOR) 50 MG tablet Take 50 mg by mouth 2 (two) times daily.  [provider]  naproxen sodium (ALEVE) 220 MG tablet Take 440 mg by mouth as needed (pain).    [provider]  nitroGLYCERIN (NITROSTAT) 0.4 MG SL tablet Place 1 tablet (0.4 mg total) under the tongue as needed. 07/19/15   [name], MD  OVER THE COUNTER MEDICATION Take 1 tablet by mouth at bedtime. [facility] 4-in 1    [provider]  predniSONE (DELTASONE) 20 MG tablet Take 3 tablets (60 mg total) by mouth daily. Patient taking differently: Take 40 mg by  mouth daily. 40 mg for three days  20 mg for three days 10 mg for three days 02/12/18   Sabas Sous, MD  ramipril (ALTACE) 5 MG capsule Take 5 mg by mouth daily.    [provider]                                                                                                                                    Past Surgical History Past Surgical History:  Procedure Laterality Date  . APPENDECTOMY    . CATARACT EXTRACTION    . CERVICAL SPINE SURGERY    . coronary artery stent placement     2004   Family History Family History  Problem Relation Age of Onset  . Heart attack Father 98       MI  . Diabetes Father   . Heart attack Brother 21       MI  (might have been a PE)  . Diabetes Brother   . Heart attack Mother 79  . Diabetes Mother   . Diabetes Sister     Social History Social History   Tobacco Use  . Smoking status: Never Smoker  . Smokeless tobacco: Never Used  Substance Use Topics  . Alcohol use: No  . Drug use: No   Allergies Ambien [zolpidem] and Tadalafil  Review of Systems Review of Systems All other systems are reviewed and are negative for acute change except as noted in the HPI  Physical Exam Vital Signs  I have reviewed the triage vital signs BP (!) 114/103 (BP Location: Left Arm)   Pulse 80   Temp 98.4 F (36.9 C) (Oral)   Resp 18   Ht 6' (1.829 m)   Wt 74.8 kg   SpO2 97%   BMI 22.38 kg/m   Physical Exam Vitals reviewed.  Constitutional:      General: He is not in acute distress.    Appearance: He is well-developed. He is not diaphoretic.  HENT:     Head: Normocephalic and atraumatic.     Nose: Nose normal.  Eyes:     General: No scleral icterus.       Right eye: No discharge.        Left eye: No discharge.     Conjunctiva/sclera: Conjunctivae normal.     Pupils: Pupils are equal, round, and reactive to light.  Cardiovascular:     Rate and Rhythm: Normal rate and regular rhythm.     Heart sounds: No murmur heard.    No friction rub. No gallop.   Pulmonary:     Effort: Pulmonary effort is normal. No respiratory distress.     Breath sounds: Normal breath sounds. No stridor. No rales.  Abdominal:     General: There is no distension.     Palpations: Abdomen is soft.     Tenderness: There is no abdominal tenderness.  Musculoskeletal:        General: No tenderness.     Cervical back: Normal range of motion and neck supple.     Left lower leg: 1+ Edema present.  Skin:    General: Skin is warm and dry.     Findings: No erythema or rash.  Neurological:     Mental Status: He is alert and oriented to person, place, and time.     ED Results and Treatments Labs (all labs ordered are listed, but only abnormal results are displayed) Labs Reviewed  BASIC METABOLIC PANEL - Abnormal; Notable for the following components:      Result Value   Glucose, Bld 105 (*)    BUN 24 (*)    Creatinine, Ser 1.63 (*)    GFR calc non Af Amer 41 (*)    GFR calc Af Amer 47 (*)    All other components within normal limits  CBC - Abnormal; Notable for the following components:   Hemoglobin 12.9 (*)    All other components within normal limits  BRAIN NATRIURETIC PEPTIDE - Abnormal; Notable for the following components:   B Natriuretic Peptide 155.3 (*)    All other components within normal limits  TROPONIN I (HIGH SENSITIVITY) - Abnormal; Notable for the following components:   Troponin I (High Sensitivity) 72 (*)    All other components within normal limits  TROPONIN I (HIGH SENSITIVITY) - Abnormal; Notable for the following components:   Troponin I (High Sensitivity) 67 (*)    All other components within normal limits  SARS CORONAVIRUS 2 BY RT PCR [facility] ORDER, PERFORMED IN [facility] LAB)                                                                                                                         EKG  EKG Interpretation  Date/Time:  Wednesday October 21 2019 21:30:47 EDT Ventricular Rate:   84 PR Interval:  150 QRS Duration: 140 QT Interval:  422 QTC Calculation: 498 R Axis:   -67 Text Interpretation: Normal sinus rhythm 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 Similar to 2019 tracing Confirmed by Alona Bene [phone]) on 10/21/2019 9:37:00 PM      Radiology DG Chest 2 View  Result Date: 10/21/2019 CLINICAL DATA:  Chest pain. Shortness of breath. Symptoms for 2-3 days, progressive. EXAM: CHEST - 2 VIEW COMPARISON:  Radiograph 04/17/2016 FINDINGS: Heart is normal in  size. Normal mediastinal contours. Aortic atherosclerosis. No pulmonary edema. Minor subsegmental atelectasis at the lung bases. No confluent airspace disease. No pneumothorax or pleural effusion. Surgical hardware in the lower cervical spine. No acute osseous abnormalities are seen. IMPRESSION: Minor bibasilar atelectasis. Aortic Atherosclerosis (ICD10-I70.0). Electronically Signed   By: Narda Rutherford M.D.   On: 10/21/2019 18:49    Pertinent labs & imaging results that were available during my care of the patient were reviewed by me and considered in my medical decision making (see chart for details).  Medications Ordered in ED Medications  nitroGLYCERIN (NITROSTAT) SL tablet 0.4 mg (0.4 mg Sublingual Given 10/22/19 0646)  aspirin chewable tablet 324 mg (324 mg Oral Given 10/22/19 0644)                                                                                                                                    Procedures .1-3 Lead EKG Interpretation Performed by: Nira Conn, MD Authorized by: Nira Conn, MD     Interpretation: normal     ECG rate:  80   ECG rate assessment: normal     Rhythm: sinus rhythm     Ectopy: none     Conduction: normal   .Critical Care Performed by: Nira Conn, MD Authorized by: Nira Conn, MD    CRITICAL CARE Performed by: Amadeo Garnet Etheridge Geil Total critical care  time: 32 minutes Critical care time was exclusive of separately billable procedures and treating other patients. Critical care was necessary to treat or prevent imminent or life-threatening deterioration. Critical care was time spent personally by me on the following activities: development of treatment plan with patient and/or surrogate as well as nursing, discussions with consultants, evaluation of patient's response to treatment, examination of patient, obtaining history from patient or surrogate, ordering and performing treatments and interventions, ordering and review of laboratory studies, ordering and review of radiographic studies, pulse oximetry and re-evaluation of patient's condition.   (including critical care time)  Medical Decision Making / ED Course I have reviewed the nursing notes for this encounter and the patient's prior records (if available in EHR or on provided paperwork).   Grant Sparks was evaluated in Emergency Department on 10/22/2019 for the symptoms described in the history of present illness. He was evaluated in the context of the global COVID-19 pandemic, which necessitated consideration that the patient might be at risk for infection with the SARS-CoV-2 virus that causes COVID-19. Institutional protocols and algorithms that pertain to the evaluation of patients at risk for COVID-19 are in a state of rapid change based on information released by regulatory bodies including the CDC and federal and state organizations. These policies and algorithms were followed during the patient's care in the ED.  Patient presents with several days of dyspnea on exertion as well as very minimal activity. Patient's EKG without acute ischemic changes or evidence of pericarditis.  Triage labs  notable for elevated troponin.  Chest x-ray without evidence suggestive of pneumonia, pneumothorax, pneumomediastinum.  No abnormal contour of the mediastinum to suggest dissection. No evidence of  acute injuries.  Low suspicion for dissection.  We will obtain a CTA to rule out PE.  Will obtain a BNp.  Pain improved with nitroglycerin. Given ASA.  Plan to admit given his increased WOB after results. If CT negative, there is a concern for ACS.  Patient care turned over to Dr Adela Lank. Patient case and results discussed in detail; please see their note for further ED managment.          Final Clinical Impression(s) / ED Diagnoses Final diagnoses:  DOE (dyspnea on exertion)  Precordial pain  Elevated troponin      This chart was dictated using voice recognition software.  Despite best efforts to proofread,  errors can occur which can change the documentation meaning.   Nira Conn, MD 10/22/19 [phone]

## 2019-10-22 NOTE — Progress Notes (Signed)
  Echocardiogram 2D Echocardiogram has been performed.  Grant Sparks 10/22/2019, 9:31 AM

## 2019-10-22 NOTE — ED Notes (Signed)
Dinner ordered 

## 2019-10-22 NOTE — Progress Notes (Signed)
Lower extremity venous bilateral study completed  Preliminary results relayed to Katrinka Blazing, MD.   See CV Proc for preliminary results report.   Grant Sparks

## 2019-10-22 NOTE — Progress Notes (Signed)
ANTICOAGULATION CONSULT NOTE - Initial Consult  Pharmacy Consult for heparin Indication: pulmonary embolus  Allergies  Allergen Reactions  . Ambien [Zolpidem] Other (See Comments)    Hallucinate/manic  . Tadalafil Other (See Comments)    hallucinations    Patient Measurements: Height: 6' (182.9 cm) Weight: 74.8 kg (165 lb) IBW/kg (Calculated) : 77.6 Heparin Dosing Weight: 74.8 kg   Vital Signs: Temp: 98.4 F (36.9 C) (08/26 0529) Temp Source: Oral (08/26 0529) BP: 114/103 (08/26 0529) Pulse Rate: 80 (08/26 0529)  Labs: Recent Labs    10/21/19 1741 10/21/19 2021  HGB 12.9*  --   HCT 39.7  --   PLT 168  --   CREATININE 1.63*  --   TROPONINIHS 72* 67*    Estimated Creatinine Clearance: 42.1 mL/min (A) (by C-G formula based on SCr of 1.63 mg/dL (H)).   Medical History: Past Medical History:  Diagnosis Date  . CAD (coronary artery disease)    S/P stenting of the RCA x2 and PTCA of a PDA lesion in Jan 2004. Pt had a myoracidal infarction when he was in rehabilitation following a CVA.  [name] CVA (cerebral vascular accident) (HCC)    Right  CVA, left hemiparesis  . Diabetes mellitus without complication (HCC)   . MI (myocardial infarction) (HCC)     Medications:  (Not in a hospital admission)   Assessment: 11 YOM who presents to the ED with dyspnea found to have CT evidence of PE with right heart strain. Pharmacy consulted to start IV heparin. Hgb mildly low, Plt wnl. SCr elevated at 1.63  Goal of Therapy:  Heparin level 0.3-0.7 units/ml Monitor platelets by anticoagulation protocol: Yes   Plan:  -Heparin 4000 units IV bolus followed by IV heparin at 1250 units/hr  -F/u 8 hr HL -Monitor daily HL, CBC and s/s of bleeding   Vinnie Level, PharmD., BCPS, BCCCP Clinical Pharmacist Clinical phone for 10/22/19 until 3:30pm: [phone] If after 3:30pm, please refer to [facility] for unit-specific pharmacist

## 2019-10-22 NOTE — ED Notes (Signed)
Patient transported to CT 

## 2019-10-22 NOTE — Consult Note (Signed)
NAME:  Grant Sparks, MRN:  [id number], DOB:  July 30, 1945, LOS: 0 ADMISSION DATE:  10/21/2019, CONSULTATION DATE:  10/22/2019 REFERRING MD: EDP, CHIEF COMPLAINT: Pulmonary embolism  Brief History   74 year old with diabetes, coronary artery disease, CVA presenting with dyspnea, chest pain.  No syncope.  Found to have submassive PE.  PCCM consulted for recommendations.  Patient had long car trip to [place] in June and reports right leg swelling.  However the swelling preceded the catheter.  Past Medical History    has a past medical history of CAD (coronary artery disease), CVA (cerebral vascular accident) (HCC), Diabetes mellitus without complication (HCC), and MI (myocardial infarction) (HCC).  Significant Hospital Events     Consults:   PCCM  Procedures:    Significant Diagnostic Tests:   CTA 10/22/2019-large bilateral PE with RV/LV ratio 0.94.  Coronary, aortic atherosclerosis.  I have reviewed the images personally  Echocardiogram 10/22/2019-LVEF 70-75%, normal RV size and function  Micro Data:    Antimicrobials:    Interim history/subjective:    Objective   Blood pressure (!) 144/65, pulse 100, temperature 98.4 F (36.9 C), temperature source Oral, resp. rate (!) 22, height 6' (1.829 m), weight 74.8 kg, SpO2 93 %.       No intake or output data in the 24 hours ending 10/22/19 0857 Filed Weights   10/21/19 1739  Weight: 74.8 kg    Examination: Gen:      No acute distress HEENT:  EOMI, sclera anicteric Neck:     No masses; no thyromegaly Lungs:    Clear to auscultation bilaterally; normal respiratory effort CV:         Regular rate and rhythm; no murmurs Abd:      + bowel sounds; soft, non-tender; no palpable masses, no distension Ext:    No edema; adequate peripheral perfusion Skin:      Warm and dry; no rash Neuro: alert and oriented x 3 Psych: normal mood and affect  Resolved Hospital Problem list     Assessment & Plan:  Submassive PE Though he  has borderline elevated RV/LV ratio and mildly elevated troponin, BNP there is no evidence of RV dysfunction on echocardiogram. EKG reviewed with right bundle branch block that is old. sPESI score is 0 indicating low risk Clinically he is stable with normal blood pressure and on room air  No indication for lytics or catheter-based therapy at this point Agree with heparin anticoagulation and transition to oral anticoagulants.  PCCM will be available as needed.  Please call with questions.   Best practice:  Per primary team  Labs   CBC: Recent Labs  Lab 10/21/19 1741  WBC 8.2  HGB 12.9*  HCT 39.7  MCV 92.3  PLT 168    Basic Metabolic Panel: Recent Labs  Lab 10/21/19 1741  NA 138  K 4.1  CL 102  CO2 25  GLUCOSE 105*  BUN 24*  CREATININE 1.63*  CALCIUM 9.1   GFR: Estimated Creatinine Clearance: 42.1 mL/min (A) (by C-G formula based on SCr of 1.63 mg/dL (H)). Recent Labs  Lab 10/21/19 1741  WBC 8.2    Liver Function Tests: No results for input(s): AST, ALT, ALKPHOS, BILITOT, PROT, ALBUMIN in the last 168 hours. No results for input(s): LIPASE, AMYLASE in the last 168 hours. No results for input(s): AMMONIA in the last 168 hours.  ABG    Component Value Date/Time   TCO2 23 10/13/2016 1742     Coagulation Profile: No results for input(s):  INR, PROTIME in the last 168 hours.  Cardiac Enzymes: No results for input(s): CKTOTAL, CKMB, CKMBINDEX, TROPONINI in the last 168 hours.  HbA1C: Hgb A1c MFr Bld  Date/Time Value Ref Range Status  10/14/2016 03:41 AM 7.4 (H) 4.8 - 5.6 % Final    Comment:    (NOTE) Pre diabetes:          5.7%-6.4% Diabetes:              >6.4% Glycemic control for   <7.0% adults with diabetes     CBG: No results for input(s): GLUCAP in the last 168 hours.  Review of Systems:   REVIEW OF SYSTEMS:   All negative; except for those that are bolded, which indicate positives.  Constitutional: weight loss, weight gain, night  sweats, fevers, chills, fatigue, weakness.  HEENT: headaches, sore throat, sneezing, nasal congestion, post nasal drip, difficulty swallowing, tooth/dental problems, visual complaints, visual changes, ear aches. Neuro: difficulty with speech, weakness, numbness, ataxia. CV:  chest pain, orthopnea, PND, swelling in lower extremities, dizziness, palpitations, syncope.  Resp: cough, hemoptysis, dyspnea, wheezing. GI: heartburn, indigestion, abdominal pain, nausea, vomiting, diarrhea, constipation, change in bowel habits, loss of appetite, hematemesis, melena, hematochezia.  GU: dysuria, change in color of urine, urgency or frequency, flank pain, hematuria. MSK: joint pain or swelling, decreased range of motion. Psych: change in mood or affect, depression, anxiety, suicidal ideations, homicidal ideations. Skin: rash, itching, bruising.  Past Medical History  He,  has a past medical history of CAD (coronary artery disease), CVA (cerebral vascular accident) (HCC), Diabetes mellitus without complication (HCC), and MI (myocardial infarction) (HCC).   Surgical History    Past Surgical History:  Procedure Laterality Date  . APPENDECTOMY    . CATARACT EXTRACTION    . CERVICAL SPINE SURGERY    . coronary artery stent placement     2004     Social History   reports that he has never smoked. He has never used smokeless tobacco. He reports that he does not drink alcohol and does not use drugs.   Family History   His family history includes Diabetes in his brother, father, mother, and sister; Heart attack (age of onset: 37) in his brother; Heart attack (age of onset: 39) in his father; Heart attack (age of onset: 72) in his mother.   Allergies Allergies  Allergen Reactions  . Ambien [Zolpidem] Other (See Comments)    Hallucinate/manic  . Tadalafil Other (See Comments)    hallucinations     Home Medications  Prior to Admission medications   Medication Sig Start Date End Date Taking?  Authorizing Provider  amLODipine (NORVASC) 5 MG tablet Take 5 mg by mouth daily. 02/10/18 02/10/19  [provider]  amoxicillin-clavulanate (AUGMENTIN) 875-125 MG tablet Take 1 tablet by mouth every 12 (twelve) hours. 02/25/18   Little, Ambrose Finland, MD  aspirin EC 325 MG tablet Take 1 tablet (325 mg total) by mouth daily. Patient taking differently: Take 162 mg by mouth 2 (two) times daily.  12/03/16   Micki Riley, MD  atorvastatin (LIPITOR) 40 MG tablet Take 40 mg by mouth daily.  12/09/17   [provider]  glipiZIDE (GLUCOTROL XL) 5 MG 24 hr tablet Take 5 mg by mouth every morning.  07/04/15   [provider]  hydrochlorothiazide (MICROZIDE) 12.5 MG capsule Take 12.5 mg by mouth daily.    [provider]  insulin detemir (LEVEMIR FLEXPEN) 100 UNIT/ML injection Inject 30 Units into the skin 2 (two)  times daily.     [provider]  metFORMIN (GLUCOPHAGE) 1000 MG tablet Take 1,000 mg by mouth 2 (two) times daily.     [provider]  Methylcellulose, Laxative, (CITRUCEL) 500 MG TABS Take 500 mg by mouth at bedtime.    [provider]  metoprolol (LOPRESSOR) 50 MG tablet Take 50 mg by mouth 2 (two) times daily.      [provider]  naproxen sodium (ALEVE) 220 MG tablet Take 440 mg by mouth as needed (pain).    [provider]  nitroGLYCERIN (NITROSTAT) 0.4 MG SL tablet Place 1 tablet (0.4 mg total) under the tongue as needed. 07/19/15   [name], MD  OVER THE COUNTER MEDICATION Take 1 tablet by mouth at bedtime. Colon Health 4-in 1    [provider]  predniSONE (DELTASONE) 20 MG tablet Take 3 tablets (60 mg total) by mouth daily. Patient taking differently: Take 40 mg by mouth daily. 40 mg for three days  20 mg for three days 10 mg for three days 02/12/18   Sabas Sous, MD  ramipril (ALTACE) 5 MG capsule Take 5 mg by mouth daily.    [provider]     Critical care time: NA    Chilton Greathouse MD [facility] Please see [email or web address] for pager details.  10/22/2019, 10:10 AM

## 2019-10-22 NOTE — Progress Notes (Signed)
ANTICOAGULATION CONSULT NOTE - Initial Consult  Pharmacy Consult for heparin Indication: pulmonary embolus  Allergies  Allergen Reactions  . Ambien [Zolpidem] Other (See Comments)    Hallucinate/manic  . Tadalafil Other (See Comments)    hallucinations    Patient Measurements: Height: 6' (182.9 cm) Weight: 74.8 kg (165 lb) IBW/kg (Calculated) : 77.6 Heparin Dosing Weight: 74.8 kg   Vital Signs: BP: 142/65 (08/26 1630) Pulse Rate: 92 (08/26 1630)  Labs: Recent Labs    10/21/19 1741 10/21/19 2021 10/22/19 1640  HGB 12.9*  --   --   HCT 39.7  --   --   PLT 168  --   --   HEPARINUNFRC  --   --  0.72*  CREATININE 1.63*  --   --   TROPONINIHS 72* 67*  --     Estimated Creatinine Clearance: 42.1 mL/min (A) (by C-G formula based on SCr of 1.63 mg/dL (H)).   Medical History: Past Medical History:  Diagnosis Date  . CAD (coronary artery disease)    S/P stenting of the RCA x2 and PTCA of a PDA lesion in Jan 2004. Pt had a myoracidal infarction when he was in rehabilitation following a CVA.  [name] CVA (cerebral vascular accident) (HCC)    Right  CVA, left hemiparesis  . Diabetes mellitus without complication (HCC)   . MI (myocardial infarction) (HCC)     Medications:  (Not in a hospital admission)   Assessment: 73 YOM who presents to the ED with dyspnea found to have CT evidence of PE with right heart strain. Pharmacy consulted to start IV heparin. Hgb mildly low, Plt wnl. SCr elevated at 1.63  Goal of Therapy:  Heparin level 0.3-0.7 units/ml Monitor platelets by anticoagulation protocol: Yes   Plan:  -Heparin level supra-therapeutic at 0.72  - Decrease heparin drip to 1150 units/hr  -F/u 8 hr HL -Monitor daily HL, CBC and s/s of bleeding   Joaquim Lai, PharmD., BCPS Clinical Pharmacist Clinical phone for 10/22/19 until 3:30pm: [phone] If after 3:30pm, please refer to [facility] for unit-specific pharmacist

## 2019-10-22 NOTE — H&P (Addendum)
History and Physical    Grant Sparks UJW:[id number] DOB: 03-04-1945 DOA: 10/21/2019  Referring MD/NP/PA: Melene Planan Floyd, MD PCP: Kandyce RudBabaoff, Marcus, MD  Patient coming from: Home  Chief Complaint: Fall  I have personally briefly reviewed patient's old medical records in [facility]   HPI: Grant Sparks is a 74 y.o. male with medical history significant of CAD status post stenting, essential hypertension, DM type II, and CVA with residual left-sided weakness presents after having a fall at home in his garage yesterday morning.  Denies any loss of consciousness, but over the last 3 to 4 days had been having complaints of shortness of breath even with light activity.  Noted associated symptoms of pleuritic chest pain, non productive cough, left leg swelling, decreased appetite, weakness, and generalized malaise.  Family history is significant for a younger brother dying of blood clot at the age of 74.  Patient denies any personal history of blood clots for in the past.  He and his wife had traveled across country to [place] back in July sightseeing along the way.   ED Course: Upon admission into the emergency department she was seen to be afebrile, with pulse was mildly elevated up to 101, respirations 16-25, blood pressures maintained, and O2 saturation 93 - 98% on room air.  Labs significant for BUN 24, creatinine 1.63, and high-sensitivity troponin 72-> 67.  CTA of the chest revealed signs of bilateral central pulmonary emboli with right heart strain.  Patient had initially received full dose aspirin and thereafter was started on heparin drip after found to have signs of a pulmonary embolus.  PCCM has been formally consulted due to severity of symptoms.  TRH called to admit to progressive bed.  Review of Systems  Constitutional: Positive for malaise/fatigue. Negative for fever.  HENT: Negative for congestion and nosebleeds.   Eyes: Negative for photophobia and pain.  Respiratory: Positive for  cough and shortness of breath.   Cardiovascular: Positive for chest pain and leg swelling.  Gastrointestinal: Negative for abdominal pain, diarrhea, nausea and vomiting.  Genitourinary: Negative for dysuria and hematuria.  Musculoskeletal: Positive for falls.  Skin: Negative for rash.  Neurological: Negative for focal weakness and loss of consciousness.  Endo/Heme/Allergies: Does not bruise/bleed easily.  Psychiatric/Behavioral: Negative for memory loss and substance abuse.    Past Medical History:  Diagnosis Date  . CAD (coronary artery disease)    S/P stenting of the RCA x2 and PTCA of a PDA lesion in Jan 2004. Pt had a myoracidal infarction when he was in rehabilitation following a CVA.  [name]. CVA (cerebral vascular accident) (HCC)    Right  CVA, left hemiparesis  . Diabetes mellitus without complication (HCC)   . MI (myocardial infarction) [facility](HCC)     Past Surgical History:  Procedure Laterality Date  . APPENDECTOMY    . CATARACT EXTRACTION    . CERVICAL SPINE SURGERY    . coronary artery stent placement     2004     reports that he has never smoked. He has never used smokeless tobacco. He reports that he does not drink alcohol and does not use drugs.  Allergies  Allergen Reactions  . Ambien [Zolpidem] Other (See Comments)    Hallucinate/manic  . Tadalafil Other (See Comments)    hallucinations    Family History  Problem Relation Age of Onset  . Heart attack Father [id number]       MI  . Diabetes Father   . Heart attack Brother 21  MI  (might have been a PE)  . Diabetes Brother   . Heart attack Mother 6  . Diabetes Mother   . Diabetes Sister     Prior to Admission medications   Medication Sig Start Date End Date Taking? Authorizing Provider  amLODipine (NORVASC) 5 MG tablet Take 5 mg by mouth daily. 02/10/18 02/10/19  [provider]  amoxicillin-clavulanate (AUGMENTIN) 875-125 MG tablet Take 1 tablet by mouth every 12 (twelve) hours. 02/25/18   Little,  Ambrose Finland, MD  aspirin EC 325 MG tablet Take 1 tablet (325 mg total) by mouth daily. Patient taking differently: Take 162 mg by mouth 2 (two) times daily.  12/03/16   Micki Riley, MD  atorvastatin (LIPITOR) 40 MG tablet Take 40 mg by mouth daily.  12/09/17   [provider]  glipiZIDE (GLUCOTROL XL) 5 MG 24 hr tablet Take 5 mg by mouth every morning.  07/04/15   [provider]  hydrochlorothiazide (MICROZIDE) 12.5 MG capsule Take 12.5 mg by mouth daily.    [provider]  insulin detemir (LEVEMIR FLEXPEN) 100 UNIT/ML injection Inject 30 Units into the skin 2 (two) times daily.     [provider]  metFORMIN (GLUCOPHAGE) 1000 MG tablet Take 1,000 mg by mouth 2 (two) times daily.     [provider]  Methylcellulose, Laxative, (CITRUCEL) 500 MG TABS Take 500 mg by mouth at bedtime.    [provider]  metoprolol (LOPRESSOR) 50 MG tablet Take 50 mg by mouth 2 (two) times daily.      [provider]  naproxen sodium (ALEVE) 220 MG tablet Take 440 mg by mouth as needed (pain).    [provider]  nitroGLYCERIN (NITROSTAT) 0.4 MG SL tablet Place 1 tablet (0.4 mg total) under the tongue as needed. 07/19/15   [name], MD  OVER THE COUNTER MEDICATION Take 1 tablet by mouth at bedtime. Colon Health 4-in 1    [provider]  predniSONE (DELTASONE) 20 MG tablet Take 3 tablets (60 mg total) by mouth daily. Patient taking differently: Take 40 mg by mouth daily. 40 mg for three days  20 mg for three days 10 mg for three days 02/12/18   Sabas Sous, MD  ramipril (ALTACE) 5 MG capsule Take 5 mg by mouth daily.    [provider]    Physical Exam:  Constitutional: NAD, calm, comfortable Vitals:   10/22/19 0249 10/22/19 0529 10/22/19 0821 10/22/19 0830  BP: (!) 146/65 (!) 114/103 (!) 150/70 (!) 144/65  Pulse: 81 80 (!) 101 100  Resp: 17 18 (!) 25 (!) 22  Temp: 99 F (37.2 C) 98.4 F (36.9 C)      TempSrc: Oral Oral    SpO2: 93% 97% 94% 93%  Weight:      Height:       Eyes: PERRL, lids and conjunctivae normal ENMT: Mucous membranes are moist. Posterior pharynx clear of any exudate or lesions.Normal dentition.  Neck: normal, supple, no masses, no thyromegaly Respiratory: clear to auscultation bilaterally, no wheezing, no crackles. Normal respiratory effort. No accessory muscle use.  Cardiovascular: Regular rate and rhythm, no murmurs / rubs / gallops. No extremity edema. 2+ pedal pulses. No carotid bruits.  Abdomen: no tenderness, no masses palpated. No hepatosplenomegaly. Bowel sounds positive.  Musculoskeletal: no clubbing / cyanosis. No joint deformity upper and lower extremities. Good ROM, no contractures. Normal muscle tone.  Skin: no rashes, lesions, ulcers. No induration Neurologic: CN 2-12 grossly intact.  Sensation intact, DTR normal. Strength 5/5 in all 4.  Psychiatric: Normal judgment and insight. Alert and oriented x 3. Normal mood.     Labs on Admission: I have personally reviewed following labs and imaging studies  CBC: Recent Labs  Lab 10/21/19 1741  WBC 8.2  HGB 12.9*  HCT 39.7  MCV 92.3  PLT 168   Basic Metabolic Panel: Recent Labs  Lab 10/21/19 1741  NA 138  K 4.1  CL 102  CO2 25  GLUCOSE 105*  BUN 24*  CREATININE 1.63*  CALCIUM 9.1   GFR: Estimated Creatinine Clearance: 42.1 mL/min (A) (by C-G formula based on SCr of 1.63 mg/dL (H)). Liver Function Tests: No results for input(s): AST, ALT, ALKPHOS, BILITOT, PROT, ALBUMIN in the last 168 hours. No results for input(s): LIPASE, AMYLASE in the last 168 hours. No results for input(s): AMMONIA in the last 168 hours. Coagulation Profile: No results for input(s): INR, PROTIME in the last 168 hours. Cardiac Enzymes: No results for input(s): CKTOTAL, CKMB, CKMBINDEX, TROPONINI in the last 168 hours. BNP (last 3 results) No results for input(s): PROBNP in the last 8760 hours. HbA1C: No results  for input(s): HGBA1C in the last 72 hours. CBG: No results for input(s): GLUCAP in the last 168 hours. Lipid Profile: No results for input(s): CHOL, HDL, LDLCALC, TRIG, CHOLHDL, LDLDIRECT in the last 72 hours. Thyroid Function Tests: No results for input(s): TSH, T4TOTAL, FREET4, T3FREE, THYROIDAB in the last 72 hours. Anemia Panel: No results for input(s): VITAMINB12, FOLATE, FERRITIN, TIBC, IRON, RETICCTPCT in the last 72 hours. Urine analysis:    Component Value Date/Time   COLORURINE YELLOW 10/13/2016 2229   APPEARANCEUR CLEAR 10/13/2016 2229   LABSPEC 1.027 10/13/2016 2229   PHURINE 5.0 10/13/2016 2229   GLUCOSEU 50 (A) 10/13/2016 2229   HGBUR NEGATIVE 10/13/2016 2229   BILIRUBINUR NEGATIVE 10/13/2016 2229   KETONESUR NEGATIVE 10/13/2016 2229   PROTEINUR NEGATIVE 10/13/2016 2229   NITRITE NEGATIVE 10/13/2016 2229   LEUKOCYTESUR NEGATIVE 10/13/2016 2229   Sepsis Labs: No results found for this or any previous visit (from the past 240 hour(s)).   Radiological Exams on Admission: DG Chest 2 View  Result Date: 10/21/2019 CLINICAL DATA:  Chest pain. Shortness of breath. Symptoms for 2-3 days, progressive. EXAM: CHEST - 2 VIEW COMPARISON:  Radiograph 04/17/2016 FINDINGS: Heart is normal in size. Normal mediastinal contours. Aortic atherosclerosis. No pulmonary edema. Minor subsegmental atelectasis at the lung bases. No confluent airspace disease. No pneumothorax or pleural effusion. Surgical hardware in the lower cervical spine. No acute osseous abnormalities are seen. IMPRESSION: Minor bibasilar atelectasis. Aortic Atherosclerosis (ICD10-I70.0). Electronically Signed   By: Narda Rutherford M.D.   On: 10/21/2019 18:49   CT Angio Chest PE W and/or Wo Contrast  Result Date: 10/22/2019 CLINICAL DATA:  Shortness of breath and chest pain. EXAM: CT ANGIOGRAPHY CHEST WITH CONTRAST TECHNIQUE: Multidetector CT imaging of the chest was performed using the standard protocol during bolus  administration of intravenous contrast. Multiplanar CT image reconstructions and MIPs were obtained to evaluate the vascular anatomy. CONTRAST:  55mL OMNIPAQUE IOHEXOL 350 MG/ML SOLN COMPARISON:  None. FINDINGS: Cardiovascular: The heart size is mildly enlarged. Aortic atherosclerosis identified. Multi vessel coronary artery atherosclerotic calcifications. Large bilateral central pulmonary artery filling defects are identified. Filling defects extend into the lobar and segmental pulmonary arteries bilaterally. The RV to LV ratio is equal to 0.94. Mediastinum/Nodes: No enlarged mediastinal, hilar, or axillary lymph nodes. Thyroid gland, trachea, and esophagus demonstrate no significant findings.  Lungs/Pleura: No pleural effusion identified. No airspace consolidation, atelectasis or pneumothorax identified. Increase interstitial markings are identified within the lower lung zones peripherally. No suspicious pulmonary nodule or mass identified. Focal area of pleural calcification is identified overlying the right upper lobe, image 32/5. Upper Abdomen: No acute abnormality. Musculoskeletal: Mild multilevel degenerative disc disease is noted. Previous ACDF of the cervical spine. Review of the MIP images confirms the above findings. IMPRESSION: 1. Positive for acute PE with CT evidence of right heart strain (RV/LV Ratio = 0.94) consistent with at least submassive (intermediate risk) PE. The presence of right heart strain has been associated with an increased risk of morbidity and mortality. 2. Coronary artery calcifications noted. 3. Aortic atherosclerosis. Aortic Atherosclerosis (ICD10-I70.0). Critical Value/emergent results were called by telephone at the time of interpretation on 10/22/2019 at 8:01 am to provider DAN FLOYD , who verbally acknowledged these results. Electronically Signed   By: Signa Kell M.D.   On: 10/22/2019 08:02    EKG: Independently reviewed.  EKG showed normal sinus rhythm at 73 bpm with left  axis deviation and right bundle branch block.  Assessment/Plan Bilateral pulmonary embolus/DVT: Acute.  Patient presented with complaints of dyspnea on exertion.  CTA of the chest significant for acute PE with evidence of right heart strain with RV/LV ratio equaling 0.94.  PCCM was formally consulted.  Risk factors include recent travel cross-country to the surgery, but patient also reported family history of a younger brother dying of blood clot at the age of 46.  -Admit to a progressive bed -Strict bedrest for at least 24 hours -Heparin drip per pharmacy -Follow-up echocardiogram -Check Doppler ultrasound of the lower extremities (revealed acute DVT of the left peroneal vein and posterior tibial vein) -Transition to oral anticoagulation likely tomorrow -Appreciate PCCM consultative services, will follow-up for further recommendation  -May warrant further hypercoagulable work-up in outpatient setting   Acute kidney injury: On admission creatinine elevated up to 1.63 with BUN 24.  Baseline creatinine previously noted to be around 1.  He notes that he has had poor appetite and had generalized weakness over the last few days.  -Check urinalysis -Normal saline IV fluids at 75 mL/h x 1 L -Hold nephrotoxic agent -Recheck kidney function in a.m  Fall : Patient fell reportedly yesterday in the garage.  Denied any reports of loss of consciousness, but this could have been secondary to the blood clot dislodging in his leg. -Would benefit from PT consult when safe for patient to get up and try to ambulate   Normocytic anemia: Chronic.  Hemoglobin 12.9 g/dL on admission.  Patient denies any reports of bleeding. -Recheck CBC in a.m.  Diabetes mellitus type 2: On admission glucose initially noted to be 105.  No recent hemoglobin A1c available. 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    DVT prophylaxis: Heparin Code Status: Full Family Communication: Case discussed with patient's wife and son-in-law at bedside Disposition Plan: Likely discharge home once able to switch to oral anticoagulant Consults called: PCCM Admission status: Inpatient  Clydie Braun MD [facility] Pager [phone]   If 7PM-7AM, please contact night-coverage [email or web address] Password [email or web address]  10/22/2019, 9:01 AM

## 2019-10-22 NOTE — ED Provider Notes (Signed)
I received the patient in signout from Dr. Ilda Basset.  Briefly the patient is a 74 year old male with a chief complaints of chest pain and severe fatigue.  The patient has been short of breath even tying his shoes.  Plan for CT angiogram of the chest prior to admission.  CT scan of the chest consistent with bilateral pulmonary embolus with right heart strain.  I discussed the case with critical care who recommended medical admission to the progressive care unit.  Critical care to consult.  Recommended heparin.  CRITICAL CARE Performed by: Rae Roam   Total critical care time: 35 minutes  Critical care time was exclusive of separately billable procedures and treating other patients.  Critical care was necessary to treat or prevent imminent or life-threatening deterioration.  Critical care was time spent personally by me on the following activities: development of treatment plan with patient and/or surrogate as well as nursing, discussions with consultants, evaluation of patient's response to treatment, examination of patient, obtaining history from patient or surrogate, ordering and performing treatments and interventions, ordering and review of laboratory studies, ordering and review of radiographic studies, pulse oximetry and re-evaluation of patient's condition.    Melene Plan, DO 10/22/19 1226

## 2019-10-23 LAB — HEPARIN LEVEL (UNFRACTIONATED): Heparin Unfractionated: 0.59 IU/mL (ref 0.30–0.70)

## 2019-10-23 LAB — BASIC METABOLIC PANEL
Anion gap: 12 (ref 5–15)
BUN: 18 mg/dL (ref 8–23)
CO2: 21 mmol/L — ABNORMAL LOW (ref 22–32)
Calcium: 8.6 mg/dL — ABNORMAL LOW (ref 8.9–10.3)
Chloride: 102 mmol/L (ref 98–111)
Creatinine, Ser: 1.22 mg/dL (ref 0.61–1.24)
GFR calc Af Amer: >60 mL/min (ref >60)
GFR calc non Af Amer: 58 mL/min — ABNORMAL LOW (ref >60)
Glucose, Bld: 229 mg/dL — ABNORMAL HIGH (ref 70–99)
Potassium: 4.1 mmol/L (ref 3.5–5.1)
Sodium: 135 mmol/L (ref 135–145)

## 2019-10-23 LAB — CBC
HCT: 36.2 % — ABNORMAL LOW (ref 39.0–52.0)
Hemoglobin: 12.3 g/dL — ABNORMAL LOW (ref 13.0–17.0)
MCH: 30.8 pg (ref 26.0–34.0)
MCHC: 34 g/dL (ref 30.0–36.0)
MCV: 90.5 fL (ref 80.0–100.0)
Platelets: 156 10*3/uL (ref 150–400)
RBC: 4 MIL/uL — ABNORMAL LOW (ref 4.22–5.81)
RDW: 12.8 % (ref 11.5–15.5)
WBC: 7.3 10*3/uL (ref 4.0–10.5)
nRBC: 0 % (ref 0.0–0.2)

## 2019-10-23 LAB — GLUCOSE, CAPILLARY
Glucose-Capillary: 213 mg/dL — ABNORMAL HIGH (ref 70–99)
Glucose-Capillary: 292 mg/dL — ABNORMAL HIGH (ref 70–99)
Glucose-Capillary: 314 mg/dL — ABNORMAL HIGH (ref 70–99)
Glucose-Capillary: 291 mg/dL — ABNORMAL HIGH (ref 70–99)

## 2019-10-23 MED ORDER — INSULIN DETEMIR 100 UNIT/ML ~~LOC~~ SOLN
20.0000 [IU] | Freq: Two times a day (BID) | SUBCUTANEOUS | Status: DC
  Administered 2019-10-23 – 2019-10-24 (×3): 20 [IU] via SUBCUTANEOUS
  Filled 2019-10-23 (×4): qty 0.2

## 2019-10-23 NOTE — Evaluation (Signed)
Physical Therapy Evaluation Patient Details Name: Grant Sparks MRN: [id number] DOB: Mar 08, 1945 Today's Date: 10/23/2019   History of Present Illness  Patient is a 74 y/o male who presents with SOB, chest pain and fall. Found to have acute bilateral PEs with right heart strain and acute DVT LLE. PMH includes CVA. MI, CAD, DM.  Clinical Impression  Patient presents with generalized weakness, dyspnea on exertion, decreased activity tolerance, impaired balance and impaired mobility s/p above. Pt lives with wife and uses quad cane and wife support for ambulation. Wife does IADLs. Reports 1 fall leading to admission. Today, pt requires Mod A to stand from EOB and MIn guard-Min A for gait training with use of RW for support. 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 2-3/4 DOE noted with ambulation with Sp02 >91% on RA. Education re: short bouts of activity with longer rest breaks. Will follow acutely to maximize independence and mobility prior to return home.    Follow Up Recommendations Home health PT;Supervision for mobility/OOB    Equipment Recommendations  None recommended by PT    Recommendations for Other Services       Precautions / Restrictions Precautions Precautions: Fall Precaution Comments: watch 02 Restrictions Weight Bearing Restrictions: No      Mobility  Bed Mobility Overal bed mobility: Needs Assistance Bed Mobility: Supine to Sit;Sit to Supine     Supine to sit: Supervision;HOB elevated Sit to supine: Supervision;HOB elevated   General bed mobility comments: USe of rail, no assist needed.  Transfers Overall transfer level: Needs assistance Equipment used: Rolling walker (2 wheeled) Transfers: Sit to/from Stand Sit to Stand: Mod assist         General transfer comment: Assist to power to standing with difficulty boosting hips off bed. No dizziness.  Ambulation/Gait Ambulation/Gait assistance: Min guard Gait Distance (Feet): 80  Feet Assistive device: Rolling walker (2 wheeled) Gait Pattern/deviations: Step-through pattern;Decreased dorsiflexion - left;Trunk flexed;Decreased stride length Gait velocity: decreased Gait velocity interpretation: <1.31 ft/sec, indicative of household ambulator General Gait Details: Slow, mildly unsteady gait with foot drop LLE (declined wearing AFO), cues for RW proximity, management. 2-3/4 DOE. Sp02 remained >91% on RA.  Stairs            Wheelchair Mobility    Modified Rankin (Stroke Patients Only)       Balance Overall balance assessment: Needs assistance;History of Falls Sitting-balance support: Feet supported;No upper extremity supported Sitting balance-Leahy Scale: Good     Standing balance support: During functional activity Standing balance-Leahy Scale: Poor Standing balance comment: Requires UE support in standing.                             Pertinent Vitals/Pain Pain Assessment: No/denies pain    Home Living Family/patient expects to be discharged to:: Private residence Living Arrangements: Spouse/significant other Available Help at Discharge: Family;Available 24 hours/day Type of Home: House Home Access: Stairs to enter Entrance Stairs-Rails: Right Entrance Stairs-Number of Steps: 3-4 Home Layout: Two level Home Equipment: Walker - 2 wheels;Cane - quad;Shower seat - built in      Prior Function Level of Independence: Independent with assistive device(s)         Comments: Uses quad cane for ambulation. Does not do cooking/cleaning. Can drive, but does not. Reports 1 fall leading to admission. MOstly uses cane and holds onto wife for support during ambulation.     Hand Dominance   Dominant Hand: Right  Extremity/Trunk Assessment   Upper Extremity Assessment Upper Extremity Assessment: Defer to OT evaluation    Lower Extremity Assessment Lower Extremity Assessment: LLE deficits/detail;Generalized weakness LLE Deficits /  Details: Foot drop- premorbid. Wears AFO.       Communication   Communication: No difficulties  Cognition Arousal/Alertness: Awake/alert Behavior During Therapy: WFL for tasks assessed/performed Overall Cognitive Status: History of cognitive impairments - at baseline                                 General Comments: Wife correcting some things pt is saying. Sense of humor. Follows commands well.      General Comments General comments (skin integrity, edema, etc.): SP02 remained >91% on RA with activity.    Exercises     Assessment/Plan    PT Assessment Patient needs continued PT services  PT Problem List Decreased strength;Decreased mobility;Decreased balance;Decreased knowledge of use of DME;Decreased activity tolerance;Cardiopulmonary status limiting activity;Decreased cognition       PT Treatment Interventions Therapeutic activities;Gait training;Therapeutic exercise;Patient/family education;Balance training;Stair training;Functional mobility training    PT Goals (Current goals can be found in the Care Plan section)  Acute Rehab PT Goals Patient Stated Goal: to get home PT Goal Formulation: With patient Time For Goal Achievement: 11/06/19 Potential to Achieve Goals: Good    Frequency Min 3X/week   Barriers to discharge        Co-evaluation               AM-PAC PT "6 Clicks" Mobility  Outcome Measure Help needed turning from your back to your side while in a flat bed without using bedrails?: None Help needed moving from lying on your back to sitting on the side of a flat bed without using bedrails?: A Little Help needed moving to and from a bed to a chair (including a wheelchair)?: A Little Help needed standing up from a chair using your arms (e.g., wheelchair or bedside chair)?: A Lot Help needed to walk in hospital room?: A Little Help needed climbing 3-5 steps with a railing? : A Little 6 Click Score: 18    End of Session Equipment  Utilized During Treatment: Gait belt Activity Tolerance: Patient tolerated treatment well;Patient limited by fatigue Patient left: in bed;with call bell/phone within reach;with bed alarm set;with family/visitor present Nurse Communication: Mobility status PT Visit Diagnosis: Difficulty in walking, not elsewhere classified (R26.2);Unsteadiness on feet (R26.81)    Time: 8341-9622 PT Time Calculation (min) (ACUTE ONLY): 22 min   Charges:   PT Evaluation $PT Eval Moderate Complexity: 1 Mod          Vale Haven, PT, DPT Acute Rehabilitation Services Pager [phone] Office [phone]      Blake Divine A Lanier Ensign 10/23/2019, 3:03 PM

## 2019-10-23 NOTE — Progress Notes (Signed)
ANTICOAGULATION CONSULT NOTE   Pharmacy Consult for heparin Indication: pulmonary embolus  Allergies  Allergen Reactions   Ambien [Zolpidem] Other (See Comments)    Hallucinations/manic   Tadalafil Other (See Comments)    Hallucinations     Patient Measurements: Height: 6' (182.9 cm) Weight: 79.3 kg (174 lb 13.2 oz) IBW/kg (Calculated) : 77.6 Heparin Dosing Weight: 74.8 kg   Vital Signs: Temp: 98.7 F (37.1 C) (08/27 0407) Temp Source: Oral (08/27 0407) BP: 145/75 (08/27 0407) Pulse Rate: 89 (08/27 0407)  Labs: Recent Labs    10/21/19 1741 10/21/19 2021 10/22/19 1640 10/23/19 0236  HGB 12.9*  --   --  12.3*  HCT 39.7  --   --  36.2*  PLT 168  --   --  156  HEPARINUNFRC  --   --  0.72* 0.59  CREATININE 1.63*  --   --  1.22  TROPONINIHS 72* 67*  --   --     Estimated Creatinine Clearance: 58.3 mL/min (by C-G formula based on SCr of 1.22 mg/dL).   Assessment: 54 YOM who presents to the ED with dyspnea found to have CT evidence of PE with right heart strain. Pharmacy consulted to start IV heparin. Hgb mildly low, Plt wnl. SCr elevated at 1.63  Heparin level therapeutic (0.59) on gtt at 1150 units/hr. No bleeding noted.  Goal of Therapy:  Heparin level 0.3-0.7 units/ml Monitor platelets by anticoagulation protocol: Yes   Plan:  Continue heparin drip at 1150 units/hr  F/u daily heparin level  Christoper Fabian, PharmD, BCPS Please see amion for complete clinical pharmacist phone list 10/23/2019 4:48 AM

## 2019-10-23 NOTE — Progress Notes (Signed)
PROGRESS NOTE    Grant Sparks  NWG:[id number] DOB: 1945-09-15 DOA: 10/21/2019 PCP: Kandyce Rud, MD   Chief Complaint  Patient presents with  . Shortness of Breath  . Chest Pain  . Fall    Brief Narrative:  Grant Sparks is Grant Sparks 74 y.o. male with medical history significant of CAD status post stenting, essential hypertension, DM type II, and CVA with residual left-sided weakness presents after having Ad Guttman fall at home in his garage yesterday morning.  Denies any loss of consciousness, but over the last 3 to 4 days had been having complaints of shortness of breath even with light activity.  Noted associated symptoms of pleuritic chest pain, non productive cough, left leg swelling, decreased appetite, weakness, and generalized malaise.  Family history is significant for Jerame Hedding younger brother dying of blood clot at the age of 82.  Patient denies any personal history of blood clots for in the past.  He and his wife had traveled across country to [place] back in July sightseeing along the way.   ED Course: Upon admission into the emergency department she was seen to be afebrile, with pulse was mildly elevated up to 101, respirations 16-25, blood pressures maintained, and O2 saturation 93 - 98% on room air.  Labs significant for BUN 24, creatinine 1.63, and high-sensitivity troponin 72-> 67.  CTA of the chest revealed signs of bilateral central pulmonary emboli with right heart strain.  Patient had initially received full dose aspirin and thereafter was started on heparin drip after found to have signs of Marieli Rudy pulmonary embolus.  PCCM has been formally consulted due to severity of symptoms.  TRH called to admit to progressive bed.  Assessment & Plan:   Principal Problem:   Bilateral pulmonary embolism (HCC) Active Problems:   Normocytic anemia   History of CVA with residual deficit   Left leg DVT (HCC)   AKI (acute kidney injury) (HCC)   Fall at home, initial encounter  Submassive Pulmonary  Embolism  Bilateral pulmonary embolus  DVT:  Suspect this is provoked in setting of recent travel in July, he notes he's been SOB with exertion for 3 weeks with LE swelling as well.  - CT PE with PE with evidence of right heart strain - echo with normal RV systolic function, normal RV size (see report) - LE [place] with acute DVT of L peroneal vein and posterior tibial vein  - PESI 84 (class II, low risk) -> 1.7-3.5% 30 day mortality - given submassive nature of PE with RH strain on imaging, will plan for 48 hrs of heparin prior to transitioning to oral DOAC - appreciate critical care c/s - no indication for lytics or catheter based therapy - he'll need at least 3 months anticoagulation and then discussion of additional anticoagulation based on risks benefits - sounds like he may travel frequently, could consider continued therapy based on risk/benefit discussion with this - recommended updated routine cancer screening   Acute kidney injury: On admission creatinine elevated up to 1.63 with BUN 24.  Baseline creatinine previously noted to be around 1. - Improved with IVF  Fall: follow with PT, ? Related to dyspnea on exertion from PE  Normocytic anemia: Chronic.  Continue to monitor, relatively stable  Diabetes mellitus type 2: A1c 8.4.   - hold oral agents - continue reduced dose levemir  History of CVA with residual deficit: Patient with prior history of CVA resulting in left-sided weakness and foot drop for which patient uses Mercer Stallworth prosthesis  to ambulate.  DVT prophylaxis: heparin gtt Code Status: full  Family Communication: wife at bedside Disposition:   Status is: Inpatient  Remains inpatient appropriate because:Inpatient level of care appropriate due to severity of illness   Dispo: The patient is from: Home              Anticipated d/c is to: Home              Anticipated d/c date is: 2 days              Patient currently is not medically stable to d/c.  Consultants:    PCCM  Procedures:  Echo IMPRESSIONS    1. Left ventricular ejection fraction, by estimation, is 70 to 75%. The  left ventricle has hyperdynamic function. The left ventricle has no  regional wall motion abnormalities. There is moderate left ventricular  hypertrophy. Indeterminate diastolic  filling due to E-Alanta Scobey fusion.  2. Right ventricular systolic function is normal. The right ventricular  size is normal. Tricuspid regurgitation signal is inadequate for assessing  PA pressure.  3. The mitral valve is normal in structure. No evidence of mitral valve  regurgitation. No evidence of mitral stenosis.  4. The aortic valve is tricuspid. Aortic valve regurgitation is not  visualized. No aortic stenosis is present.  5. The inferior vena cava is normal in size with greater than 50%  respiratory variability, suggesting right atrial pressure of 3 mmHg.   LE [place] Summary:  RIGHT:  - There is no evidence of deep vein thrombosis in the lower extremity.    - No cystic structure found in the popliteal fossa.    LEFT:  - Findings consistent with acute deep vein thrombosis involving the left  popliteal vein, and left posterior tibial veins.  - No cystic structure found in the popliteal fossa.   Antimicrobials:  Anti-infectives (From admission, onward)   None     Subjective: No new complaints today C/o SOB on exertion for 3 weeks LE edema started after his trip, some before, more intermittent  Objective: Vitals:   10/23/19 0200 10/23/19 0407 10/23/19 0741 10/23/19 1120  BP: (!) 146/59 (!) 145/75 (!) 161/76 (!) 154/79  Pulse: 98 89 98 88  Resp: 16 20 20  (!) 21  Temp:  98.7 F (37.1 C) 98.1 F (36.7 C) 98.8 F (37.1 C)  TempSrc:  Oral Oral Oral  SpO2: 91% 94% 92% 91%  Weight:      Height:        Intake/Output Summary (Last 24 hours) at 10/23/2019 1340 Last data filed at 10/23/2019 0849 Gross per 24 hour  Intake 660.55 ml  Output 1350 ml  Net -689.45 ml   Filed  Weights   10/21/19 1739 10/22/19 2037  Weight: 74.8 kg 79.3 kg    Examination:  General exam: Appears calm and comfortable  Respiratory system: unlabored Cardiovascular system: RRR Gastrointestinal system: Abdomen is nondistended, soft and nontender.  Central nervous system: Alert and oriented. No focal neurological deficits. Extremities: mild LLE  Skin: No rashes, lesions or ulcers Psychiatry: Judgement and insight appear normal. Mood & affect appropriate.     Data Reviewed: I have personally reviewed following labs and imaging studies  CBC: Recent Labs  Lab 10/21/19 1741 10/23/19 0236  WBC 8.2 7.3  HGB 12.9* 12.3*  HCT 39.7 36.2*  MCV 92.3 90.5  PLT 168 156    Basic Metabolic Panel: Recent Labs  Lab 10/21/19 1741 10/23/19 0236  NA 138 135  K 4.1  4.1  CL 102 102  CO2 25 21*  GLUCOSE 105* 229*  BUN 24* 18  CREATININE 1.63* 1.22  CALCIUM 9.1 8.6*    GFR: Estimated Creatinine Clearance: 58.3 mL/min (by C-G formula based on SCr of 1.22 mg/dL).  Liver Function Tests: No results for input(s): AST, ALT, ALKPHOS, BILITOT, PROT, ALBUMIN in the last 168 hours.  CBG: Recent Labs  Lab 10/22/19 1814 10/22/19 2044 10/23/19 0606 10/23/19 1118  GLUCAP 254* 257* 213* 292*     Recent Results (from the past 240 hour(s))  SARS Coronavirus 2 by RT PCR (hospital order, [facility]) Nasopharyngeal Nasopharyngeal Swab     Status: None   Collection Time: 10/22/19  6:36 AM   Specimen: Nasopharyngeal Swab  Result Value Ref Range Status   SARS Coronavirus 2 NEGATIVE NEGATIVE Final    Comment: (NOTE) SARS-CoV-2 target nucleic acids are NOT DETECTED.  The SARS-CoV-2 RNA is generally detectable in upper and lower respiratory specimens during the acute phase of infection. The lowest concentration of SARS-CoV-2 viral copies this assay can detect is 250 copies / mL. Shardea Cwynar negative result does not preclude SARS-CoV-2 infection and should not be used as  the sole basis for treatment or other patient management decisions.  Jacque Byron negative result may occur with improper specimen collection / handling, submission of specimen other than nasopharyngeal swab, presence of viral mutation(s) within the areas targeted by this assay, and inadequate number of viral copies (<250 copies / mL). Phelicia Dantes negative result must be combined with clinical observations, patient history, and epidemiological information.  Fact Sheet for Patients:   BoilerBrush.com.cyhttps://www.fda.gov/media/136312/download  Fact Sheet for Healthcare Providers: https://pope.com/https://www.fda.gov/media/136313/download  This test is not yet approved or  cleared by the [place] FDA and has been authorized for detection and/or diagnosis of SARS-CoV-2 by FDA under an Emergency Use Authorization (EUA).  This EUA will remain in effect (meaning this test can be used) for the duration of the COVID-19 declaration under Section 564(b)(1) of the Act, 21 U.S.C. section 360bbb-3(b)(1), unless the authorization is terminated or revoked sooner.  Performed at [facility], [address] [place], [place] [address]          Radiology Studies: DG Chest 2 View  Result Date: 10/21/2019 CLINICAL DATA:  Chest pain. Shortness of breath. Symptoms for 2-3 days, progressive. EXAM: CHEST - 2 VIEW COMPARISON:  Radiograph 04/17/2016 FINDINGS: Heart is normal in size. Normal mediastinal contours. Aortic atherosclerosis. No pulmonary edema. Minor subsegmental atelectasis at the lung bases. No confluent airspace disease. No pneumothorax or pleural effusion. Surgical hardware in the lower cervical spine. No acute osseous abnormalities are seen. IMPRESSION: Minor bibasilar atelectasis. Aortic Atherosclerosis (ICD10-I70.0). Electronically Signed   By: Narda RutherfordMelanie  Sanford M.D.   On: 10/21/2019 18:49   CT Angio Chest PE W and/or Wo Contrast  Result Date: 10/22/2019 CLINICAL DATA:  Shortness of breath and chest pain. EXAM: CT ANGIOGRAPHY  CHEST WITH CONTRAST TECHNIQUE: Multidetector CT imaging of the chest was performed using the standard protocol during bolus administration of intravenous contrast. Multiplanar CT image reconstructions and MIPs were obtained to evaluate the vascular anatomy. CONTRAST:  60mL OMNIPAQUE IOHEXOL 350 MG/ML SOLN COMPARISON:  None. FINDINGS: Cardiovascular: The heart size is mildly enlarged. Aortic atherosclerosis identified. Multi vessel coronary artery atherosclerotic calcifications. Large bilateral central pulmonary artery filling defects are identified. Filling defects extend into the lobar and segmental pulmonary arteries bilaterally. The RV to LV ratio is equal to 0.94. Mediastinum/Nodes: No enlarged mediastinal, hilar, or  axillary lymph nodes. Thyroid gland, trachea, and esophagus demonstrate no significant findings. Lungs/Pleura: No pleural effusion identified. No airspace consolidation, atelectasis or pneumothorax identified. Increase interstitial markings are identified within the lower lung zones peripherally. No suspicious pulmonary nodule or mass identified. Focal area of pleural calcification is identified overlying the right upper lobe, image 32/5. Upper Abdomen: No acute abnormality. Musculoskeletal: Mild multilevel degenerative disc disease is noted. Previous ACDF of the cervical spine. Review of the MIP images confirms the above findings. IMPRESSION: 1. Positive for acute PE with CT evidence of right heart strain (RV/LV Ratio = 0.94) consistent with at least submassive (intermediate risk) PE. The presence of right heart strain has been associated with an increased risk of morbidity and mortality. 2. Coronary artery calcifications noted. 3. Aortic atherosclerosis. Aortic Atherosclerosis (ICD10-I70.0). Critical Value/emergent results were called by telephone at the time of interpretation on 10/22/2019 at 8:01 am to provider DAN FLOYD , who verbally acknowledged these results. Electronically Signed   By:  Signa Kell M.D.   On: 10/22/2019 08:02   ECHOCARDIOGRAM COMPLETE  Result Date: 10/22/2019    ECHOCARDIOGRAM REPORT   Patient Name:   FILIMON MIRANDA Lindvall Date of Exam: 10/22/2019 Medical Rec #:  [id number]     Height:       72.0 in Accession #:    [id number]    Weight:       165.0 lb Date of Birth:  [birth year]     BSA:          1.963 m Patient Age:    74 years      BP:           148/70 mmHg Patient Gender: M             HR:           94 bpm. Exam Location:  Inpatient Procedure: 2D Echo, Cardiac Doppler and Color Doppler STAT ECHO Indications:    I26.02 Pulmonary embolus  History:        Patient has prior history of Echocardiogram examinations, most                 recent 10/15/2016. Previous Myocardial Infarction and CAD,                 Stroke; Risk Factors:Diabetes.  Sonographer:    Tiffany Dance Referring Phys: [id number] DAN FLOYD IMPRESSIONS  1. Left ventricular ejection fraction, by estimation, is 70 to 75%. The left ventricle has hyperdynamic function. The left ventricle has no regional wall motion abnormalities. There is moderate left ventricular hypertrophy. Indeterminate diastolic filling due to E-Jadore Mcguffin fusion.  2. Right ventricular systolic function is normal. The right ventricular size is normal. Tricuspid regurgitation signal is inadequate for assessing PA pressure.  3. The mitral valve is normal in structure. No evidence of mitral valve regurgitation. No evidence of mitral stenosis.  4. The aortic valve is tricuspid. Aortic valve regurgitation is not visualized. No aortic stenosis is present.  5. The inferior vena cava is normal in size with greater than 50% respiratory variability, suggesting right atrial pressure of 3 mmHg. FINDINGS  Left Ventricle: Left ventricular ejection fraction, by estimation, is 70 to 75%. The left ventricle has hyperdynamic function. The left ventricle has no regional wall motion abnormalities. The left ventricular internal cavity size was normal in size. There is moderate left  ventricular hypertrophy. Indeterminate diastolic filling due to E-Dann Ventress fusion. Right Ventricle: The right ventricular size is normal. No increase in right ventricular wall thickness.  Right ventricular systolic function is normal. Tricuspid regurgitation signal is inadequate for assessing PA pressure. Left Atrium: Left atrial size was normal in size. Right Atrium: Right atrial size was normal in size. Pericardium: There is no evidence of pericardial effusion. Mitral Valve: The mitral valve is normal in structure. Normal mobility of the mitral valve leaflets. Mild mitral annular calcification. No evidence of mitral valve regurgitation. No evidence of mitral valve stenosis. Tricuspid Valve: The tricuspid valve is normal in structure. Tricuspid valve regurgitation is trivial. No evidence of tricuspid stenosis. Aortic Valve: The aortic valve is tricuspid. . There is mild thickening and mild calcification of the aortic valve. Aortic valve regurgitation is not visualized. No aortic stenosis is present. There is mild thickening of the aortic valve. There is mild calcification of the aortic valve. Pulmonic Valve: The pulmonic valve was normal in structure. Pulmonic valve regurgitation is trivial. No evidence of pulmonic stenosis. Aorta: The aortic root and ascending aorta are structurally normal, with no evidence of dilitation. Venous: The inferior vena cava is normal in size with greater than 50% respiratory variability, suggesting right atrial pressure of 3 mmHg. IAS/Shunts: No atrial level shunt detected by color flow Doppler.  LEFT VENTRICLE PLAX 2D LVIDd:         4.50 cm  Diastology LVIDs:         3.10 cm  LV e' lateral: 4.79 cm/s LV PW:         1.30 cm  LV e' medial:  4.68 cm/s LV IVS:        1.50 cm LVOT diam:     1.90 cm LV SV:         58 LV SV Index:   30 LVOT Area:     2.84 cm  RIGHT VENTRICLE             IVC RV Basal diam:  2.60 cm     IVC diam: 1.40 cm RV S prime:     14.90 cm/s TAPSE (M-mode): 2.1 cm LEFT ATRIUM              Index       RIGHT ATRIUM           Index LA diam:        3.80 cm 1.94 cm/m  RA Area:     14.40 cm LA Vol (A2C):   59.6 ml 30.36 ml/m RA Volume:   32.40 ml  16.50 ml/m LA Vol (A4C):   48.1 ml 24.50 ml/m LA Biplane Vol: 56.3 ml 28.68 ml/m  AORTIC VALVE LVOT Vmax:   101.00 cm/s LVOT Vmean:  70.200 cm/s LVOT VTI:    0.206 m  AORTA Ao Root diam: 3.60 cm Ao Asc diam:  3.40 cm MV Oluwasemilore Bahl velocity: 95.80 cm/s                            SHUNTS                            Systemic VTI:  0.21 m                            Systemic Diam: 1.90 cm Weston Brass MD Electronically signed by Weston Brass MD Signature Date/Time: 10/22/2019/9:52:00 AM    Final    VAS [place] LOWER EXTREMITY VENOUS (DVT)  Result Date: 10/22/2019  Lower Venous DVTStudy  Indications: Swelling.  Risk Factors: Confirmed PE. Limitations: Poor ultrasound/tissue interface. Comparison Study: No prior studies. Performing Technologist: Jean Rosenthal  Examination Guidelines: Jacyln Carmer complete evaluation includes B-mode imaging, spectral Doppler, color Doppler, and power Doppler as needed of all accessible portions of each vessel. Bilateral testing is considered an integral part of Aliveah Gallant complete examination. Limited examinations for reoccurring indications may be performed as noted. The reflux portion of the exam is performed with the patient in reverse Trendelenburg.  +---------+---------------+---------+-----------+----------+--------------+ RIGHT    CompressibilityPhasicitySpontaneityPropertiesThrombus Aging +---------+---------------+---------+-----------+----------+--------------+ CFV      Full           Yes      Yes                                 +---------+---------------+---------+-----------+----------+--------------+ SFJ      Full                                                        +---------+---------------+---------+-----------+----------+--------------+ FV Prox  Full                                                         +---------+---------------+---------+-----------+----------+--------------+ FV Mid   Full                                                        +---------+---------------+---------+-----------+----------+--------------+ FV DistalFull                                                        +---------+---------------+---------+-----------+----------+--------------+ PFV      Full                                                        +---------+---------------+---------+-----------+----------+--------------+ POP      Full           Yes      Yes                                 +---------+---------------+---------+-----------+----------+--------------+ PTV      Full                                                        +---------+---------------+---------+-----------+----------+--------------+ PERO     Full                                                        +---------+---------------+---------+-----------+----------+--------------+   +---------+---------------+---------+-----------+----------+--------------+  LEFT     CompressibilityPhasicitySpontaneityPropertiesThrombus Aging +---------+---------------+---------+-----------+----------+--------------+ CFV      Full           Yes      Yes                                 +---------+---------------+---------+-----------+----------+--------------+ SFJ      Full                                                        +---------+---------------+---------+-----------+----------+--------------+ FV Prox  Full                                                        +---------+---------------+---------+-----------+----------+--------------+ FV Mid   Full                                                        +---------+---------------+---------+-----------+----------+--------------+ FV DistalFull                                                         +---------+---------------+---------+-----------+----------+--------------+ PFV      Full                                                        +---------+---------------+---------+-----------+----------+--------------+ POP      None           No       No                   Acute          +---------+---------------+---------+-----------+----------+--------------+ PTV      None           No       No                   Acute          +---------+---------------+---------+-----------+----------+--------------+ PERO                                                  Not visualized +---------+---------------+---------+-----------+----------+--------------+     Summary: RIGHT: - There is no evidence of deep vein thrombosis in the lower extremity.  - No cystic structure found in the popliteal fossa.  LEFT: - Findings consistent with acute deep vein thrombosis involving the left popliteal vein, and left posterior tibial veins. - No cystic structure found in the popliteal fossa.  *See table(s) above for measurements and observations. Electronically signed  by Sherald Hess MD on 10/22/2019 at 3:20:26 PM.    Final         Scheduled Meds: . insulin aspart  0-5 Units Subcutaneous QHS  . insulin aspart  0-9 Units Subcutaneous TID WC  . insulin detemir  20 Units Subcutaneous BID  . sodium chloride flush  3 mL Intravenous Q12H   Continuous Infusions: . heparin 1,150 Units/hr (10/23/19 0700)     LOS: 1 day    Time spent: over 30 min    Lacretia Nicks, MD [facility]   To contact the attending provider between 7A-7P or the covering provider during after hours 7P-7A, please log into the web site [email or web address] and access using universal [place] password for that web site. If you do not have the password, please call the hospital operator.  10/23/2019, 1:40 PM

## 2019-10-23 NOTE — Progress Notes (Signed)
Inpatient Diabetes Program Recommendations  AACE/ADA: New Consensus Statement on Inpatient Glycemic Control (2015)  Target Ranges:  Prepandial:   less than 140 mg/dL      Peak postprandial:   less than 180 mg/dL (1-2 hours)      Critically ill patients:  140 - 180 mg/dL   Lab Results  Component Value Date   GLUCAP 292 (H) 10/23/2019   HGBA1C 8.4 (H) 10/22/2019    Review of Glycemic Control Results for Grant Sparks, Grant Sparks (MRN [id number]) as of 10/23/2019 12:53  Ref. Range 10/22/2019 18:14 10/22/2019 20:44 10/23/2019 06:06 10/23/2019 11:18  Glucose-Capillary Latest Ref Range: 70 - 99 mg/dL 916 (H) 606 (H) 004 (H) 292 (H)   Diabetes history:  DM2  Outpatient Diabetes medications:  Levemir 40 units qam & 30 units qpm Glipizide 5 mg daily  Metformin 1000 mg daily  Current orders for Inpatient glycemic control:  Novolog 0-9 units tid  Novolog 0-5 qhs  Inpatient Diabetes Program Recommendations:    Levemir 20 units bid  Will continue to follow while inpatient.  Thank you, Dulce Sellar, RN, BSN Diabetes Coordinator Inpatient Diabetes Program [phone] (team pager from 8a-5p)

## 2019-10-24 LAB — COMPREHENSIVE METABOLIC PANEL
ALT: 17 U/L (ref 0–44)
AST: 17 U/L (ref 15–41)
Albumin: 2.9 g/dL — ABNORMAL LOW (ref 3.5–5.0)
Alkaline Phosphatase: 73 U/L (ref 38–126)
Anion gap: 10 (ref 5–15)
BUN: 17 mg/dL (ref 8–23)
CO2: 22 mmol/L (ref 22–32)
Calcium: 8.7 mg/dL — ABNORMAL LOW (ref 8.9–10.3)
Chloride: 103 mmol/L (ref 98–111)
Creatinine, Ser: 1.18 mg/dL (ref 0.61–1.24)
GFR calc Af Amer: >60 mL/min (ref >60)
GFR calc non Af Amer: >60 mL/min (ref >60)
Glucose, Bld: 196 mg/dL — ABNORMAL HIGH (ref 70–99)
Potassium: 4.3 mmol/L (ref 3.5–5.1)
Sodium: 135 mmol/L (ref 135–145)
Total Bilirubin: 0.6 mg/dL (ref 0.3–1.2)
Total Protein: 5.8 g/dL — ABNORMAL LOW (ref 6.5–8.1)

## 2019-10-24 LAB — CBC
HCT: 35.5 % — ABNORMAL LOW (ref 39.0–52.0)
Hemoglobin: 12 g/dL — ABNORMAL LOW (ref 13.0–17.0)
MCH: 30.6 pg (ref 26.0–34.0)
MCHC: 33.8 g/dL (ref 30.0–36.0)
MCV: 90.6 fL (ref 80.0–100.0)
Platelets: 159 10*3/uL (ref 150–400)
RBC: 3.92 MIL/uL — ABNORMAL LOW (ref 4.22–5.81)
RDW: 12.7 % (ref 11.5–15.5)
WBC: 6.8 10*3/uL (ref 4.0–10.5)
nRBC: 0 % (ref 0.0–0.2)

## 2019-10-24 LAB — MAGNESIUM: Magnesium: 2 mg/dL (ref 1.7–2.4)

## 2019-10-24 LAB — PHOSPHORUS: Phosphorus: 2.8 mg/dL (ref 2.5–4.6)

## 2019-10-24 LAB — GLUCOSE, CAPILLARY
Glucose-Capillary: 157 mg/dL — ABNORMAL HIGH (ref 70–99)
Glucose-Capillary: 204 mg/dL — ABNORMAL HIGH (ref 70–99)

## 2019-10-24 LAB — HEPARIN LEVEL (UNFRACTIONATED): Heparin Unfractionated: 0.28 IU/mL — ABNORMAL LOW (ref 0.30–0.70)

## 2019-10-24 MED ORDER — APIXABAN 5 MG PO TABS: 5.0000 mg | ORAL_TABLET | Freq: Two times a day (BID) | ORAL | 1 refills | Status: DC

## 2019-10-24 MED ORDER — APIXABAN 5 MG PO TABS
10.0000 mg | ORAL_TABLET | Freq: Two times a day (BID) | ORAL | Status: DC
  Administered 2019-10-24: 10 mg via ORAL
  Filled 2019-10-24: qty 2

## 2019-10-24 MED ORDER — APIXABAN 5 MG PO TABS: 5.0000 mg | ORAL_TABLET | Freq: Two times a day (BID) | ORAL | Status: DC

## 2019-10-24 MED ORDER — APIXABAN (ELIQUIS) VTE STARTER PACK (10MG AND 5MG): ORAL_TABLET | ORAL | 0 refills | Status: DC

## 2019-10-24 NOTE — TOC Transition Note (Signed)
Transition of Care [facility]) - CM/SW Discharge Note   Patient Details  Name: Grant Sparks MRN: [id number] Date of Birth: [birth year]  [facility]) CM/SW Contact:  Deveron Furlong, RN 10/24/2019, 3:13 PM   Clinical Narrative:    Discussed HH PT with patient and wife.  They prefer for patient to do outpatient PT.  They state they have done [facility] PT and did not find it helpful. They have had more success with OPPT. Referral sent to OPPT on [facility].  They prefer to drive here over other locations.   Patient given 30 day free Eliquis card.    Final next level of care: OP Rehab Barriers to Discharge: No Barriers Identified   Patient Goals and CMS Choice Patient states their goals for this hospitalization and ongoing recovery are:: to get better CMS Medicare.gov Compare Post Acute Care list provided to:: Patient Choice offered to / list presented to : Patient

## 2019-10-24 NOTE — Discharge Summary (Signed)
Physician Discharge Summary  Grant Sparks XBM:[id number] DOB: [birth year] DOA: 10/21/2019  PCP: Grant Rud, MD  Admit date: 10/21/2019 Discharge date: 10/24/2019  Time spent: 40 minutes  Recommendations for Outpatient Follow-up:  1. Follow outpatient CBC/CMP 2. Needs at least 3 months uninterrupted anticoagulation with eliquis - prior to discontinuing, recommended discussion with PCP regarding risks and benefits of extending anticoagulation - provoking factor was long distance travel in car, sounds like this is something they like to do relatively frequently, could consider prolonged anticoagulation vs follow up with heme for additional w/u and discussion 3. ASA stopped with initiation of anticoagulation   4. Please ensure he's up to date with routine cancer screening  5. Needs outpatient sleep study   Discharge Diagnoses:  Principal Problem:   Bilateral pulmonary embolism (HCC) Active Problems:   Normocytic anemia   History of CVA with residual deficit   Left leg DVT (HCC)   AKI (acute kidney injury) (HCC)   Fall at home, initial encounter   Discharge Condition: stable  Diet recommendation: heart healthy, diabetic  Filed Weights   10/21/19 1739 10/22/19 2037  Weight: 74.8 kg 79.3 kg    History of present illness:  Grant Sparks 74 y.o.malewith medical history significant ofCAD status post stenting,essential hypertension,DM type II,andCVA withresidual left-sided weakness presents after having Grant Sparks fall at home in his garage yesterday morning. Denies any loss of consciousness, but over the last 3 to 4 days had been having complaints of shortness of breath even with light activity. Noted associated symptoms of pleuritic chest pain, non productive cough,left leg swelling, decreased appetite, weakness, and generalized malaise. Family history is significant for Grant Sparks younger brother dying of blood clot at the age of 19. Patient denies any personal history of blood clots  for in the past. He and his wife had traveled across country to [place] back in July sightseeing along the way.   ED Course:Upon admission into the emergency department she was seen to be afebrile, with pulse was mildly elevated up to 101, respirations 16-25, blood pressures maintained, and O2 saturation 93-98% on room air. Labs significant for BUN 24, creatinine 1.63, andhigh-sensitivity troponin 72->67. CTA of the chest revealed signs of bilateral central pulmonary emboli with right heart strain. Patient had initially received full dose aspirin and thereafter was started on heparin drip after found to have signs of Grant Sparks pulmonary embolus. PCCM has been formally consulted due to severity of symptoms. TRH called to admit to progressive bed.  He was seen for Grant Sparks submassive PE.  He's improved with anticoagulation.  Plan for d/c home on eliquis.    Hospital Course:  Submassive Pulmonary Embolism  Bilateral pulmonary embolus  DVT:  Suspect this is provoked in setting of recent travel in July, he notes he's been SOB with exertion for 3 weeks with LE swelling as well. Fam hx of VTE as well. - CT PE with PE with evidence of right heart strain - echo with normal RV systolic function, normal RV size (see report) - LE [place] with acute DVT of L peroneal vein and posterior tibial vein  - PESI 84 (class II, low risk) -> 1.7-3.5% 30 day mortality - given submassive nature of PE with RH strain on imaging, will plan for 48 hrs of heparin prior to transitioning to oral DOAC -> transitioned to eliquis today, recommended 3 months anticoagulation and discussion with PCP prior to d/c this - sounds like they travel relatively frequently, could discuss risk/benefits of prolonged anticoagulation +/- discuss  follow up with heme - appreciate critical care c/s - no indication for lytics or catheter based therapy - he'll need at least 3 months anticoagulation and then discussion of additional anticoagulation based on  risks benefits - sounds like he may travel frequently, could consider continued therapy based on risk/benefit discussion with this - recommended updated routine cancer screening  Acute kidney injury: On admission creatinine elevated up to 1.63 with BUN 24. Baseline creatinine previously noted to be around 1. - Improved with IVF - resumed HCTZ and ace at d/c  Fall: follow with PT, ? Related to dyspnea on exertion from PE.  Home health.   Normocytic anemia: Chronic. Continue to monitor, relatively stable  Diabetes mellitus type 2: A1c 8.4.   - hold oral agents - continue reduced dose levemir  History of CVA with residual deficit: Patient with prior history of CVA resulting in left-sided weakness and foot drop for which patient uses Grant Sparks prosthesis to ambulate.  Concern for sleep apnea: needs outpatient sleep study  Procedures: Echo IMPRESSIONS    1. Left ventricular ejection fraction, by estimation, is 70 to 75%. The  left ventricle has hyperdynamic function. The left ventricle has no  regional wall motion abnormalities. There is moderate left ventricular  hypertrophy. Indeterminate diastolic  filling due to Grant Sparks fusion.  2. Right ventricular systolic function is normal. The right ventricular  size is normal. Tricuspid regurgitation signal is inadequate for assessing  PA pressure.  3. The mitral valve is normal in structure. No evidence of mitral valve  regurgitation. No evidence of mitral stenosis.  4. The aortic valve is tricuspid. Aortic valve regurgitation is not  visualized. No aortic stenosis is present.  5. The inferior vena cava is normal in size with greater than 50%  respiratory variability, suggesting right atrial pressure of 3 mmHg.  LE [place] Summary:  RIGHT:  - There is no evidence of deep vein thrombosis in the lower extremity.    - No cystic structure found in the popliteal fossa.    LEFT:  - Findings consistent with acute deep vein thrombosis  involving the left  popliteal vein, and left posterior tibial veins.  - No cystic structure found in the popliteal fossa.     Consultations:  PCCM  Discharge Exam: Vitals:   10/24/19 0900 10/24/19 1134  BP:  (!) 161/76  Pulse: 85 76  Resp: 16 (!) 21  Temp:  98.5 F (36.9 C)  SpO2: 98% 94%   No new complaints Wife at bedside  General: No acute distress. Cardiovascular:RRR. Lungs: unlabored Abdomen: Soft, nontender, nondistended Neurological: Alert and oriented 3. Moves all extremities 4. Cranial nerves II through XII grossly intact. Skin: Warm and dry. No rashes or lesions. Extremities: mild LLE edema  Discharge Instructions   Discharge Instructions    Call MD for:  difficulty breathing, headache or visual disturbances   Complete by: As directed    Call MD for:  extreme fatigue   Complete by: As directed    Call MD for:  hives   Complete by: As directed    Call MD for:  persistant dizziness or light-headedness   Complete by: As directed    Call MD for:  persistant nausea and vomiting   Complete by: As directed    Call MD for:  redness, tenderness, or signs of infection (pain, swelling, redness, odor or green/yellow discharge around incision site)   Complete by: As directed    Call MD for:  severe uncontrolled pain  Complete by: As directed    Call MD for:  temperature >100.4   Complete by: As directed    Diet - low sodium heart healthy   Complete by: As directed    Discharge instructions   Complete by: As directed    You were seen for Grant Sparks pulmonary embolism.    You have been started on eliquis.  We think your blood clots were due to your long driving trip.  You need at least 3 months of uninterrupted anticoagulation.  I'll give you Khanh Tanori paper prescription for refills of your eliquis to take after you complete the starter pack.  Before you complete your 3 month course, please discuss with your PCP whether or not you should continue anticoagulation long term (this  will entail Grant Sparks risk and benefit discussion about long term anticoagulation and your risk of recurrent blood clot, you can think about following up with hematology as well).    Please make sure you're up to date with routine cancer screening.  Stop your aspirin.  Return for new, recurrent, or worsening symptoms.  Please ask your PCP to request records from this hospitalization so they know what was done and what the next steps will be.   Increase activity slowly   Complete by: As directed      Allergies as of 10/24/2019      Reactions   Ambien [zolpidem] Other (See Comments)   Hallucinations/manic   Tadalafil Other (See Comments)   Hallucinations      Medication List    STOP taking these medications   amLODipine 5 MG tablet Commonly known as: NORVASC   amoxicillin-clavulanate 875-125 MG tablet Commonly known as: AUGMENTIN   aspirin 81 MG chewable tablet   aspirin EC 325 MG tablet   Bayer Low Dose 81 MG EC tablet Generic drug: aspirin   Citrucel 500 MG Tabs Generic drug: Methylcellulose (Laxative)   naproxen sodium 220 MG tablet Commonly known as: ALEVE   nitroGLYCERIN 0.4 MG SL tablet Commonly known as: NITROSTAT   OVER THE COUNTER MEDICATION   predniSONE 20 MG tablet Commonly known as: DELTASONE     TAKE these medications   Apixaban Starter Pack (10mg  and 5mg ) Commonly known as: ELIQUIS STARTER PACK Take as directed on package: start with two-5mg  tablets twice daily for 7 days. On day 8, switch to one-5mg  tablet twice daily.   apixaban 5 MG Tabs tablet Commonly known as: ELIQUIS Take 1 tablet (5 mg total) by mouth 2 (two) times daily. (start this after you complete the starter pack) Start taking on: November 18, 2019   atorvastatin 40 MG tablet Commonly known as: LIPITOR Take 40 mg by mouth daily.   glipiZIDE 5 MG 24 hr tablet Commonly known as: GLUCOTROL XL Take 5 mg by mouth every morning.   hydrochlorothiazide 12.5 MG tablet Commonly known as:  HYDRODIURIL Take 12.5 mg by mouth in the morning. What changed: Another medication with the same name was removed. Continue taking this medication, and follow the directions you see here.   Levemir FlexPen 100 UNIT/ML FlexPen Generic drug: insulin detemir Inject 30-40 Units into the skin See admin instructions. Inject 40 unit into the skin in the morning before breakfast and 30 units at bedtime   metFORMIN 500 MG 24 hr tablet Commonly known as: GLUCOPHAGE-XR Take 1,000 mg by mouth daily after supper. What changed: Another medication with the same name was removed. Continue taking this medication, and follow the directions you see here.   metoprolol tartrate  50 MG tablet Commonly known as: LOPRESSOR Take 50 mg by mouth 2 (two) times daily.   ramipril 10 MG capsule Commonly known as: ALTACE Take 10 mg by mouth in the morning. What changed: Another medication with the same name was removed. Continue taking this medication, and follow the directions you see here.      Allergies  Allergen Reactions  . Ambien [Zolpidem] Other (See Comments)    Hallucinations/manic  . Tadalafil Other (See Comments)    Hallucinations       The results of significant diagnostics from this hospitalization (including imaging, microbiology, ancillary and laboratory) are listed below for reference.    Significant Diagnostic Studies: DG Chest 2 View  Result Date: 10/21/2019 CLINICAL DATA:  Chest pain. Shortness of breath. Symptoms for 2-3 days, progressive. EXAM: CHEST - 2 VIEW COMPARISON:  Radiograph 04/17/2016 FINDINGS: Heart is normal in size. Normal mediastinal contours. Aortic atherosclerosis. No pulmonary edema. Minor subsegmental atelectasis at the lung bases. No confluent airspace disease. No pneumothorax or pleural effusion. Surgical hardware in the lower cervical spine. No acute osseous abnormalities are seen. IMPRESSION: Minor bibasilar atelectasis. Aortic Atherosclerosis (ICD10-I70.0).  Electronically Signed   By: Narda Rutherford M.D.   On: 10/21/2019 18:49   CT Angio Chest PE W and/or Wo Contrast  Result Date: 10/22/2019 CLINICAL DATA:  Shortness of breath and chest pain. EXAM: CT ANGIOGRAPHY CHEST WITH CONTRAST TECHNIQUE: Multidetector CT imaging of the chest was performed using the standard protocol during bolus administration of intravenous contrast. Multiplanar CT image reconstructions and MIPs were obtained to evaluate the vascular anatomy. CONTRAST:  60mL OMNIPAQUE IOHEXOL 350 MG/ML SOLN COMPARISON:  None. FINDINGS: Cardiovascular: The heart size is mildly enlarged. Aortic atherosclerosis identified. Multi vessel coronary artery atherosclerotic calcifications. Large bilateral central pulmonary artery filling defects are identified. Filling defects extend into the lobar and segmental pulmonary arteries bilaterally. The RV to LV ratio is equal to 0.94. Mediastinum/Nodes: No enlarged mediastinal, hilar, or axillary lymph nodes. Thyroid gland, trachea, and esophagus demonstrate no significant findings. Lungs/Pleura: No pleural effusion identified. No airspace consolidation, atelectasis or pneumothorax identified. Increase interstitial markings are identified within the lower lung zones peripherally. No suspicious pulmonary nodule or mass identified. Focal area of pleural calcification is identified overlying the right upper lobe, image 32/5. Upper Abdomen: No acute abnormality. Musculoskeletal: Mild multilevel degenerative disc disease is noted. Previous ACDF of the cervical spine. Review of the MIP images confirms the above findings. IMPRESSION: 1. Positive for acute PE with CT evidence of right heart strain (RV/LV Ratio = 0.94) consistent with at least submassive (intermediate risk) PE. The presence of right heart strain has been associated with an increased risk of morbidity and mortality. 2. Coronary artery calcifications noted. 3. Aortic atherosclerosis. Aortic Atherosclerosis  (ICD10-I70.0). Critical Value/emergent results were called by telephone at the time of interpretation on 10/22/2019 at 8:01 am to provider DAN FLOYD , who verbally acknowledged these results. Electronically Signed   By: Signa Kell M.D.   On: 10/22/2019 08:02   ECHOCARDIOGRAM COMPLETE  Result Date: 10/22/2019    ECHOCARDIOGRAM REPORT   Patient Name:   CHENG DEC Kakar Date of Exam: 10/22/2019 Medical Rec #:  [id number]     Height:       72.0 in Accession #:    [id number]    Weight:       165.0 lb Date of Birth:  06-May-1945     BSA:          1.963 m Patient Age:  74 years      BP:           148/70 mmHg Patient Gender: M             HR:           94 bpm. Exam Location:  Inpatient Procedure: 2D Echo, Cardiac Doppler and Color Doppler STAT ECHO Indications:    I26.02 Pulmonary embolus  History:        Patient has prior history of Echocardiogram examinations, most                 recent 10/15/2016. Previous Myocardial Infarction and CAD,                 Stroke; Risk Factors:Diabetes.  Sonographer:    Tiffany Dance Referring Phys: [id number] DAN FLOYD IMPRESSIONS  1. Left ventricular ejection fraction, by estimation, is 70 to 75%. The left ventricle has hyperdynamic function. The left ventricle has no regional wall motion abnormalities. There is moderate left ventricular hypertrophy. Indeterminate diastolic filling due to E-Kimla Furth fusion.  2. Right ventricular systolic function is normal. The right ventricular size is normal. Tricuspid regurgitation signal is inadequate for assessing PA pressure.  3. The mitral valve is normal in structure. No evidence of mitral valve regurgitation. No evidence of mitral stenosis.  4. The aortic valve is tricuspid. Aortic valve regurgitation is not visualized. No aortic stenosis is present.  5. The inferior vena cava is normal in size with greater than 50% respiratory variability, suggesting right atrial pressure of 3 mmHg. FINDINGS  Left Ventricle: Left ventricular ejection fraction, by  estimation, is 70 to 75%. The left ventricle has hyperdynamic function. The left ventricle has no regional wall motion abnormalities. The left ventricular internal cavity size was normal in size. There is moderate left ventricular hypertrophy. Indeterminate diastolic filling due to E-Deanglo Hissong fusion. Right Ventricle: The right ventricular size is normal. No increase in right ventricular wall thickness. Right ventricular systolic function is normal. Tricuspid regurgitation signal is inadequate for assessing PA pressure. Left Atrium: Left atrial size was normal in size. Right Atrium: Right atrial size was normal in size. Pericardium: There is no evidence of pericardial effusion. Mitral Valve: The mitral valve is normal in structure. Normal mobility of the mitral valve leaflets. Mild mitral annular calcification. No evidence of mitral valve regurgitation. No evidence of mitral valve stenosis. Tricuspid Valve: The tricuspid valve is normal in structure. Tricuspid valve regurgitation is trivial. No evidence of tricuspid stenosis. Aortic Valve: The aortic valve is tricuspid. . There is mild thickening and mild calcification of the aortic valve. Aortic valve regurgitation is not visualized. No aortic stenosis is present. There is mild thickening of the aortic valve. There is mild calcification of the aortic valve. Pulmonic Valve: The pulmonic valve was normal in structure. Pulmonic valve regurgitation is trivial. No evidence of pulmonic stenosis. Aorta: The aortic root and ascending aorta are structurally normal, with no evidence of dilitation. Venous: The inferior vena cava is normal in size with greater than 50% respiratory variability, suggesting right atrial pressure of 3 mmHg. IAS/Shunts: No atrial level shunt detected by color flow Doppler.  LEFT VENTRICLE PLAX 2D LVIDd:         4.50 cm  Diastology LVIDs:         3.10 cm  LV e' lateral: 4.79 cm/s LV PW:         1.30 cm  LV e' medial:  4.68 cm/s LV IVS:  1.50 cm LVOT  diam:     1.90 cm LV SV:         58 LV SV Index:   30 LVOT Area:     2.84 cm  RIGHT VENTRICLE             IVC RV Basal diam:  2.60 cm     IVC diam: 1.40 cm RV S prime:     14.90 cm/s TAPSE (M-mode): 2.1 cm LEFT ATRIUM             Index       RIGHT ATRIUM           Index LA diam:        3.80 cm 1.94 cm/m  RA Area:     14.40 cm LA Vol (A2C):   59.6 ml 30.36 ml/m RA Volume:   32.40 ml  16.50 ml/m LA Vol (A4C):   48.1 ml 24.50 ml/m LA Biplane Vol: 56.3 ml 28.68 ml/m  AORTIC VALVE LVOT Vmax:   101.00 cm/s LVOT Vmean:  70.200 cm/s LVOT VTI:    0.206 m  AORTA Ao Root diam: 3.60 cm Ao Asc diam:  3.40 cm MV Aryka Coonradt velocity: 95.80 cm/s                            SHUNTS                            Systemic VTI:  0.21 m                            Systemic Diam: 1.90 cm Weston Brass MD Electronically signed by Weston Brass MD Signature Date/Time: 10/22/2019/9:52:00 AM    Final    VAS [place] LOWER EXTREMITY VENOUS (DVT)  Result Date: 10/22/2019  Lower Venous DVTStudy Indications: Swelling.  Risk Factors: Confirmed PE. Limitations: Poor ultrasound/tissue interface. Comparison Study: No prior studies. Performing Technologist: Jean Rosenthal  Examination Guidelines: Brecken Walth complete evaluation includes B-mode imaging, spectral Doppler, color Doppler, and power Doppler as needed of all accessible portions of each vessel. Bilateral testing is considered an integral part of [place] complete examination. Limited examinations for reoccurring indications may be performed as noted. The reflux portion of the exam is performed with the patient in reverse Trendelenburg.  +---------+---------------+---------+-----------+----------+--------------+ RIGHT    CompressibilityPhasicitySpontaneityPropertiesThrombus Aging +---------+---------------+---------+-----------+----------+--------------+ CFV      Full           Yes      Yes                                 +---------+---------------+---------+-----------+----------+--------------+ SFJ       Full                                                        +---------+---------------+---------+-----------+----------+--------------+ FV Prox  Full                                                        +---------+---------------+---------+-----------+----------+--------------+  FV Mid   Full                                                        +---------+---------------+---------+-----------+----------+--------------+ FV DistalFull                                                        +---------+---------------+---------+-----------+----------+--------------+ PFV      Full                                                        +---------+---------------+---------+-----------+----------+--------------+ POP      Full           Yes      Yes                                 +---------+---------------+---------+-----------+----------+--------------+ PTV      Full                                                        +---------+---------------+---------+-----------+----------+--------------+ PERO     Full                                                        +---------+---------------+---------+-----------+----------+--------------+   +---------+---------------+---------+-----------+----------+--------------+ LEFT     CompressibilityPhasicitySpontaneityPropertiesThrombus Aging +---------+---------------+---------+-----------+----------+--------------+ CFV      Full           Yes      Yes                                 +---------+---------------+---------+-----------+----------+--------------+ SFJ      Full                                                        +---------+---------------+---------+-----------+----------+--------------+ FV Prox  Full                                                        +---------+---------------+---------+-----------+----------+--------------+ FV Mid   Full                                                         +---------+---------------+---------+-----------+----------+--------------+  FV DistalFull                                                        +---------+---------------+---------+-----------+----------+--------------+ PFV      Full                                                        +---------+---------------+---------+-----------+----------+--------------+ POP      None           No       No                   Acute          +---------+---------------+---------+-----------+----------+--------------+ PTV      None           No       No                   Acute          +---------+---------------+---------+-----------+----------+--------------+ PERO                                                  Not visualized +---------+---------------+---------+-----------+----------+--------------+     Summary: RIGHT: - There is no evidence of deep vein thrombosis in the lower extremity.  - No cystic structure found in the popliteal fossa.  LEFT: - Findings consistent with acute deep vein thrombosis involving the left popliteal vein, and left posterior tibial veins. - No cystic structure found in the popliteal fossa.  *See table(s) above for measurements and observations. Electronically signed by Sherald Hess MD on 10/22/2019 at 3:20:26 PM.    Final     Microbiology: Recent Results (from the past 240 hour(s))  SARS Coronavirus 2 by RT PCR (hospital order, performed in [facility] hospital lab) Nasopharyngeal Nasopharyngeal Swab     Status: None   Collection Time: 10/22/19  6:36 AM   Specimen: Nasopharyngeal Swab  Result Value Ref Range Status   SARS Coronavirus 2 NEGATIVE NEGATIVE Final    Comment: (NOTE) SARS-CoV-2 target nucleic acids are NOT DETECTED.  The SARS-CoV-2 RNA is generally detectable in upper and lower respiratory specimens during the acute phase of infection. The lowest concentration of SARS-CoV-2 viral copies this assay can detect is  250 copies / mL. Kweli Grassel negative result does not preclude SARS-CoV-2 infection and should not be used as the sole basis for treatment or other patient management decisions.  Kypton Eltringham negative result may occur with improper specimen collection / handling, submission of specimen other than nasopharyngeal swab, presence of viral mutation(s) within the areas targeted by this assay, and inadequate number of viral copies (<250 copies / mL). Kenshin Splawn negative result must be combined with clinical observations, patient history, and epidemiological information.  Fact Sheet for Patients:   BoilerBrush.com.cy  Fact Sheet for Healthcare Providers: https://pope.com/  This test is not yet approved or  cleared by the [place] FDA and has been authorized for detection and/or diagnosis of SARS-CoV-2 by FDA under an Emergency Use Authorization (EUA).  This EUA will remain in effect (meaning this test can be used) for the duration of the COVID-19 declaration under Section 564(b)(1) of the Act, 21 U.S.C. section 360bbb-3(b)(1), unless the authorization is terminated or revoked sooner.  Performed at [facility], [address] [place], [place] [id number]      Labs: Basic Metabolic Panel: Recent Labs  Lab 10/21/19 1741 10/23/19 0236 10/24/19 0204  NA 138 135 135  K 4.1 4.1 4.3  CL 102 102 103  CO2 25 21* 22  GLUCOSE 105* 229* 196*  BUN 24* 18 17  CREATININE 1.63* 1.22 1.18  CALCIUM 9.1 8.6* 8.7*  MG  --   --  2.0  PHOS  --   --  2.8   Liver Function Tests: Recent Labs  Lab 10/24/19 0204  AST 17  ALT 17  ALKPHOS 73  BILITOT 0.6  PROT 5.8*  ALBUMIN 2.9*   No results for input(s): LIPASE, AMYLASE in the last 168 hours. No results for input(s): AMMONIA in the last 168 hours. CBC: Recent Labs  Lab 10/21/19 1741 10/23/19 0236 10/24/19 0204  WBC 8.2 7.3 6.8  HGB 12.9* 12.3* 12.0*  HCT 39.7 36.2* 35.5*  MCV 92.3 90.5 90.6  PLT 168 156  159   Cardiac Enzymes: No results for input(s): CKTOTAL, CKMB, CKMBINDEX, TROPONINI in the last 168 hours. BNP: BNP (last 3 results) Recent Labs    10/22/19 0640  BNP 155.3*    ProBNP (last 3 results) No results for input(s): PROBNP in the last 8760 hours.  CBG: Recent Labs  Lab 10/23/19 1118 10/23/19 1553 10/23/19 2120 10/24/19 0611 10/24/19 1137  GLUCAP 292* 314* 291* 157* 204*       Signed:  Lacretia Nicksaldwell Powell MD.  [facility] 10/24/2019, 2:43 PM

## 2019-10-24 NOTE — Progress Notes (Signed)
PT Cancellation Note  Patient Details Name: Grant Sparks MRN: [id number] DOB: 08/28/1945   Cancelled Treatment:    Reason Eval/Treat Not Completed: (P) Patient declined, no reason specified (eating lunch will return as time permits.)   Florestine Avers 10/24/2019, 12:45 PM  Bonney Leitz , PTA Acute Rehabilitation Services Pager [phone] Office [phone]

## 2019-10-24 NOTE — Progress Notes (Addendum)
Applied o2 at 2 l/m n.c. when patient is sleeping his o2 sats drop low 80's with good wave form. When awake mid 90's

## 2019-10-24 NOTE — Progress Notes (Addendum)
ANTICOAGULATION CONSULT NOTE   Pharmacy Consult for Apixaban Indication: pulmonary embolus  Allergies  Allergen Reactions  . Ambien [Zolpidem] Other (See Comments)    Hallucinations/manic  . Tadalafil Other (See Comments)    Hallucinations     Patient Measurements: Height: 6' (182.9 cm) Weight: 79.3 kg (174 lb 13.2 oz) IBW/kg (Calculated) : 77.6 Heparin Dosing Weight: 74.8 kg   Vital Signs: Temp: 98 F (36.7 C) (08/28 0512) Temp Source: Oral (08/28 0512) BP: 150/83 (08/28 0512) Pulse Rate: 74 (08/28 0512)  Labs: Recent Labs    10/21/19 1741 10/21/19 1741 10/21/19 2021 10/22/19 1640 10/23/19 0236 10/24/19 0204  HGB 12.9*   < >  --   --  12.3* 12.0*  HCT 39.7  --   --   --  36.2* 35.5*  PLT 168  --   --   --  156 159  HEPARINUNFRC  --   --   --  0.72* 0.59 0.28*  CREATININE 1.63*  --   --   --  1.22 1.18  TROPONINIHS 72*  --  67*  --   --   --    < > = values in this interval not displayed.    Estimated Creatinine Clearance: 60.3 mL/min (by C-G formula based on SCr of 1.18 mg/dL).   Assessment: 53 YOM who presents to the ED with dyspnea found to have CT evidence of PE with right heart strain. Pharmacy consulted to start oral apixaban. Hgb mildly low, Plt wnl. SCr improved to 1.19. No dose adjustment required for renal impairment.   Heparin level subtherapeutic (0.28) on gtt at 1150 units/hr. No bleeding noted. Switch to apixaban per MD.   Goal of Therapy:   Monitor platelets by anticoagulation protocol: Yes   Plan:  Discontinue heparin drip  Initiate apixaban 10 mg twice daily for 7 days followed by 5 mg twice daily  Lamar Sprinkles, PharmD PGY1 Pharmacy Resident 10/24/2019 8:38 AM  I discussed / reviewed the pharmacy note by Dr. Arvilla Market and I agree with the resident's findings and plans as documented.

## 2019-10-24 NOTE — Progress Notes (Signed)
Physical Therapy Treatment Patient Details Name: Grant Sparks MRN: [id number] DOB: [birth year] Today's Date: 10/24/2019    History of Present Illness Patient is a 74 y/o male who presents with SOB, chest pain and fall. Found to have acute bilateral PEs with right heart strain and acute DVT LLE. PMH includes CVA. MI, CAD, DM.    PT Comments    Pt supine in bed on arrival this session.  Pt required decreased assistance and very motivated to progress and d/c home this pm.  Post gt and stair training left on commode to have BM.  Informed nursing of location.  Will continue to recommend f/u HHPT at d/c.      Follow Up Recommendations  Home health PT;Supervision for mobility/OOB     Equipment Recommendations  None recommended by PT    Recommendations for Other Services       Precautions / Restrictions Precautions Precautions: Fall Precaution Comments: watch 02 Restrictions Weight Bearing Restrictions: No    Mobility  Bed Mobility Overal bed mobility: Needs Assistance Bed Mobility: Supine to Sit     Supine to sit: Modified independent (Device/Increase time)        Transfers Overall transfer level: Needs assistance Equipment used: Rolling walker (2 wheeled) Transfers: Sit to/from Stand Sit to Stand: Min guard         General transfer comment: MIn guard for safety with cues for hand placement.  Ambulation/Gait Ambulation/Gait assistance: Min guard Gait Distance (Feet): 120 Feet Assistive device: Rolling walker (2 wheeled) Gait Pattern/deviations: Step-through pattern;Decreased dorsiflexion - left;Trunk flexed;Decreased stride length Gait velocity: decreased   General Gait Details: Slow, mildly unsteady gait with foot drop LLE (declined wearing AFO), cues for RW proximity, management. 2-3/4 DOE. Sp02 remained >91% on RA.   Stairs Stairs: Yes Stairs assistance: Supervision Stair Management: One rail Right;Two rails Number of Stairs: 12 General stair comments:  Cues for safety and use of railings.   Wheelchair Mobility    Modified Rankin (Stroke Patients Only)       Balance Overall balance assessment: Needs assistance;History of Falls Sitting-balance support: Feet supported;No upper extremity supported Sitting balance-Leahy Scale: Good       Standing balance-Leahy Scale: Poor                              Cognition Arousal/Alertness: Awake/alert Behavior During Therapy: WFL for tasks assessed/performed Overall Cognitive Status: History of cognitive impairments - at baseline                                        Exercises      General Comments        Pertinent Vitals/Pain Pain Assessment: No/denies pain    Home Living                      Prior Function            PT Goals (current goals can now be found in the care plan section) Acute Rehab PT Goals Patient Stated Goal: to get home Potential to Achieve Goals: Good Progress towards PT goals: Progressing toward goals    Frequency    Min 3X/week      PT Plan Current plan remains appropriate    Co-evaluation              AM-PAC PT "6  Clicks" Mobility   Outcome Measure  Help needed turning from your back to your side while in a flat bed without using bedrails?: None Help needed moving from lying on your back to sitting on the side of a flat bed without using bedrails?: None Help needed moving to and from a bed to a chair (including a wheelchair)?: A Little Help needed standing up from a chair using your arms (e.g., wheelchair or bedside chair)?: A Little Help needed to walk in hospital room?: A Little Help needed climbing 3-5 steps with a railing? : None 6 Click Score: 21    End of Session Equipment Utilized During Treatment: Gait belt Activity Tolerance: Patient tolerated treatment well;Patient limited by fatigue Patient left: with call bell/phone within reach;with family/visitor present (seated on commode to  have a BM, instructed to pull cord when finished.) Nurse Communication: Mobility status PT Visit Diagnosis: Difficulty in walking, not elsewhere classified (R26.2);Unsteadiness on feet (R26.81)     Time: 5732-2025 PT Time Calculation (min) (ACUTE ONLY): 11 min  Charges:  $Gait Training: 8-22 mins                     Bonney Leitz , PTA Acute Rehabilitation Services Pager [phone] Office [phone]     Sarabeth Benton Artis Delay 10/24/2019, 3:29 PM

## 2019-10-24 NOTE — Progress Notes (Signed)
Pt discharged from unit. Medication/discharge instruction given, VSS   Pina Sirianni K Kavian Peters, RN   

## 2019-10-24 NOTE — Discharge Instructions (Signed)
Information on my medicine - ELIQUIS (apixaban)  This medication education was reviewed with me or my healthcare representative as part of my discharge preparation.  The pharmacist that spoke with me during my hospital stay was:  Leander Rams, [facility]  Why was Eliquis prescribed for you? Eliquis was prescribed to treat blood clots that may have been found in the veins of your legs (deep vein thrombosis) or in your lungs (pulmonary embolism) and to reduce the risk of them occurring again.  What do You need to know about Eliquis ? The starting dose is 10 mg (two 5 mg tablets) taken TWICE daily for the FIRST SEVEN (7) DAYS, then on (enter date)  10/30/19  the dose is reduced to ONE 5 mg tablet taken TWICE daily.  Eliquis may be taken with or without food.   Try to take the dose about the same time in the morning and in the evening. If you have difficulty swallowing the tablet whole please discuss with your pharmacist how to take the medication safely.  Take Eliquis exactly as prescribed and DO NOT stop taking Eliquis without talking to the doctor who prescribed the medication.  Stopping may increase your risk of developing a new blood clot.  Refill your prescription before you run out.  After discharge, you should have regular check-up appointments with your healthcare provider that is prescribing your Eliquis.    What do you do if you miss a dose? If a dose of ELIQUIS is not taken at the scheduled time, take it as soon as possible on the same day and twice-daily administration should be resumed. The dose should not be doubled to make up for a missed dose.  Important Safety Information A possible side effect of Eliquis is bleeding. You should call your healthcare provider right away if you experience any of the following: ? Bleeding from an injury or your nose that does not stop. ? Unusual colored urine (red or dark brown) or unusual colored stools (red or black). ? Unusual bruising for  unknown reasons. ? A serious fall or if you hit your head (even if there is no bleeding).  Some medicines may interact with Eliquis and might increase your risk of bleeding or clotting while on Eliquis. To help avoid this, consult your healthcare provider or pharmacist prior to using any new prescription or non-prescription medications, including herbals, vitamins, non-steroidal anti-inflammatory drugs (NSAIDs) and supplements.  This website has more information on Eliquis (apixaban): http://[email or web address].com/eliquis/home

## 2019-11-04 ENCOUNTER — Encounter: Payer: Self-pay | Admitting: Cardiology

## 2019-11-04 DIAGNOSIS — Z7189 Other specified counseling: Secondary | ICD-10-CM | POA: Insufficient documentation

## 2019-11-04 NOTE — Progress Notes (Signed)
Cardiology Office Note   Date:  11/05/2019   ID:  Grant Sparks, DOB October 10, 1945, MRN [id number]  PCP:  Grant Late, MD  Cardiologist:   No primary care provider on file. Referring:  Grant Late, MD  Chief Complaint  Patient presents with  . Fatigue      History of Present Illness: Grant Sparks is a 74 y.o. male who presents for follow up of pulmonary emboli.  He was admitted to the hospital in August with this and was found to have bilateral PE.  I reviewed these records for this visit.  He had an echo with NL LV function and no evidence of LV strain.  He had a left leg DVT.    I last saw him in 2017 for evaluation of SOB.  He had a low risk Lexiscan Myoview.    He had a stroke in 2004 as well as his coronary disease as described below.  He had not been having any chest pain over the years.  He has residual deficits from his stroke.  He been gradually getting weaker and he says over about 6 months.  This was a subtle change where he found himself over the last year unable to walk up 150 yards up a slight incline.  He is not having any resting shortness of breath, PND or orthopnea.  He is not having any resting chest discomfort, neck or arm discomfort.  His acute symptoms happened a couple of days before his presentation.  He had some chest discomfort and increased shortness of breath.   Past Medical History:  Diagnosis Date  . CAD (coronary artery disease)    S/P stenting of the RCA x2 and PTCA of a PDA lesion in Jan 2004. Pt had a myoracidal infarction when he was in rehabilitation following a CVA.  . CKD (chronic kidney disease), stage II   . CVA (cerebral vascular accident) ([place])    Right  CVA, left hemiparesis  . Diabetes mellitus without complication ([place])   . MI (myocardial infarction) ([place])   . Pulmonary emboli [facility])     Past Surgical History:  Procedure Laterality Date  . APPENDECTOMY    . CATARACT EXTRACTION    . CERVICAL SPINE SURGERY    . coronary  artery stent placement     2004     Current Outpatient Medications  Medication Sig Dispense Refill  . [START ON 11/18/2019] apixaban (ELIQUIS) 5 MG TABS tablet Take 1 tablet (5 mg total) by mouth 2 (two) times daily. (start this after you complete the starter pack) 60 tablet 1  . atorvastatin (LIPITOR) 40 MG tablet Take 40 mg by mouth daily.     [name] glipiZIDE (GLUCOTROL XL) 5 MG 24 hr tablet Take 5 mg by mouth every morning.     . hydrochlorothiazide (HYDRODIURIL) 12.5 MG tablet Take 12.5 mg by mouth in the morning.    . insulin detemir (LEVEMIR FLEXPEN) 100 UNIT/ML FlexPen Inject 30-40 Units into the skin See admin instructions. Inject 40 unit into the skin in the morning before breakfast and 30 units at bedtime    . metFORMIN (GLUCOPHAGE-XR) 500 MG 24 hr tablet Take 1,000 mg by mouth daily after supper.    . metoprolol (LOPRESSOR) 50 MG tablet Take 50 mg by mouth 2 (two) times daily.      . ramipril (ALTACE) 10 MG capsule Take 10 mg by mouth in the morning.     No current facility-administered medications for this visit.  Allergies:   Ambien [zolpidem] and Tadalafil    Social History:  The patient  reports that he has never smoked. He has never used smokeless tobacco. He reports that he does not drink alcohol and does not use drugs.   Family History:  The patient's family history includes Diabetes in his brother, father, mother, and sister; Heart attack (age of onset: 81) in his brother; Heart attack (age of onset: 36) in his father; Heart attack (age of onset: 23) in his mother.    ROS:  Please see the history of present illness.   Otherwise, review of systems are positive for none.   All other systems are reviewed and negative.    PHYSICAL EXAM: VS:  BP (!) 118/50   Pulse 74   Ht 6' (1.829 m)   Wt 179 lb 12.8 oz (81.6 kg)   SpO2 97%   BMI 24.39 kg/m  , BMI Body mass index is 24.39 kg/m. GENERAL: Slightly frail appearing for his age 75:  Pupils equal round and reactive,  fundi not visualized, oral mucosa unremarkable NECK:  No jugular venous distention, waveform within normal limits, carotid upstroke brisk and symmetric, no bruits, no thyromegaly LYMPHATICS:  No cervical, inguinal adenopathy LUNGS:  Clear to auscultation bilaterally BACK:  No CVA tenderness CHEST:  Unremarkable HEART:  PMI not displaced or sustained,S1 and S2 within normal limits, no S3, no S4, no clicks, no rubs, no murmurs ABD:  Flat, positive bowel sounds normal in frequency in pitch, no bruits, no rebound, no guarding, no midline pulsatile mass, no hepatomegaly, no splenomegaly EXT:  2 plus pulses throughout, no edema, no cyanosis no clubbing SKIN:  No rashes no nodules NEURO:  Cranial nerves II through XII grossly intact, motor grossly intact throughout PSYCH:  Cognitively intact, oriented to person place and time    EKG:  EKG is not ordered today.    Recent Labs: 10/22/2019: B Natriuretic Peptide 155.3 10/24/2019: ALT 17; BUN 17; Creatinine, Ser 1.18; Hemoglobin 12.0; Magnesium 2.0; Platelets 159; Potassium 4.3; Sodium 135    Lipid Panel    Component Value Date/Time   CHOL 127 10/14/2016 0341   TRIG 68 10/14/2016 0341   HDL 35 (L) 10/14/2016 0341   CHOLHDL 3.6 10/14/2016 0341   VLDL 14 10/14/2016 0341   LDLCALC 78 10/14/2016 0341      Wt Readings from Last 3 Encounters:  11/05/19 179 lb 12.8 oz (81.6 kg)  10/22/19 174 lb 13.2 oz (79.3 kg)  02/12/18 162 lb (73.5 kg)      Other studies Reviewed: Additional studies/ records that were reviewed today include: Hospital records reviewed. Review of the above records demonstrates:  Please see elsewhere in the note.     ASSESSMENT AND PLAN:  PULMONARY EMBOLISM: He is going to continue his anticoagulation for at least 6 months and then perhaps low-dose following that.  He did have a long car drive but this was many weeks before his presentation so we could consider this unprovoked particularly with his family history of his  brother having blood clots in his 59s.  DM:  A1c was 8.4.  He is now seeing an endocrinologist.  CVA HISTORY: He has had this years ago and residual.  No further work-up.  AKI:  His creat was last 1.63.  I will check a follow-up be met.  CAD: He has had no chest discomfort.  He had his last stress test in 2017.  I am not planning ischemia work-up.  I will check  a lipid profile for risk reduction.  SLEEPINESS:  There was mention on his discharge summary that he needs a sleep study.  I will also check a TSH.  He has a high sleepiness for Epworth score.  He has witnessed apnea.  ANEMIA:  This was chronic.    COVID EDUCATION: He has had his vaccine.  Current medicines are reviewed at length with the patient today.  The patient does not have concerns regarding medicines.  The following changes have been made:  no change  Labs/ tests ordered today include:   Orders Placed This Encounter  Procedures  . Lipid panel  . TSH  . Basic metabolic panel  . Split night study     Disposition:   FU with in 2 months.     Signed, Minus Breeding, MD  11/05/2019 10:23 AM    [facility]

## 2019-11-05 ENCOUNTER — Ambulatory Visit: Payer: Medicare Other | Admitting: Cardiology

## 2019-11-05 ENCOUNTER — Encounter: Payer: Self-pay | Admitting: Cardiology

## 2019-11-05 ENCOUNTER — Other Ambulatory Visit: Payer: Self-pay

## 2019-11-05 ENCOUNTER — Telehealth: Payer: Self-pay | Admitting: *Deleted

## 2019-11-05 VITALS — BP 118/50 | HR 74 | Ht 72.0 in | Wt 179.8 lb

## 2019-11-05 DIAGNOSIS — Z7189 Other specified counseling: Secondary | ICD-10-CM | POA: Diagnosis not present

## 2019-11-05 DIAGNOSIS — I82432 Acute embolism and thrombosis of left popliteal vein: Secondary | ICD-10-CM | POA: Diagnosis not present

## 2019-11-05 DIAGNOSIS — N179 Acute kidney failure, unspecified: Secondary | ICD-10-CM | POA: Diagnosis not present

## 2019-11-05 DIAGNOSIS — R0681 Apnea, not elsewhere classified: Secondary | ICD-10-CM

## 2019-11-05 DIAGNOSIS — I2699 Other pulmonary embolism without acute cor pulmonale: Secondary | ICD-10-CM | POA: Diagnosis not present

## 2019-11-05 DIAGNOSIS — R5383 Other fatigue: Secondary | ICD-10-CM

## 2019-11-05 NOTE — Patient Instructions (Signed)
Medication Instructions:  No Changes In Medications at this time.  *If you need a refill on your cardiac medications before your next appointment, please call your pharmacy*   Lab Work: Lipid Panel- this does need to be fasting, BMET, TSH- these can be done in [place]  If you have labs (blood work) drawn today and your tests are completely normal, you will receive your results only by: [name] MyChart Message (if you have MyChart) OR . A paper copy in the mail If you have any lab test that is abnormal or we need to change your treatment, we will call you to review the results.  Testing/Procedures:  Your physician has recommended that you have a sleep study. This test records several body functions during sleep, including: brain activity, eye movement, oxygen and carbon dioxide blood levels, heart rate and rhythm, breathing rate and rhythm, the flow of air through your mouth and nose, snoring, body muscle movements, and chest and belly movement.  Follow-Up: At [facility], you and your health needs are our priority.  As part of our continuing mission to provide you with exceptional heart care, we have created designated Provider Care Teams.  These Care Teams include your primary Cardiologist (physician) and Advanced Practice Providers (APPs -  Physician Assistants and Nurse Practitioners) who all work together to provide you with the care you need, when you need it.  We recommend signing up for the patient portal called "MyChart".  Sign up information is provided on this After Visit Summary.  MyChart is used to connect with patients for Virtual Visits (Telemedicine).  Patients are able to view lab/test results, encounter notes, upcoming appointments, etc.  Non-urgent messages can be sent to your provider as well.   To learn more about what you can do with MyChart, go to [email or web address].    Your next appointment:   2 month(s)  The format for your next appointment:   In  Person  Provider:   [name], MD

## 2019-11-05 NOTE — Telephone Encounter (Signed)
-----   Message from Baird Cancer, RN sent at 11/05/2019 10:16 AM EDT ----- Regarding: Sleep Study Ordered Good morning, I ordered a sleep study on this patient today to be done at [facility]. Epworth scale is 15 and in chart.  Thank you  Cleotilde Neer RN

## 2019-11-05 NOTE — Telephone Encounter (Signed)
Patient notified of sleep study appointment details. 

## 2019-11-06 ENCOUNTER — Ambulatory Visit: Payer: Medicare Other | Attending: Internal Medicine

## 2019-11-06 DIAGNOSIS — R2681 Unsteadiness on feet: Secondary | ICD-10-CM | POA: Diagnosis present

## 2019-11-06 DIAGNOSIS — M6281 Muscle weakness (generalized): Secondary | ICD-10-CM | POA: Diagnosis not present

## 2019-11-06 NOTE — Therapy (Signed)
[facility] [address] [place], [place], [address] Phone: [phone]   Fax:  [phone]  Physical Therapy Evaluation  Patient Details  Name: Grant Sparks MRN: [id number] Date of Birth: [birth year] Referring Provider (PT): Dr. Lacretia Nicks   Encounter Date: 11/06/2019   PT End of Session - 11/06/19 1050    Visit Number 1    Number of Visits 24    Date for PT Re-Evaluation 01/29/20    Authorization Type Eval 11/06/19    Authorization Time Period 10 visit Medicare    Authorization - Visit Number 1    Authorization - Number of Visits 10    Progress Note Due on Visit 10    PT Start Time 0845    PT Stop Time 0930    PT Time Calculation (min) 45 min    Equipment Utilized During Treatment Gait belt    Activity Tolerance Patient tolerated treatment well;Patient limited by fatigue    Behavior During Therapy WFL for tasks assessed/performed           Past Medical History:  Diagnosis Date  . CAD (coronary artery disease)    S/P stenting of the RCA x2 and PTCA of a PDA lesion in Jan 2004. Pt had a myoracidal infarction when he was in rehabilitation following a CVA.  . CKD (chronic kidney disease), stage II   . CVA (cerebral vascular accident) (HCC)    Right  CVA, left hemiparesis  . Diabetes mellitus without complication (HCC)   . MI (myocardial infarction) (HCC)   . Pulmonary emboli [facility])     Past Surgical History:  Procedure Laterality Date  . APPENDECTOMY    . CATARACT EXTRACTION    . CERVICAL SPINE SURGERY    . coronary artery stent placement     2004    There were no vitals filed for this visit.    Subjective Assessment - 11/06/19 1023    Subjective Wife reports he has been weak since coming home from hospital. He is having more problems walking. Hx of stroke in 2004 for first stroke and 2nd stroke was in 10/2016. which affected his left side. In last 6 months, he hasn't been able to walk to his barn  without being out of breath. He usually drives upto the barn if he has to go in there. [place] about 150 yards away. He has cows there so he attends cows twice a week.    Patient is accompained by: Family member   Wife   Pertinent History : CVA(left sided weakness), MI, CAD, DM    Limitations Standing;Walking;House hold activities    How long can you sit comfortably? no issues    How long can you stand comfortably? 5 min    How long can you walk comfortably? 5 min    Diagnostic tests 01/2018: MRI head: 1. No acute intracranial process or abnormal enhancement of thebrain.2. Stable chronic microvascular ischemic changes, volume loss of thebrain, and chronic pontine/left basal ganglia infarcts.3. Chronic expansile sphenoid sinus disease with low signal contentswhich may represent inspissation or fungal ball.    Patient Stated Goals Walk better, lift/carry 20lbs, be able to climb in and out of my tractor    Currently in Pain? No/denies              [facility] PT Assessment - 11/06/19 1029      Assessment   Medical Diagnosis Generalized weakness    Referring Provider (PT) Dr. Lacretia Nicks  Onset Date/Surgical Date 10/21/19    Hand Dominance Right      Precautions   Precautions Fall    Required Braces or Orthoses Other Brace/Splint    Other Brace/Splint L AFO      Restrictions   Weight Bearing Restrictions No      Balance Screen   Has the patient fallen in the past 6 months Yes    How many times? 1    Has the patient had a decrease in activity level because of a fear of falling?  Yes    Is the patient reluctant to leave their home because of a fear of falling?  Yes      Home Environment   Living Environment Private residence    Living Arrangements Spouse/significant other    Available Help at Discharge Family    Type of Home House    Home Access Stairs to enter    Entrance Stairs-Number of Steps 4    Entrance Stairs-Rails Right    Home Layout Two level    Alternate Level  Stairs-Number of Steps 13    Alternate Level Stairs-Rails Right;Left;Can reach both    Home Equipment Walker - 2 wheels;Cane - quad;Cane - single point;Wheelchair - manual      Prior Function   Level of Independence Independent with basic ADLs;Independent with household mobility with device;Independent with community mobility with device   >6 months ago   Vocation Retired    [employer] has cows at home which he takes care of, works in Teaching laboratory technicianbarn      Cognition   Overall Cognitive Status Within Functional Limits for tasks assessed      Ambulation/Gait   Ambulation/Gait Yes    Ambulation/Gait Assistance 6: Modified independent (Device/Increase time)    Ambulation Distance (Feet) 410 Feet    Assistive device Rolling walker    Gait Pattern Decreased dorsiflexion - left;Decreased hip/knee flexion - left;Decreased stance time - left;Decreased step length - right;Left steppage;Left foot flat;Wide base of support;Trunk flexed;Poor foot clearance - left    Ambulation Surface Level;Indoor      6 minute walk test results    Aerobic Endurance Distance Walked 410   with RW     Standardized Balance Assessment   Standardized Balance Assessment 10 meter walk test    10 Meter Walk 0.6159m/s   with RW                     Objective measurements completed on examination: See above findings.               PT Education - 11/06/19 1048    Education provided Yes    Education Details Patient educated on gradual walking program. Starting with 5 min and working up to 30 min gradually as tolerated over next 2-3 months. Patient educated on using walker for mobility as it is safe for now. Pt edcuated on going with his wife for shopping to improve standing/waking endurance.    Person(s) Educated Patient;Spouse    Methods Explanation    Comprehension Verbalized understanding            PT Short Term Goals - 11/06/19 1035      PT SHORT TERM GOAL #1   Title Patient will be able  to ambulate 510 feet with RW in 6 minutes to improve walking endurance to improve community ambulation    Baseline 410 feet with RW in 6 MWT (eval)    Time 4  Period Weeks    Status New    Target Date 12/18/19      PT SHORT TERM GOAL #2   Title Pt will report compliance with walking program for at least 10 min with RW for 5days/week to improve walking endurance    Baseline no formal walking program    Time 4    Period Weeks    Status New    Target Date 12/18/19      PT SHORT TERM GOAL #3   Title Patient will demo 0.15 m/s improvement in walking speed using the walker to improve walking speed and improve community ambulation    Baseline 0.75m/s with RW (Eval)    Time 4    Period Weeks    Status New    Target Date 12/18/19             PT Long Term Goals - 11/06/19 1038      PT LONG TERM GOAL #1   Title Patient will be able to carry 20 lbs in one hand with use of cane and ambulate about 100' to be able to carry bucket of feed in barn.    Baseline unable to carry weight    Time 12    Period Weeks    Status New    Target Date 01/29/20      PT LONG TERM GOAL #2   Title Patient will be able to ambulate 1000' with RW on level surface to improve walking endurance with prefered assistive device    Baseline 410 feet with RW (Eval)    Time 12    Period Weeks    Status New    Target Date 01/29/20      PT LONG TERM GOAL #3   Title Patient will be able to ambulate 300 feet on grass with quad cane to improve ability to walk in his barn    Baseline unable    Time 12    Period Weeks    Status New    Target Date 01/29/20      PT LONG TERM GOAL #4   Title Patient will be able to climb up and down from his tractor safely to be able to operate his tractor to return to PLOF    Baseline unable to climb tractor since recent hospitalization    Time 12    Period Weeks    Status New    Target Date 01/29/20                  Plan - 11/06/19 1041    Clinical Impression  Statement Patient is 74 y.o. male who was seen today for generalized weakness and balance disorder after recent hospitalization due to bilateral PE and L LE DVT. Patient has hx of 2 strokes (2004, 2018) which had affected his left side. After recent hospitalzation, patient is reporting significant weakness and balance issues which is affecting his ability to return to PLOF. Patient demonstrated 410 feet of ambulation with rolling walker in 6 minute which is on low end of range for age related norms (Norm 430 feet - <2000 feet). Patient also demonstrates 0.36m/s walking speed with rolling walker which indicates that he is a limited household ambulator. Patient will benefit from skilled PT to address his strength, balance, and endurance and to address his functional gaols to return to PLOF.    Personal Factors and Comorbidities Comorbidity 2;Past/Current Experience    Comorbidities 2xCVA(left sided weakness), MI, CAD, DM  Examination-Activity Limitations Carry;Lift;Stairs;Squat;Stand;Locomotion Level    Examination-Participation Restrictions Cleaning;Community Activity;Laundry;Yard Work;Shop    Stability/Clinical Decision Making Evolving/Moderate complexity    Clinical Decision Making Moderate    Rehab Potential Good    Clinical Impairments Affecting Rehab Potential Hx of CVA, multiple health problems, weakness    PT Frequency 2x / week    PT Duration 12 weeks    PT Treatment/Interventions Therapeutic exercise;Therapeutic activities;Manual techniques;Aquatic Therapy;Balance training;Functional mobility training;Gait training;Neuromuscular re-education;Patient/family education;Dry needling;ADLs/Self Care Home Management;Electrical Stimulation;Moist Heat;Stair training;Orthotic Fit/Training;Passive range of motion;Energy conservation;Joint Manipulations    PT Next Visit Plan perform 5x sit to stand, TUG and add goals; issue HEP    PT Home Exercise Plan Walking program    Consulted and Agree with Plan of  Care Patient;Family member/caregiver    Family Member Consulted wife           Patient will benefit from skilled therapeutic intervention in order to improve the following deficits and impairments:  Abnormal gait, Cardiopulmonary status limiting activity, Decreased activity tolerance, Decreased balance, Decreased endurance, Decreased mobility, Decreased strength, Difficulty walking, Impaired flexibility, Postural dysfunction  Visit Diagnosis: Muscle weakness (generalized)  Unsteadiness on feet     Problem List Patient Active Problem List   Diagnosis Date Noted  . Educated about COVID-19 virus infection 11/04/2019  . Bilateral pulmonary embolism (HCC) 10/22/2019  . History of CVA with residual deficit 10/22/2019  . Left leg DVT (HCC) 10/22/2019  . AKI (acute kidney injury) (HCC) 10/22/2019  . Fall at home, initial encounter 10/22/2019  . Cerebral thrombosis with cerebral infarction 10/14/2016  . Normocytic anemia 10/14/2016  . Facial droop 10/13/2016  . Benign essential hypertension 10/13/2016  . Controlled type 2 diabetes mellitus with stage 3 chronic kidney disease, with long-term current use of insulin (HCC) 10/13/2016  . CVA (cerebral vascular accident) (HCC)   . CAD 03/01/2008  . CORONARY ARTERY DISEASE, S/P PTCA 03/01/2008  . Cerebral artery occlusion with cerebral infarction [facility]) 03/01/2008    Ileana Ladd, PT 11/06/2019, 10:51 AM  [facility] [address] [place], [place], [address] Phone: [phone]   Fax:  [phone]  Name: THAER MIYOSHI MRN: [id number] Date of Birth: September 22, 1945

## 2019-11-10 LAB — BASIC METABOLIC PANEL
BUN/Creatinine Ratio: 21 (ref 10–24)
BUN: 24 mg/dL (ref 8–27)
CO2: 24 mmol/L (ref 20–29)
Calcium: 9.6 mg/dL (ref 8.6–10.2)
Chloride: 103 mmol/L (ref 96–106)
Creatinine, Ser: 1.16 mg/dL (ref 0.76–1.27)
GFR calc Af Amer: 71 mL/min/{1.73_m2} (ref >59)
GFR calc non Af Amer: 62 mL/min/{1.73_m2} (ref >59)
Glucose: 205 mg/dL — ABNORMAL HIGH (ref 65–99)
Potassium: 4.7 mmol/L (ref 3.5–5.2)
Sodium: 140 mmol/L (ref 134–144)

## 2019-11-10 LAB — LIPID PANEL
Chol/HDL Ratio: 4.5 ratio (ref 0.0–5.0)
Cholesterol, Total: 131 mg/dL (ref 100–199)
HDL: 29 mg/dL — ABNORMAL LOW (ref >39)
LDL Chol Calc (NIH): 84 mg/dL (ref 0–99)
Triglycerides: 94 mg/dL (ref 0–149)
VLDL Cholesterol Cal: 18 mg/dL (ref 5–40)

## 2019-11-10 LAB — TSH: TSH: 1.15 u[IU]/mL (ref 0.450–4.500)

## 2019-11-16 ENCOUNTER — Telehealth: Payer: Self-pay | Admitting: Cardiology

## 2019-11-16 ENCOUNTER — Ambulatory Visit: Payer: Medicare Other

## 2019-11-16 ENCOUNTER — Other Ambulatory Visit: Payer: Self-pay

## 2019-11-16 DIAGNOSIS — M6281 Muscle weakness (generalized): Secondary | ICD-10-CM | POA: Diagnosis not present

## 2019-11-16 DIAGNOSIS — Z79899 Other long term (current) drug therapy: Secondary | ICD-10-CM

## 2019-11-16 DIAGNOSIS — R2681 Unsteadiness on feet: Secondary | ICD-10-CM

## 2019-11-16 MED ORDER — ATORVASTATIN CALCIUM 80 MG PO TABS
80.0000 mg | ORAL_TABLET | Freq: Every day | ORAL | 3 refills | Status: DC
Start: 2019-11-16 — End: 2020-07-15

## 2019-11-16 NOTE — Telephone Encounter (Signed)
Patient returning call.    Thanks

## 2019-11-16 NOTE — Therapy (Signed)
[facility] [address] [place], [place], [address] Phone: [phone]   Fax:  [phone]  Physical Therapy Treatment  Patient Details  Name: Grant Sparks MRN: [id number] Date of Birth: July 28, 1945 Referring Provider (PT): Dr. Lacretia Nicks   Encounter Date: 11/16/2019   PT End of Session - 11/16/19 1109    Visit Number 2    Number of Visits 24    Date for PT Re-Evaluation 01/29/20    Authorization Type Eval 11/06/19    Authorization Time Period 10 visit Medicare    Authorization - Visit Number 2    Authorization - Number of Visits 10    Progress Note Due on Visit 10    PT Start Time 1045    PT Stop Time 1130    PT Time Calculation (min) 45 min    Equipment Utilized During Treatment Gait belt    Activity Tolerance Patient tolerated treatment well;Patient limited by fatigue    Behavior During Therapy WFL for tasks assessed/performed           Past Medical History:  Diagnosis Date  . CAD (coronary artery disease)    S/P stenting of the RCA x2 and PTCA of a PDA lesion in Jan 2004. Pt had a myoracidal infarction when he was in rehabilitation following a CVA.  . CKD (chronic kidney disease), stage II   . CVA (cerebral vascular accident) (HCC)    Right  CVA, left hemiparesis  . Diabetes mellitus without complication (HCC)   . MI (myocardial infarction) (HCC)   . Pulmonary emboli [facility])     Past Surgical History:  Procedure Laterality Date  . APPENDECTOMY    . CATARACT EXTRACTION    . CERVICAL SPINE SURGERY    . coronary artery stent placement     2004    There were no vitals filed for this visit.   Subjective Assessment - 11/16/19 1109    Subjective Wife reports that he seems little stronger.    Patient is accompained by: Family member   Wife   Pertinent History : CVA(left sided weakness), MI, CAD, DM    Limitations Standing;Walking;House hold activities    How long can you sit comfortably? no  issues    How long can you stand comfortably? 5 min    How long can you walk comfortably? 5 min    Diagnostic tests 01/2018: MRI head: 1. No acute intracranial process or abnormal enhancement of thebrain.2. Stable chronic microvascular ischemic changes, volume loss of thebrain, and chronic pontine/left basal ganglia infarcts.3. Chronic expansile sphenoid sinus disease with low signal contentswhich may represent inspissation or fungal ball.    Patient Stated Goals Walk better, lift/carry 20lbs, be able to climb in and out of my tractor              [facility] PT Assessment - 11/16/19 1114      Standardized Balance Assessment   Standardized Balance Assessment Five Times Sit to Stand;Timed Up and Go Test    Five times sit to stand comments  29 seconds   no HHA (hands on knees)     Timed Up and Go Test   Normal TUG (seconds) 32   with walker         Treatment 10 x sit to stand using mat table: cues to extend the spine 5x sit to stand without HHA TUG performed Standing with one HHA on walker: fwd stepping and shifting weight to anterior leg and coming back  to normal stance: 2 x 10 R and L Walking program: pt is walking up to 10 min and doesn't feel significantly tired. Pt was advised to walk up to 12 or 15 min with walker at home to progress. Nustep: level 5 for 10'                          PT Short Term Goals - 11/16/19 1141      PT SHORT TERM GOAL #1   Title Patient will be able to ambulate 510 feet with RW in 6 minutes to improve walking endurance to improve community ambulation    Baseline 410 feet with RW in 6 MWT (eval)    Time 4    Period Weeks    Status New    Target Date 12/18/19      PT SHORT TERM GOAL #2   Title Pt will report compliance with walking program for at least 10 min with RW for 5days/week to improve walking endurance    Baseline no formal walking program    Time 4    Period Weeks    Status New    Target Date 12/18/19      PT SHORT  TERM GOAL #3   Title Patient will demo 0.15 m/s improvement in walking speed using the walker to improve walking speed and improve community ambulation    Baseline 0.34m/s with RW (Eval)    Time 4    Period Weeks    Status New    Target Date 12/18/19      PT SHORT TERM GOAL #4   Title Pt will be able to perform 5x sit to stand in 24 seconds or better without use of UE    Baseline 29 seconds (without UE) (11/16/19)    Time 4    Period Weeks    Status New    Target Date 12/18/19             PT Long Term Goals - 11/16/19 1140      PT LONG TERM GOAL #1   Title Patient will be able to carry 20 lbs in one hand with use of cane and ambulate about 100' to be able to carry bucket of feed in barn.    Baseline unable to carry weight    Time 12    Period Weeks    Status New      PT LONG TERM GOAL #2   Title Patient will be able to ambulate 1000' with RW on level surface to improve walking endurance with prefered assistive device    Baseline 410 feet with RW (Eval)    Time 12    Period Weeks    Status New      PT LONG TERM GOAL #3   Title Patient will be able to ambulate 300 feet on grass with quad cane to improve ability to walk in his barn    Baseline unable    Time 12    Period Weeks    Status New      PT LONG TERM GOAL #4   Title Patient will be able to climb up and down from his tractor safely to be able to operate his tractor to return to PLOF    Baseline unable to climb tractor since recent hospitalization    Time 12    Period Weeks    Status New      PT LONG TERM GOAL #5  Title Patient will improve his TUG time to 12 seconds or less with walker or LRAD to promote a decrease in fall risk.    Baseline 32 seconds with RW (11/16/19)    Time 12    Target Date 01/29/20                 Plan - 11/16/19 1109    Clinical Impression Statement Today's skilled session was focused on improving weight shifting anterior/posterlor and lateral Improving safe hand placement  with sit to stand transfers. New exercise program was established today.    Personal Factors and Comorbidities Comorbidity 2;Past/Current Experience    Comorbidities 2xCVA(left sided weakness), MI, CAD, DM    Examination-Activity Limitations Carry;Lift;Stairs;Squat;Stand;Locomotion Level    Examination-Participation Restrictions Cleaning;Community Activity;Laundry;Yard Work;Shop    Stability/Clinical Decision Making Evolving/Moderate complexity    Rehab Potential Good    Clinical Impairments Affecting Rehab Potential Hx of CVA, multiple health problems, weakness    PT Frequency 2x / week    PT Duration 12 weeks    PT Treatment/Interventions Therapeutic exercise;Therapeutic activities;Manual techniques;Aquatic Therapy;Balance training;Functional mobility training;Gait training;Neuromuscular re-education;Patient/family education;Dry needling;ADLs/Self Care Home Management;Electrical Stimulation;Moist Heat;Stair training;Orthotic Fit/Training;Passive range of motion;Energy conservation;Joint Manipulations    PT Next Visit Plan perform 5x sit to stand, TUG and add goals; issue HEP    PT Home Exercise Plan Walking program; Access Code: [email or web address]: [email or web address]: 09/20/2021Prepared by: Theone Murdoch PatelExercisesSit to Stand with Armchair - 1 x daily - 7 x weekly - 2 sets - 10 repsStride Stance Weight Shift - 1 x daily - 7 x weekly - 2 sets - 10 repsStanding Weight Shift Side to Side - 1 x daily - 7 x weekly - 2 sets - 10 reps    Consulted and Agree with Plan of Care Patient;Family member/caregiver    Family Member Consulted wife           Patient will benefit from skilled therapeutic intervention in order to improve the following deficits and impairments:  Abnormal gait, Cardiopulmonary status limiting activity, Decreased activity tolerance, Decreased balance, Decreased endurance, Decreased mobility, Decreased strength, Difficulty walking, Impaired flexibility, Postural  dysfunction  Visit Diagnosis: Muscle weakness (generalized)  Unsteadiness on feet     Problem List Patient Active Problem List   Diagnosis Date Noted  . Educated about COVID-19 virus infection 11/04/2019  . Bilateral pulmonary embolism (HCC) 10/22/2019  . History of CVA with residual deficit 10/22/2019  . Left leg DVT (HCC) 10/22/2019  . AKI (acute kidney injury) (HCC) 10/22/2019  . Fall at home, initial encounter 10/22/2019  . Cerebral thrombosis with cerebral infarction 10/14/2016  . Normocytic anemia 10/14/2016  . Facial droop 10/13/2016  . Benign essential hypertension 10/13/2016  . Controlled type 2 diabetes mellitus with stage 3 chronic kidney disease, with long-term current use of insulin (HCC) 10/13/2016  . CVA (cerebral vascular accident) (HCC)   . CAD 03/01/2008  . CORONARY ARTERY DISEASE, S/P PTCA 03/01/2008  . Cerebral artery occlusion with cerebral infarction [facility]) 03/01/2008    Ileana Ladd 11/16/2019, 11:42 AM  [facility] [address] [place], [place], [address] Phone: [phone]   Fax:  [phone]  Name: Grant Sparks MRN: [id number] Date of Birth: 1945-05-26

## 2019-11-16 NOTE — Telephone Encounter (Signed)
Wife updated with results and verbalized understanding.

## 2019-11-16 NOTE — Telephone Encounter (Signed)
Patient's wife returning call for lab results. 

## 2019-11-16 NOTE — Patient Instructions (Signed)
Access Code: [email or web address] URL: [email or web address] Date: 11/16/2019 Prepared by: Lavone Nian  Exercises Sit to Stand with Armchair - 1 x daily - 7 x weekly - 2 sets - 10 reps Stride Stance Weight Shift - 1 x daily - 7 x weekly - 2 sets - 10 reps Standing Weight Shift Side to Side - 1 x daily - 7 x weekly - 2 sets - 10 reps

## 2019-11-19 ENCOUNTER — Ambulatory Visit: Payer: Medicare Other

## 2019-11-19 ENCOUNTER — Other Ambulatory Visit: Payer: Self-pay

## 2019-11-19 DIAGNOSIS — M6281 Muscle weakness (generalized): Secondary | ICD-10-CM | POA: Diagnosis not present

## 2019-11-19 DIAGNOSIS — R2681 Unsteadiness on feet: Secondary | ICD-10-CM

## 2019-11-19 NOTE — Patient Instructions (Signed)
Access Code: [email or web address] URL: [email or web address] Date: 11/19/2019 Prepared by: Jethro Bastos  Exercises Sit to Stand with Armchair - 1 x daily - 7 x weekly - 2 sets - 10 reps Stride Stance Weight Shift - 1 x daily - 7 x weekly - 2 sets - 10 reps Standing Weight Shift Side to Side - 1 x daily - 7 x weekly - 2 sets - 10 reps Seated March with Resistance - 1 x daily - 7 x weekly - 2 sets - 10 reps

## 2019-11-19 NOTE — Therapy (Signed)
[facility] [address] [place], [place], [address] Phone: [phone]   Fax:  [phone]  Physical Therapy Treatment  Patient Details  Name: Grant Sparks MRN: [id number] Date of Birth: 1945-06-14 Referring Provider (PT): Dr. Lacretia Nicks   Encounter Date: 11/19/2019   PT End of Session - 11/19/19 1320    Visit Number 3    Number of Visits 24    Date for PT Re-Evaluation 01/29/20    Authorization Type Eval 11/06/19    Authorization Time Period 10 visit Medicare    Authorization - Visit Number 3    Authorization - Number of Visits 10    Progress Note Due on Visit 10    PT Start Time 1317    PT Stop Time 1359    PT Time Calculation (min) 42 min    Equipment Utilized During Treatment Gait belt    Activity Tolerance Patient tolerated treatment well;Patient limited by fatigue    Behavior During Therapy WFL for tasks assessed/performed           Past Medical History:  Diagnosis Date  . CAD (coronary artery disease)    S/P stenting of the RCA x2 and PTCA of a PDA lesion in Jan 2004. Pt had a myoracidal infarction when he was in rehabilitation following a CVA.  . CKD (chronic kidney disease), stage II   . CVA (cerebral vascular accident) (HCC)    Right  CVA, left hemiparesis  . Diabetes mellitus without complication (HCC)   . MI (myocardial infarction) (HCC)   . Pulmonary emboli [facility])     Past Surgical History:  Procedure Laterality Date  . APPENDECTOMY    . CATARACT EXTRACTION    . CERVICAL SPINE SURGERY    . coronary artery stent placement     2004    There were no vitals filed for this visit.   Subjective Assessment - 11/19/19 1321    Subjective Patient reports no new changes/complaint since last visit. No reports of falls or pain. Patient reports that has been walking daily, this morning about 15 minutes.    Patient is accompained by: Family member   Wife   Pertinent History : CVA(left sided  weakness), MI, CAD, DM    Limitations Standing;Walking;House hold activities    How long can you sit comfortably? no issues    How long can you stand comfortably? 5 min    How long can you walk comfortably? 5 min    Diagnostic tests 01/2018: MRI head: 1. No acute intracranial process or abnormal enhancement of thebrain.2. Stable chronic microvascular ischemic changes, volume loss of thebrain, and chronic pontine/left basal ganglia infarcts.3. Chronic expansile sphenoid sinus disease with low signal contentswhich may represent inspissation or fungal ball.    Patient Stated Goals Walk better, lift/carry 20lbs, be able to climb in and out of my tractor    Currently in Pain? No/denies                             [facility] Adult PT Treatment/Exercise - 11/19/19 0001      Transfers   Transfers Sit to Stand;Stand to Sit    Sit to Stand 5: Supervision    Stand to Sit 5: Supervision    Comments completed sit <> stand with BLE's placed on blue mat, completed with BUE 2 x 10 reps. superivison due to balance challenge upon standing.       Ambulation/Gait  Ambulation/Gait Yes    Ambulation/Gait Assistance 5: Supervision;4: Min guard    Ambulation/Gait Assistance Details Completed anbulation with RW, PT providing superivison, 1-2 instances of CGA due to patient catching L toe. Patietn reporting that he did not wear brace to sesion today. After ambulation: HR: 74, 02sat: 97%    Ambulation Distance (Feet) 345 Feet    Assistive device Rolling walker    Gait Pattern Decreased dorsiflexion - left;Decreased hip/knee flexion - left;Decreased stance time - left;Decreased step length - right;Left steppage;Left foot flat;Wide base of support;Trunk flexed;Poor foot clearance - left    Ambulation Surface Level;Indoor      High Level Balance   High Level Balance Activities Side stepping    High Level Balance Comments At countertop with BUE support: completed side stepping along countertop x 4 laps,  down and back. PT providing verbal cues to keep hips/feet aligned forward for proper muscle activation. Verbal cues for posture as well.       Exercises   Exercises Knee/Hip      Knee/Hip Exercises: Aerobic   Nustep Completed x 5 minutes with BLE to promote improved strengthening/endurance. Verbal cues to keep steps per minute > 40. Completed at Level 5. Patietn reporting mild fatigue in BLE upon compleiton      Knee/Hip Exercises: Seated   Marching Strengthening;Both;2 sets;10 reps;Limitations    Marching Limitations completed with red theraband around thighs, verbal cues for technique    Hamstring Curl Both;2 sets;10 reps;Limitations    Hamstring Limitations completed with red theraband, verbal cues for control. increased difficulty with knee flexion noted on LLE.                   PT Education - 11/19/19 1354    Education provided Yes    Education Details Educated on HEP Update    Person(s) Educated Patient;Spouse    Methods Explanation;Demonstration;Handout    Comprehension Verbalized understanding;Returned demonstration;Verbal cues required            PT Short Term Goals - 11/16/19 1141      PT SHORT TERM GOAL #1   Title Patient will be able to ambulate 510 feet with RW in 6 minutes to improve walking endurance to improve community ambulation    Baseline 410 feet with RW in 6 MWT (eval)    Time 4    Period Weeks    Status New    Target Date 12/18/19      PT SHORT TERM GOAL #2   Title Pt will report compliance with walking program for at least 10 min with RW for 5days/week to improve walking endurance    Baseline no formal walking program    Time 4    Period Weeks    Status New    Target Date 12/18/19      PT SHORT TERM GOAL #3   Title Patient will demo 0.15 m/s improvement in walking speed using the walker to improve walking speed and improve community ambulation    Baseline 0.39m/s with RW (Eval)    Time 4    Period Weeks    Status New    Target Date  12/18/19      PT SHORT TERM GOAL #4   Title Pt will be able to perform 5x sit to stand in 24 seconds or better without use of UE    Baseline 29 seconds (without UE) (11/16/19)    Time 4    Period Weeks    Status New  Target Date 12/18/19             PT Long Term Goals - 11/16/19 1140      PT LONG TERM GOAL #1   Title Patient will be able to carry 20 lbs in one hand with use of cane and ambulate about 100' to be able to carry bucket of feed in barn.    Baseline unable to carry weight    Time 12    Period Weeks    Status New      PT LONG TERM GOAL #2   Title Patient will be able to ambulate 1000' with RW on level surface to improve walking endurance with prefered assistive device    Baseline 410 feet with RW (Eval)    Time 12    Period Weeks    Status New      PT LONG TERM GOAL #3   Title Patient will be able to ambulate 300 feet on grass with quad cane to improve ability to walk in his barn    Baseline unable    Time 12    Period Weeks    Status New      PT LONG TERM GOAL #4   Title Patient will be able to climb up and down from his tractor safely to be able to operate his tractor to return to PLOF    Baseline unable to climb tractor since recent hospitalization    Time 12    Period Weeks    Status New      PT LONG TERM GOAL #5   Title Patient will improve his TUG time to 12 seconds or less with walker or LRAD to promote a decrease in fall risk.    Baseline 32 seconds with RW (11/16/19)    Time 12    Target Date 01/29/20                 Plan - 11/19/19 1410    Clinical Impression Statement Today's skilled PT session included continued gait training with AD today, patient demo decreased clearance on LLE as patient forgot to wear brace. Continued BLE strengthening exercises as tolerated by patient. Will continue to progress toward goals.    Personal Factors and Comorbidities Comorbidity 2;Past/Current Experience    Comorbidities 2xCVA(left sided weakness),  MI, CAD, DM    Examination-Activity Limitations Carry;Lift;Stairs;Squat;Stand;Locomotion Level    Examination-Participation Restrictions Cleaning;Community Activity;Laundry;Yard Work;Shop    Stability/Clinical Decision Making Evolving/Moderate complexity    Rehab Potential Good    Clinical Impairments Affecting Rehab Potential Hx of CVA, multiple health problems, weakness    PT Frequency 2x / week    PT Duration 12 weeks    PT Treatment/Interventions Therapeutic exercise;Therapeutic activities;Manual techniques;Aquatic Therapy;Balance training;Functional mobility training;Gait training;Neuromuscular re-education;Patient/family education;Dry needling;ADLs/Self Care Home Management;Electrical Stimulation;Moist Heat;Stair training;Orthotic Fit/Training;Passive range of motion;Energy conservation;Joint Manipulations    PT Next Visit Plan how was HEP addition? continue BLE strengthening (standing/seated). Standing balance    PT Home Exercise Plan Walking program; Access Code: [email or web address]: [email or web address]: 09/20/2021Prepared by: Theone Murdoch PatelExercisesSit to Stand with Armchair - 1 x daily - 7 x weekly - 2 sets - 10 repsStride Stance Weight Shift - 1 x daily - 7 x weekly - 2 sets - 10 repsStanding Weight Shift Side to Side - 1 x daily - 7 x weekly - 2 sets - 10 reps    Consulted and Agree with Plan of Care Patient;Family member/caregiver    Family Member Consulted wife  Patient will benefit from skilled therapeutic intervention in order to improve the following deficits and impairments:  Abnormal gait, Cardiopulmonary status limiting activity, Decreased activity tolerance, Decreased balance, Decreased endurance, Decreased mobility, Decreased strength, Difficulty walking, Impaired flexibility, Postural dysfunction  Visit Diagnosis: Muscle weakness (generalized)  Unsteadiness on feet     Problem List Patient Active Problem List   Diagnosis Date Noted  . Educated  about COVID-19 virus infection 11/04/2019  . Bilateral pulmonary embolism (HCC) 10/22/2019  . History of CVA with residual deficit 10/22/2019  . Left leg DVT (HCC) 10/22/2019  . AKI (acute kidney injury) (HCC) 10/22/2019  . Fall at home, initial encounter 10/22/2019  . Cerebral thrombosis with cerebral infarction 10/14/2016  . Normocytic anemia 10/14/2016  . Facial droop 10/13/2016  . Benign essential hypertension 10/13/2016  . Controlled type 2 diabetes mellitus with stage 3 chronic kidney disease, with long-term current use of insulin (HCC) 10/13/2016  . CVA (cerebral vascular accident) (HCC)   . CAD 03/01/2008  . CORONARY ARTERY DISEASE, S/P PTCA 03/01/2008  . Cerebral artery occlusion with cerebral infarction (HCC) 03/01/2008    Tempie DonningKaitlyn B Atharv Barriere, PT, DPT 11/19/2019, 2:13 PM  [facility] [address] [place], [place], [address] Phone: [phone]   Fax:  [phone]  Name: Grant Sparks MRN: [id number] Date of Birth: 28-Dec-1945

## 2019-11-23 ENCOUNTER — Ambulatory Visit: Payer: Medicare Other

## 2019-11-23 ENCOUNTER — Other Ambulatory Visit: Payer: Self-pay

## 2019-11-23 DIAGNOSIS — M6281 Muscle weakness (generalized): Secondary | ICD-10-CM | POA: Diagnosis not present

## 2019-11-23 NOTE — Therapy (Signed)
[facility] [address] [place], [place], [address] Phone: [phone]   Fax:  [phone]  Physical Therapy Treatment  Patient Details  Name: Grant Sparks MRN: [id number] Date of Birth: Jan 26, 1946 Referring Provider (PT): Dr. Lacretia Nicks   Encounter Date: 11/23/2019   PT End of Session - 11/23/19 1501    Visit Number 4    Number of Visits 24    Date for PT Re-Evaluation 01/29/20    Authorization Type Eval 11/06/19    Authorization Time Period 10 visit Medicare    Authorization - Visit Number 4    Authorization - Number of Visits 10    Progress Note Due on Visit 10    PT Start Time 1315    PT Stop Time 1400    PT Time Calculation (min) 45 min    Equipment Utilized During Treatment Gait belt    Activity Tolerance Patient tolerated treatment well;Patient limited by fatigue    Behavior During Therapy [facility] for tasks assessed/performed           Past Medical History:  Diagnosis Date  . CAD (coronary artery disease)    S/P stenting of the RCA x2 and PTCA of a PDA lesion in Jan 2004. Pt had a myoracidal infarction when he was in rehabilitation following a CVA.  . CKD (chronic kidney disease), stage II   . CVA (cerebral vascular accident) (HCC)    Right  CVA, left hemiparesis  . Diabetes mellitus without complication (HCC)   . MI (myocardial infarction) (HCC)   . Pulmonary emboli [facility])     Past Surgical History:  Procedure Laterality Date  . APPENDECTOMY    . CATARACT EXTRACTION    . CERVICAL SPINE SURGERY    . coronary artery stent placement     2004    There were no vitals filed for this visit.   Subjective Assessment - 11/23/19 1326    Subjective Pt reprots his leg was swollen and there was buldge around AFO> he brought his AFO in. Pt reports that he is not fond of exercises but he does enjoy walking.    Patient is accompained by: Family member   Wife   Pertinent History : CVA(left sided weakness),  MI, CAD, DM    Limitations Standing;Walking;House hold activities    How long can you sit comfortably? no issues    How long can you stand comfortably? 5 min    How long can you walk comfortably? 5 min    Diagnostic tests 01/2018: MRI head: 1. No acute intracranial process or abnormal enhancement of thebrain.2. Stable chronic microvascular ischemic changes, volume loss of thebrain, and chronic pontine/left basal ganglia infarcts.3. Chronic expansile sphenoid sinus disease with low signal contentswhich may represent inspissation or fungal ball.    Patient Stated Goals Walk better, lift/carry 20lbs, be able to climb in and out of my tractor              Sit to stand: 10lb KB: 10x, 5x, cues to not brace with knees against table Standing lumbar extension: 10x Partial tandem with head turns: 20x no HHA Gait training: st. Cane with wider tip: 1 x 220', cues for 2 point gait pattern and coordination Gait training: fwd and bwd walking: 3 x 10 feet, cues for 3 step walking when walking backwards                         PT Short  Term Goals - 11/16/19 1141      PT SHORT TERM GOAL #1   Title Patient will be able to ambulate 510 feet with RW in 6 minutes to improve walking endurance to improve community ambulation    Baseline 410 feet with RW in 6 MWT (eval)    Time 4    Period Weeks    Status New    Target Date 12/18/19      PT SHORT TERM GOAL #2   Title Pt will report compliance with walking program for at least 10 min with RW for 5days/week to improve walking endurance    Baseline no formal walking program    Time 4    Period Weeks    Status New    Target Date 12/18/19      PT SHORT TERM GOAL #3   Title Patient will demo 0.15 m/s improvement in walking speed using the walker to improve walking speed and improve community ambulation    Baseline 0.32m/s with RW (Eval)    Time 4    Period Weeks    Status New    Target Date 12/18/19      PT SHORT TERM GOAL #4    Title Pt will be able to perform 5x sit to stand in 24 seconds or better without use of UE    Baseline 29 seconds (without UE) (11/16/19)    Time 4    Period Weeks    Status New    Target Date 12/18/19             PT Long Term Goals - 11/16/19 1140      PT LONG TERM GOAL #1   Title Patient will be able to carry 20 lbs in one hand with use of cane and ambulate about 100' to be able to carry bucket of feed in barn.    Baseline unable to carry weight    Time 12    Period Weeks    Status New      PT LONG TERM GOAL #2   Title Patient will be able to ambulate 1000' with RW on level surface to improve walking endurance with prefered assistive device    Baseline 410 feet with RW (Eval)    Time 12    Period Weeks    Status New      PT LONG TERM GOAL #3   Title Patient will be able to ambulate 300 feet on grass with quad cane to improve ability to walk in his barn    Baseline unable    Time 12    Period Weeks    Status New      PT LONG TERM GOAL #4   Title Patient will be able to climb up and down from his tractor safely to be able to operate his tractor to return to PLOF    Baseline unable to climb tractor since recent hospitalization    Time 12    Period Weeks    Status New      PT LONG TERM GOAL #5   Title Patient will improve his TUG time to 12 seconds or less with walker or LRAD to promote a decrease in fall risk.    Baseline 32 seconds with RW (11/16/19)    Time 12    Target Date 01/29/20                 Plan - 11/23/19 1459    Clinical Impression Statement Today's session  was focused on education to manage L LE swelling. Patient was educated to be compliant with wearing compression socks on L LE to improve edema management. Pt was progressed from walker to st. cane today. patient needed verbal and tactile cues for proper coordination. Patient will require continued education to improve gait with LRAD.    Personal Factors and Comorbidities Comorbidity  2;Past/Current Experience    Comorbidities 2xCVA(left sided weakness), MI, CAD, DM    Examination-Activity Limitations Carry;Lift;Stairs;Squat;Stand;Locomotion Level    Examination-Participation Restrictions Cleaning;Community Activity;Laundry;Yard Work;Shop    Stability/Clinical Decision Making Evolving/Moderate complexity    Rehab Potential Good    Clinical Impairments Affecting Rehab Potential Hx of CVA, multiple health problems, weakness    PT Frequency 2x / week    PT Duration 12 weeks    PT Treatment/Interventions Therapeutic exercise;Therapeutic activities;Manual techniques;Aquatic Therapy;Balance training;Functional mobility training;Gait training;Neuromuscular re-education;Patient/family education;Dry needling;ADLs/Self Care Home Management;Electrical Stimulation;Moist Heat;Stair training;Orthotic Fit/Training;Passive range of motion;Energy conservation;Joint Manipulations    PT Next Visit Plan how was HEP addition? continue BLE strengthening (standing/seated). Standing balance    PT Home Exercise Plan Walking program; Access Code: [email or web address]: [email or web address]: 09/20/2021Prepared by: Theone Murdoch PatelExercisesSit to Stand with Armchair - 1 x daily - 7 x weekly - 2 sets - 10 repsStride Stance Weight Shift - 1 x daily - 7 x weekly - 2 sets - 10 repsStanding Weight Shift Side to Side - 1 x daily - 7 x weekly - 2 sets - 10 reps    Consulted and Agree with Plan of Care Patient;Family member/caregiver    Family Member Consulted wife           Patient will benefit from skilled therapeutic intervention in order to improve the following deficits and impairments:  Abnormal gait, Cardiopulmonary status limiting activity, Decreased activity tolerance, Decreased balance, Decreased endurance, Decreased mobility, Decreased strength, Difficulty walking, Impaired flexibility, Postural dysfunction  Visit Diagnosis: Muscle weakness (generalized)     Problem List Patient Active  Problem List   Diagnosis Date Noted  . Educated about COVID-19 virus infection 11/04/2019  . Bilateral pulmonary embolism (HCC) 10/22/2019  . History of CVA with residual deficit 10/22/2019  . Left leg DVT (HCC) 10/22/2019  . AKI (acute kidney injury) (HCC) 10/22/2019  . Fall at home, initial encounter 10/22/2019  . Cerebral thrombosis with cerebral infarction 10/14/2016  . Normocytic anemia 10/14/2016  . Facial droop 10/13/2016  . Benign essential hypertension 10/13/2016  . Controlled type 2 diabetes mellitus with stage 3 chronic kidney disease, with long-term current use of insulin (HCC) 10/13/2016  . CVA (cerebral vascular accident) (HCC)   . CAD 03/01/2008  . CORONARY ARTERY DISEASE, S/P PTCA 03/01/2008  . Cerebral artery occlusion with cerebral infarction [facility]) 03/01/2008    Ileana Ladd 11/23/2019, 3:03 PM  [facility] [address] [place], [place], [address] Phone: [phone]   Fax:  [phone]  Name: Grant Sparks MRN: [id number] Date of Birth: May 02, 1945

## 2019-11-26 ENCOUNTER — Ambulatory Visit: Payer: Medicare Other

## 2019-12-01 ENCOUNTER — Other Ambulatory Visit: Payer: Self-pay

## 2019-12-01 ENCOUNTER — Ambulatory Visit: Payer: Medicare Other | Attending: Internal Medicine

## 2019-12-01 DIAGNOSIS — R2681 Unsteadiness on feet: Secondary | ICD-10-CM | POA: Diagnosis present

## 2019-12-01 DIAGNOSIS — M6281 Muscle weakness (generalized): Secondary | ICD-10-CM | POA: Diagnosis not present

## 2019-12-01 NOTE — Therapy (Signed)
[facility] [address] [place], [place], [address] Phone: [phone]   Fax:  [phone]  Physical Therapy Treatment  Patient Details  Name: Grant Sparks MRN: [id number] Date of Birth: 17-Nov-1945 Referring Provider (PT): Dr. Lacretia Nicks   Encounter Date: 12/01/2019   PT End of Session - 12/01/19 1347    Visit Number 5    Number of Visits 24    Date for PT Re-Evaluation 01/29/20    Authorization Type Eval 11/06/19    Authorization Time Period 10 visit Medicare    Authorization - Visit Number 5    Authorization - Number of Visits 10    Progress Note Due on Visit 10    PT Start Time 1315    PT Stop Time 1400    PT Time Calculation (min) 45 min    Equipment Utilized During Treatment Gait belt    Activity Tolerance Patient tolerated treatment well;Patient limited by fatigue    Behavior During Therapy [facility] for tasks assessed/performed           Past Medical History:  Diagnosis Date  . CAD (coronary artery disease)    S/P stenting of the RCA x2 and PTCA of a PDA lesion in Jan 2004. Pt had a myoracidal infarction when he was in rehabilitation following a CVA.  . CKD (chronic kidney disease), stage II   . CVA (cerebral vascular accident) (HCC)    Right  CVA, left hemiparesis  . Diabetes mellitus without complication (HCC)   . MI (myocardial infarction) (HCC)   . Pulmonary emboli [facility])     Past Surgical History:  Procedure Laterality Date  . APPENDECTOMY    . CATARACT EXTRACTION    . CERVICAL SPINE SURGERY    . coronary artery stent placement     2004    There were no vitals filed for this visit.   Subjective Assessment - 12/01/19 1335    Subjective Symptoms are stable. He tried walking with quad cane but doesn't feel safe yet.    Patient is accompained by: Family member   Wife   Pertinent History : CVA(left sided weakness), MI, CAD, DM    Limitations Standing;Walking;House hold activities    How  long can you sit comfortably? no issues    How long can you stand comfortably? 5 min    How long can you walk comfortably? 5 min    Diagnostic tests 01/2018: MRI head: 1. No acute intracranial process or abnormal enhancement of thebrain.2. Stable chronic microvascular ischemic changes, volume loss of thebrain, and chronic pontine/left basal ganglia infarcts.3. Chronic expansile sphenoid sinus disease with low signal contentswhich may represent inspissation or fungal ball.    Patient Stated Goals Walk better, lift/carry 20lbs, be able to climb in and out of my tractor                 Gait training: 2 x 210' with [place] cane. Cues to perform 3 pt gait pattern due to inconsistent stepping pattern with cane and onccaisonal unsteadiness, pt required CGA, cues to look 10-15' eahed.  1 x 20 feet of walking on gravel, mulch and grass each: with [place] cane and CGA                        PT Short Term Goals - 11/16/19 1141      PT SHORT TERM GOAL #1   Title Patient will be able to ambulate 510  feet with RW in 6 minutes to improve walking endurance to improve community ambulation    Baseline 410 feet with RW in 6 MWT (eval)    Time 4    Period Weeks    Status New    Target Date 12/18/19      PT SHORT TERM GOAL #2   Title Pt will report compliance with walking program for at least 10 min with RW for 5days/week to improve walking endurance    Baseline no formal walking program    Time 4    Period Weeks    Status New    Target Date 12/18/19      PT SHORT TERM GOAL #3   Title Patient will demo 0.15 m/s improvement in walking speed using the walker to improve walking speed and improve community ambulation    Baseline 0.74m/s with RW (Eval)    Time 4    Period Weeks    Status New    Target Date 12/18/19      PT SHORT TERM GOAL #4   Title Pt will be able to perform 5x sit to stand in 24 seconds or better without use of UE    Baseline 29 seconds (without UE)  (11/16/19)    Time 4    Period Weeks    Status New    Target Date 12/18/19             PT Long Term Goals - 11/16/19 1140      PT LONG TERM GOAL #1   Title Patient will be able to carry 20 lbs in one hand with use of cane and ambulate about 100' to be able to carry bucket of feed in barn.    Baseline unable to carry weight    Time 12    Period Weeks    Status New      PT LONG TERM GOAL #2   Title Patient will be able to ambulate 1000' with RW on level surface to improve walking endurance with prefered assistive device    Baseline 410 feet with RW (Eval)    Time 12    Period Weeks    Status New      PT LONG TERM GOAL #3   Title Patient will be able to ambulate 300 feet on grass with quad cane to improve ability to walk in his barn    Baseline unable    Time 12    Period Weeks    Status New      PT LONG TERM GOAL #4   Title Patient will be able to climb up and down from his tractor safely to be able to operate his tractor to return to PLOF    Baseline unable to climb tractor since recent hospitalization    Time 12    Period Weeks    Status New      PT LONG TERM GOAL #5   Title Patient will improve his TUG time to 12 seconds or less with walker or LRAD to promote a decrease in fall risk.    Baseline 32 seconds with RW (11/16/19)    Time 12    Target Date 01/29/20                 Plan - 12/01/19 1335    Clinical Impression Statement Today's skilled session was focused on continued progressiong of gait with LRAD> pt was able to ambulate 210' with st. cane but needed cueing to sequence. Pt  yet cannot not do 2 pt gait pattern and is inconsistent with 3 pt gait pattern. Needs occaisonal cues to slow down when walking. We progressed walking on non compliant surfaces.    Personal Factors and Comorbidities Comorbidity 2;Past/Current Experience    Comorbidities 2xCVA(left sided weakness), MI, CAD, DM    Examination-Activity Limitations  Carry;Lift;Stairs;Squat;Stand;Locomotion Level    Examination-Participation Restrictions Cleaning;Community Activity;Laundry;Yard Work;Shop    Stability/Clinical Decision Making Evolving/Moderate complexity    Rehab Potential Good    Clinical Impairments Affecting Rehab Potential Hx of CVA, multiple health problems, weakness    PT Frequency 2x / week    PT Duration 12 weeks    PT Treatment/Interventions Therapeutic exercise;Therapeutic activities;Manual techniques;Aquatic Therapy;Balance training;Functional mobility training;Gait training;Neuromuscular re-education;Patient/family education;Dry needling;ADLs/Self Care Home Management;Electrical Stimulation;Moist Heat;Stair training;Orthotic Fit/Training;Passive range of motion;Energy conservation;Joint Manipulations    PT Next Visit Plan how was HEP addition? continue BLE strengthening (standing/seated). Standing balance    PT Home Exercise Plan Walking program; Access Code: [email or web address]: [email or web address]: 09/20/2021Prepared by: Theone Murdoch PatelExercisesSit to Stand with Armchair - 1 x daily - 7 x weekly - 2 sets - 10 repsStride Stance Weight Shift - 1 x daily - 7 x weekly - 2 sets - 10 repsStanding Weight Shift Side to Side - 1 x daily - 7 x weekly - 2 sets - 10 reps    Consulted and Agree with Plan of Care Patient;Family member/caregiver    Family Member Consulted wife           Patient will benefit from skilled therapeutic intervention in order to improve the following deficits and impairments:  Abnormal gait, Cardiopulmonary status limiting activity, Decreased activity tolerance, Decreased balance, Decreased endurance, Decreased mobility, Decreased strength, Difficulty walking, Impaired flexibility, Postural dysfunction  Visit Diagnosis: Muscle weakness (generalized)  Unsteadiness on feet     Problem List Patient Active Problem List   Diagnosis Date Noted  . Educated about COVID-19 virus infection 11/04/2019  .  Bilateral pulmonary embolism (HCC) 10/22/2019  . History of CVA with residual deficit 10/22/2019  . Left leg DVT (HCC) 10/22/2019  . AKI (acute kidney injury) (HCC) 10/22/2019  . Fall at home, initial encounter 10/22/2019  . Cerebral thrombosis with cerebral infarction 10/14/2016  . Normocytic anemia 10/14/2016  . Facial droop 10/13/2016  . Benign essential hypertension 10/13/2016  . Controlled type 2 diabetes mellitus with stage 3 chronic kidney disease, with long-term current use of insulin (HCC) 10/13/2016  . CVA (cerebral vascular accident) (HCC)   . CAD 03/01/2008  . CORONARY ARTERY DISEASE, S/P PTCA 03/01/2008  . Cerebral artery occlusion with cerebral infarction [facility]) 03/01/2008    Ileana Ladd 12/01/2019, 1:49 PM  [facility] [address] [place], [place], [address] Phone: [phone]   Fax:  [phone]  Name: RAEKWON WINKOWSKI MRN: [id number] Date of Birth: December 12, 1945

## 2019-12-03 ENCOUNTER — Other Ambulatory Visit: Payer: Self-pay

## 2019-12-03 ENCOUNTER — Ambulatory Visit: Payer: Medicare Other

## 2019-12-03 DIAGNOSIS — R2681 Unsteadiness on feet: Secondary | ICD-10-CM

## 2019-12-03 DIAGNOSIS — M6281 Muscle weakness (generalized): Secondary | ICD-10-CM | POA: Diagnosis not present

## 2019-12-03 NOTE — Therapy (Signed)
[facility] [address] [place], [place], [address] Phone: [phone]   Fax:  [phone]  Physical Therapy Treatment  Patient Details  Name: Grant Sparks MRN: [id number] Date of Birth: 01/24/1946 Referring Provider (PT): Dr. Lacretia Nicks   Encounter Date: 12/03/2019   PT End of Session - 12/03/19 1152    Visit Number 6    Number of Visits 24    Date for PT Re-Evaluation 01/29/20    Authorization Type Eval 11/06/19    Authorization Time Period 10 visit Medicare    Authorization - Visit Number 5    Authorization - Number of Visits 10    Progress Note Due on Visit 10    PT Start Time 1147    PT Stop Time 1229    PT Time Calculation (min) 42 min    Equipment Utilized During Treatment Gait belt    Activity Tolerance Patient tolerated treatment well;Patient limited by fatigue    Behavior During Therapy WFL for tasks assessed/performed           Past Medical History:  Diagnosis Date  . CAD (coronary artery disease)    S/P stenting of the RCA x2 and PTCA of a PDA lesion in Jan 2004. Pt had a myoracidal infarction when he was in rehabilitation following a CVA.  . CKD (chronic kidney disease), stage II   . CVA (cerebral vascular accident) (HCC)    Right  CVA, left hemiparesis  . Diabetes mellitus without complication (HCC)   . MI (myocardial infarction) (HCC)   . Pulmonary emboli [facility])     Past Surgical History:  Procedure Laterality Date  . APPENDECTOMY    . CATARACT EXTRACTION    . CERVICAL SPINE SURGERY    . coronary artery stent placement     2004    There were no vitals filed for this visit.   Subjective Assessment - 12/03/19 1151    Subjective No new changes/complaints since last visit. Reports he is up to 25 minutes with walk.    Patient is accompained by: Family member   Wife   Pertinent History : CVA(left sided weakness), MI, CAD, DM    Limitations Standing;Walking;House hold activities     How long can you sit comfortably? no issues    How long can you stand comfortably? 5 min    How long can you walk comfortably? 5 min    Diagnostic tests 01/2018: MRI head: 1. No acute intracranial process or abnormal enhancement of thebrain.2. Stable chronic microvascular ischemic changes, volume loss of thebrain, and chronic pontine/left basal ganglia infarcts.3. Chronic expansile sphenoid sinus disease with low signal contentswhich may represent inspissation or fungal ball.    Patient Stated Goals Walk better, lift/carry 20lbs, be able to climb in and out of my tractor    Currently in Pain? No/denies                             [facility] Adult PT Treatment/Exercise - 12/03/19 0001      Transfers   Transfers Sit to Stand;Stand to Sit    Sit to Stand 5: Supervision    Stand to Sit 5: Supervision      Ambulation/Gait   Ambulation/Gait Yes    Ambulation/Gait Assistance 5: Supervision;4: Min guard    Ambulation/Gait Assistance Details Completed gait training with various AD to determine best option for patient. Completed ambulation x 115 ft with hurrycane as patient/wife  reports this is what he has at home. PT requiring increased CGA during completion and demonstrated increased balance challenge with ambulation with this device. Followed up with gait training x 115 ft with [address]. Patient reports improved stability and PT reports improved safety with this AD. PT providing name of cane and purchase options for patient to purchase personal small base quad cane.     Ambulation Distance (Feet) 115 Feet   x 3   Assistive device Small based quad cane   Hurry Cane   Gait Pattern Decreased dorsiflexion - left;Decreased hip/knee flexion - left;Decreased stance time - left;Decreased step length - right;Left steppage;Left foot flat;Wide base of support;Trunk flexed;Poor foot clearance - left    Ambulation Surface Level;Indoor      Exercises   Exercises Knee/Hip      Knee/Hip  Exercises: Aerobic   Nustep Completed x 7 minutes with BLE/BUE to promote endurance and activity tolerance. Verbal cues to keep steps per minutes > 45. Level 4.                     PT Short Term Goals - 11/16/19 1141      PT SHORT TERM GOAL #1   Title Patient will be able to ambulate 510 feet with RW in 6 minutes to improve walking endurance to improve community ambulation    Baseline 410 feet with RW in 6 MWT (eval)    Time 4    Period Weeks    Status New    Target Date 12/18/19      PT SHORT TERM GOAL #2   Title Pt will report compliance with walking program for at least 10 min with RW for 5days/week to improve walking endurance    Baseline no formal walking program    Time 4    Period Weeks    Status New    Target Date 12/18/19      PT SHORT TERM GOAL #3   Title Patient will demo 0.15 m/s improvement in walking speed using the walker to improve walking speed and improve community ambulation    Baseline 0.9m/s with RW (Eval)    Time 4    Period Weeks    Status New    Target Date 12/18/19      PT SHORT TERM GOAL #4   Title Pt will be able to perform 5x sit to stand in 24 seconds or better without use of UE    Baseline 29 seconds (without UE) (11/16/19)    Time 4    Period Weeks    Status New    Target Date 12/18/19             PT Long Term Goals - 11/16/19 1140      PT LONG TERM GOAL #1   Title Patient will be able to carry 20 lbs in one hand with use of cane and ambulate about 100' to be able to carry bucket of feed in barn.    Baseline unable to carry weight    Time 12    Period Weeks    Status New      PT LONG TERM GOAL #2   Title Patient will be able to ambulate 1000' with RW on level surface to improve walking endurance with prefered assistive device    Baseline 410 feet with RW (Eval)    Time 12    Period Weeks    Status New      PT  LONG TERM GOAL #3   Title Patient will be able to ambulate 300 feet on grass with quad cane to improve  ability to walk in his barn    Baseline unable    Time 12    Period Weeks    Status New      PT LONG TERM GOAL #4   Title Patient will be able to climb up and down from his tractor safely to be able to operate his tractor to return to PLOF    Baseline unable to climb tractor since recent hospitalization    Time 12    Period Weeks    Status New      PT LONG TERM GOAL #5   Title Patient will improve his TUG time to 12 seconds or less with walker or LRAD to promote a decrease in fall risk.    Baseline 32 seconds with RW (11/16/19)    Time 12    Target Date 01/29/20                 Plan - 12/03/19 1222    Clinical Impression Statement Today's skilled PT session included gait trianing with various AD including hurry cane and small base quad cane. Patient demo improved stability with [address]ane and PT providing purhcase options. Will continue to progress toward all goals.    Personal Factors and Comorbidities Comorbidity 2;Past/Current Experience    Comorbidities 2xCVA(left sided weakness), MI, CAD, DM    Examination-Activity Limitations Carry;Lift;Stairs;Squat;Stand;Locomotion Level    Examination-Participation Restrictions Cleaning;Community Activity;Laundry;Yard Work;Shop    Stability/Clinical Decision Making Evolving/Moderate complexity    Rehab Potential Good    Clinical Impairments Affecting Rehab Potential Hx of CVA, multiple health problems, weakness    PT Frequency 2x / week    PT Duration 12 weeks    PT Treatment/Interventions Therapeutic exercise;Therapeutic activities;Manual techniques;Aquatic Therapy;Balance training;Functional mobility training;Gait training;Neuromuscular re-education;Patient/family education;Dry needling;ADLs/Self Care Home Management;Electrical Stimulation;Moist Heat;Stair training;Orthotic Fit/Training;Passive range of motion;Energy conservation;Joint Manipulations    PT Next Visit Plan did we get new cane? adjust height if needed and  continue gait training. nustep for endurance.  continue BLE strengthening (standing/seated). Standing balance    PT Home Exercise Plan Walking program; Access Code: [email or web address]: [email or web address]: 09/20/2021Prepared by: Theone MurdochKarmesh PatelExercisesSit to Stand with Armchair - 1 x daily - 7 x weekly - 2 sets - 10 repsStride Stance Weight Shift - 1 x daily - 7 x weekly - 2 sets - 10 repsStanding Weight Shift Side to Side - 1 x daily - 7 x weekly - 2 sets - 10 reps    Consulted and Agree with Plan of Care Patient;Family member/caregiver    Family Member Consulted wife           Patient will benefit from skilled therapeutic intervention in order to improve the following deficits and impairments:  Abnormal gait, Cardiopulmonary status limiting activity, Decreased activity tolerance, Decreased balance, Decreased endurance, Decreased mobility, Decreased strength, Difficulty walking, Impaired flexibility, Postural dysfunction  Visit Diagnosis: Muscle weakness (generalized)  Unsteadiness on feet     Problem List Patient Active Problem List   Diagnosis Date Noted  . Educated about COVID-19 virus infection 11/04/2019  . Bilateral pulmonary embolism (HCC) 10/22/2019  . History of CVA with residual deficit 10/22/2019  . Left leg DVT (HCC) 10/22/2019  . AKI (acute kidney injury) (HCC) 10/22/2019  . Fall at home, initial encounter 10/22/2019  . Cerebral thrombosis with cerebral infarction 10/14/2016  . Normocytic anemia 10/14/2016  . Facial droop  10/13/2016  . Benign essential hypertension 10/13/2016  . Controlled type 2 diabetes mellitus with stage 3 chronic kidney disease, with long-term current use of insulin (HCC) 10/13/2016  . CVA (cerebral vascular accident) (HCC)   . CAD 03/01/2008  . CORONARY ARTERY DISEASE, S/P PTCA 03/01/2008  . Cerebral artery occlusion with cerebral infarction (HCC) 03/01/2008    Tempie Donning, PT, DPT 12/03/2019, 12:31 PM  [facility] [address] [place], [place], [address] Phone: [phone]   Fax:  [phone]  Name: CESAREO VICKREY MRN: [id number] Date of Birth: [birth year]

## 2019-12-07 ENCOUNTER — Ambulatory Visit: Payer: Medicare Other

## 2019-12-07 ENCOUNTER — Other Ambulatory Visit: Payer: Self-pay

## 2019-12-07 DIAGNOSIS — M6281 Muscle weakness (generalized): Secondary | ICD-10-CM

## 2019-12-07 DIAGNOSIS — R2681 Unsteadiness on feet: Secondary | ICD-10-CM

## 2019-12-07 NOTE — Therapy (Signed)
[facility] [address] [place], [place], [address] Phone: [phone]   Fax:  [phone]  Physical Therapy Treatment  Patient Details  Name: Grant Sparks MRN: [id number] Date of Birth: [birth year] Referring Provider (PT): Dr. Lacretia Nicksaldwell Powell   Encounter Date: 12/07/2019   PT End of Session - 12/07/19 1407    Visit Number 7    Number of Visits 24    Date for PT Re-Evaluation 01/29/20    Authorization Type Eval 11/06/19    Authorization Time Period 10 visit Medicare    Authorization - Visit Number --    Authorization - Number of Visits --    Progress Note Due on Visit 10    PT Start Time 1402    PT Stop Time 1445    PT Time Calculation (min) 43 min    Equipment Utilized During Treatment Gait belt    Activity Tolerance Patient tolerated treatment well;Patient limited by fatigue    Behavior During Therapy WFL for tasks assessed/performed           Past Medical History:  Diagnosis Date  . CAD (coronary artery disease)    S/P stenting of the RCA x2 and PTCA of a PDA lesion in Jan 2004. Pt had a myoracidal infarction when he was in rehabilitation following a CVA.  . CKD (chronic kidney disease), stage II   . CVA (cerebral vascular accident) (HCC)    Right  CVA, left hemiparesis  . Diabetes mellitus without complication (HCC)   . MI (myocardial infarction) (HCC)   . Pulmonary emboli [facility](HCC)     Past Surgical History:  Procedure Laterality Date  . APPENDECTOMY    . CATARACT EXTRACTION    . CERVICAL SPINE SURGERY    . coronary artery stent placement     2004    There were no vitals filed for this visit.   Subjective Assessment - 12/07/19 1406    Subjective No new changes/complaints. Patient reports walking up to 30 minutes. No falls.    Patient is accompained by: Family member   Wife   Pertinent History : CVA(left sided weakness), MI, CAD, DM    Limitations Standing;Walking;House hold activities    How  long can you sit comfortably? no issues    How long can you stand comfortably? 5 min    How long can you walk comfortably? 5 min    Diagnostic tests 01/2018: MRI head: 1. No acute intracranial process or abnormal enhancement of thebrain.2. Stable chronic microvascular ischemic changes, volume loss of thebrain, and chronic pontine/left basal ganglia infarcts.3. Chronic expansile sphenoid sinus disease with low signal contentswhich may represent inspissation or fungal ball.    Patient Stated Goals Walk better, lift/carry 20lbs, be able to climb in and out of my tractor    Currently in Pain? No/denies                             [facility] Adult PT Treatment/Exercise - 12/07/19 0001      Ambulation/Gait   Ambulation/Gait Yes    Ambulation/Gait Assistance 4: Min guard    Ambulation/Gait Assistance Details continued gait training with small base quad cane x 230 ft. 1-2 instances of imbalance requiring increased support from PT to maintain balance. verbal cues for improved step length.     Ambulation Distance (Feet) 230 Feet    Assistive device Small based quad cane    Gait Pattern Decreased dorsiflexion -  left;Decreased hip/knee flexion - left;Decreased stance time - left;Decreased step length - right;Left steppage;Left foot flat;Wide base of support;Trunk flexed;Poor foot clearance - left    Ambulation Surface Level;Indoor      Neuro Re-ed    Neuro Re-ed Details  Standing in // bars with BUE support completing alternating steps forward/backward over black beam x 10 reps each, progressed to no UE support x 5 reps each. increased balance challenge noted without UE support, requiring CGA from PT. In staggered stance on firm surface, completing ant/post weight shifts x 10 reps, and progresed to completing 3 x 30 secs with visoin removed. Increased sway noted with EC. Progressed to standing on airex pad with eyes open and wide BOS, completing horizontal/vertical head turns x 10 reps each  direction. PT providing verbal/tactile cues for improved posture.       Exercises   Exercises Knee/Hip      Knee/Hip Exercises: Aerobic   Nustep Completed x 8 minutes with BLE to promote improved strengthening/endurance. Verbal cues for improved pace during completion. Completed at Level 5.                     PT Short Term Goals - 11/16/19 1141      PT SHORT TERM GOAL #1   Title Patient will be able to ambulate 510 feet with RW in 6 minutes to improve walking endurance to improve community ambulation    Baseline 410 feet with RW in 6 MWT (eval)    Time 4    Period Weeks    Status New    Target Date 12/18/19      PT SHORT TERM GOAL #2   Title Pt will report compliance with walking program for at least 10 min with RW for 5days/week to improve walking endurance    Baseline no formal walking program    Time 4    Period Weeks    Status New    Target Date 12/18/19      PT SHORT TERM GOAL #3   Title Patient will demo 0.15 m/s improvement in walking speed using the walker to improve walking speed and improve community ambulation    Baseline 0.61m/s with RW (Eval)    Time 4    Period Weeks    Status New    Target Date 12/18/19      PT SHORT TERM GOAL #4   Title Pt will be able to perform 5x sit to stand in 24 seconds or better without use of UE    Baseline 29 seconds (without UE) (11/16/19)    Time 4    Period Weeks    Status New    Target Date 12/18/19             PT Long Term Goals - 11/16/19 1140      PT LONG TERM GOAL #1   Title Patient will be able to carry 20 lbs in one hand with use of cane and ambulate about 100' to be able to carry bucket of feed in barn.    Baseline unable to carry weight    Time 12    Period Weeks    Status New      PT LONG TERM GOAL #2   Title Patient will be able to ambulate 1000' with RW on level surface to improve walking endurance with prefered assistive device    Baseline 410 feet with RW (Eval)    Time 12    Period  Weeks  Status New      PT LONG TERM GOAL #3   Title Patient will be able to ambulate 300 feet on grass with quad cane to improve ability to walk in his barn    Baseline unable    Time 12    Period Weeks    Status New      PT LONG TERM GOAL #4   Title Patient will be able to climb up and down from his tractor safely to be able to operate his tractor to return to PLOF    Baseline unable to climb tractor since recent hospitalization    Time 12    Period Weeks    Status New      PT LONG TERM GOAL #5   Title Patient will improve his TUG time to 12 seconds or less with walker or LRAD to promote a decrease in fall risk.    Baseline 32 seconds with RW (11/16/19)    Time 12    Target Date 01/29/20                 Plan - 12/07/19 1455    Clinical Impression Statement Today's skilled PT session included gait training with small base quad cane. Completed NMR focused on improved weight shift and step length with decreased UE support. Continue activites to promote endurance. Will continue to progress toward all goals.    Personal Factors and Comorbidities Comorbidity 2;Past/Current Experience    Comorbidities 2xCVA(left sided weakness), MI, CAD, DM    Examination-Activity Limitations Carry;Lift;Stairs;Squat;Stand;Locomotion Level    Examination-Participation Restrictions Cleaning;Community Activity;Laundry;Yard Work;Shop    Stability/Clinical Decision Making Evolving/Moderate complexity    Rehab Potential Good    Clinical Impairments Affecting Rehab Potential Hx of CVA, multiple health problems, weakness    PT Frequency 2x / week    PT Duration 12 weeks    PT Treatment/Interventions Therapeutic exercise;Therapeutic activities;Manual techniques;Aquatic Therapy;Balance training;Functional mobility training;Gait training;Neuromuscular re-education;Patient/family education;Dry needling;ADLs/Self Care Home Management;Electrical Stimulation;Moist Heat;Stair training;Orthotic  Fit/Training;Passive range of motion;Energy conservation;Joint Manipulations    PT Next Visit Plan continue gait training. nustep for endurance.  continue BLE strengthening (standing/seated). Standing balance    PT Home Exercise Plan Walking program; Access Code: [email or web address]: [email or web address]: 09/20/2021Prepared by: Theone Murdoch PatelExercisesSit to Stand with Armchair - 1 x daily - 7 x weekly - 2 sets - 10 repsStride Stance Weight Shift - 1 x daily - 7 x weekly - 2 sets - 10 repsStanding Weight Shift Side to Side - 1 x daily - 7 x weekly - 2 sets - 10 reps    Consulted and Agree with Plan of Care Patient;Family member/caregiver    Family Member Consulted wife           Patient will benefit from skilled therapeutic intervention in order to improve the following deficits and impairments:  Abnormal gait, Cardiopulmonary status limiting activity, Decreased activity tolerance, Decreased balance, Decreased endurance, Decreased mobility, Decreased strength, Difficulty walking, Impaired flexibility, Postural dysfunction  Visit Diagnosis: Muscle weakness (generalized)  Unsteadiness on feet     Problem List Patient Active Problem List   Diagnosis Date Noted  . Educated about COVID-19 virus infection 11/04/2019  . Bilateral pulmonary embolism (HCC) 10/22/2019  . History of CVA with residual deficit 10/22/2019  . Left leg DVT (HCC) 10/22/2019  . AKI (acute kidney injury) (HCC) 10/22/2019  . Fall at home, initial encounter 10/22/2019  . Cerebral thrombosis with cerebral infarction 10/14/2016  . Normocytic anemia 10/14/2016  . Facial droop 10/13/2016  . Benign  essential hypertension 10/13/2016  . Controlled type 2 diabetes mellitus with stage 3 chronic kidney disease, with long-term current use of insulin (HCC) 10/13/2016  . CVA (cerebral vascular accident) (HCC)   . CAD 03/01/2008  . CORONARY ARTERY DISEASE, S/P PTCA 03/01/2008  . Cerebral artery occlusion with cerebral  infarction (HCC) 03/01/2008    Tempie Donning, PT, DPT 12/07/2019, 2:56 PM  [facility] [address] [place], [place], [address] Phone: [phone]   Fax:  [phone]  Name: Grant Sparks MRN: [id number] Date of Birth: 1945-07-05

## 2019-12-09 ENCOUNTER — Ambulatory Visit: Payer: Medicare Other

## 2019-12-09 ENCOUNTER — Other Ambulatory Visit: Payer: Self-pay

## 2019-12-09 DIAGNOSIS — M6281 Muscle weakness (generalized): Secondary | ICD-10-CM

## 2019-12-09 DIAGNOSIS — R2681 Unsteadiness on feet: Secondary | ICD-10-CM

## 2019-12-09 NOTE — Therapy (Signed)
[facility] [address] [place], [place], [address] Phone: [phone]   Fax:  [phone]  Physical Therapy Treatment  Patient Details  Name: Grant Sparks MRN: [id number] Date of Birth: 10-14-1945 Referring Provider (PT): Dr. Lacretia Nicks   Encounter Date: 12/09/2019   PT End of Session - 12/09/19 1236    Visit Number 8    Number of Visits 24    Date for PT Re-Evaluation 01/29/20    Authorization Type Eval 11/06/19    Authorization Time Period 10 visit Medicare    Progress Note Due on Visit 10    PT Start Time 1231    PT Stop Time 1314    PT Time Calculation (min) 43 min    Equipment Utilized During Treatment Gait belt    Activity Tolerance Patient tolerated treatment well;Patient limited by fatigue    Behavior During Therapy WFL for tasks assessed/performed           Past Medical History:  Diagnosis Date  . CAD (coronary artery disease)    S/P stenting of the RCA x2 and PTCA of a PDA lesion in Jan 2004. Pt had a myoracidal infarction when he was in rehabilitation following a CVA.  . CKD (chronic kidney disease), stage II   . CVA (cerebral vascular accident) (HCC)    Right  CVA, left hemiparesis  . Diabetes mellitus without complication (HCC)   . MI (myocardial infarction) (HCC)   . Pulmonary emboli [facility])     Past Surgical History:  Procedure Laterality Date  . APPENDECTOMY    . CATARACT EXTRACTION    . CERVICAL SPINE SURGERY    . coronary artery stent placement     2004    There were no vitals filed for this visit.   Subjective Assessment - 12/09/19 1235    Subjective Patient/wife reports that he walked for 30 minutes tihs morning. Feeling more tired today more than usual. No falls.    Patient is accompained by: Family member   Wife   Pertinent History : CVA(left sided weakness), MI, CAD, DM    Limitations Standing;Walking;House hold activities    How long can you sit comfortably? no issues     How long can you stand comfortably? 5 min    How long can you walk comfortably? 5 min    Diagnostic tests 01/2018: MRI head: 1. No acute intracranial process or abnormal enhancement of thebrain.2. Stable chronic microvascular ischemic changes, volume loss of thebrain, and chronic pontine/left basal ganglia infarcts.3. Chronic expansile sphenoid sinus disease with low signal contentswhich may represent inspissation or fungal ball.    Patient Stated Goals Walk better, lift/carry 20lbs, be able to climb in and out of my tractor    Currently in Pain? No/denies                          [facility] Adult PT Treatment/Exercise - 12/09/19 0001      Transfers   Transfers Sit to Stand;Stand to Sit    Sit to Stand 5: Supervision    Stand to Sit 5: Supervision      Ambulation/Gait   Ambulation/Gait Yes    Ambulation/Gait Assistance 4: Min guard    Ambulation/Gait Assistance Details completed gait training on outdoor/indoor surfaces with RW, x 650 ft. Increased difficultly noted with walking up/down incline, especially with decline as patient demo dragging of LLE requiring CGA from PT. Patient demo mild recurvatum on LLE at  times even with AFO donned. Completed ambulation x 230 ft with wedge placed in L shoe to further promote improved gait pattern, minimal improvements noted. Mild fatigue after completion of ambulation requiring seated rest breaks intermittently.     Ambulation Distance (Feet) 650 Feet   x 1, 230 x 1   Assistive device Rolling walker    Gait Pattern Decreased dorsiflexion - left;Decreased hip/knee flexion - left;Decreased stance time - left;Decreased step length - right;Left steppage;Left foot flat;Wide base of support;Trunk flexed;Poor foot clearance - left    Ambulation Surface Level;Indoor;Outdoor;Paved    Gait Comments Prior to sitting on mat, PT educating to make sure stays within RW to promote improved safety.       Exercises   Exercises Knee/Hip      Knee/Hip  Exercises: Aerobic   Nustep Completed x 8 minutes with BLE to promote improved endurance. Increased verbal cues required to keep pace > 50. Completed at Level 5.                     PT Short Term Goals - 11/16/19 1141      PT SHORT TERM GOAL #1   Title Patient will be able to ambulate 510 feet with RW in 6 minutes to improve walking endurance to improve community ambulation    Baseline 410 feet with RW in 6 MWT (eval)    Time 4    Period Weeks    Status New    Target Date 12/18/19      PT SHORT TERM GOAL #2   Title Pt will report compliance with walking program for at least 10 min with RW for 5days/week to improve walking endurance    Baseline no formal walking program    Time 4    Period Weeks    Status New    Target Date 12/18/19      PT SHORT TERM GOAL #3   Title Patient will demo 0.15 m/s improvement in walking speed using the walker to improve walking speed and improve community ambulation    Baseline 0.6570m/s with RW (Eval)    Time 4    Period Weeks    Status New    Target Date 12/18/19      PT SHORT TERM GOAL #4   Title Pt will be able to perform 5x sit to stand in 24 seconds or better without use of UE    Baseline 29 seconds (without UE) (11/16/19)    Time 4    Period Weeks    Status New    Target Date 12/18/19             PT Long Term Goals - 11/16/19 1140      PT LONG TERM GOAL #1   Title Patient will be able to carry 20 lbs in one hand with use of cane and ambulate about 100' to be able to carry bucket of feed in barn.    Baseline unable to carry weight    Time 12    Period Weeks    Status New      PT LONG TERM GOAL #2   Title Patient will be able to ambulate 1000' with RW on level surface to improve walking endurance with prefered assistive device    Baseline 410 feet with RW (Eval)    Time 12    Period Weeks    Status New      PT LONG TERM GOAL #3   Title Patient will  be able to ambulate 300 feet on grass with quad cane to improve  ability to walk in his barn    Baseline unable    Time 12    Period Weeks    Status New      PT LONG TERM GOAL #4   Title Patient will be able to climb up and down from his tractor safely to be able to operate his tractor to return to PLOF    Baseline unable to climb tractor since recent hospitalization    Time 12    Period Weeks    Status New      PT LONG TERM GOAL #5   Title Patient will improve his TUG time to 12 seconds or less with walker or LRAD to promote a decrease in fall risk.    Baseline 32 seconds with RW (11/16/19)    Time 12    Target Date 01/29/20                 Plan - 12/09/19 1308    Clinical Impression Statement Continud gait training on outdoor surfaces with RW, with patient requiring intermittent CGA especially on slight incline/decline. Mild recurvatum noted on LLE with AFO donned, completed ambulation with heel wedge added but minimal improvements noted with ambulation. Will continue to progress toward goals.    Personal Factors and Comorbidities Comorbidity 2;Past/Current Experience    Comorbidities 2xCVA(left sided weakness), MI, CAD, DM    Examination-Activity Limitations Carry;Lift;Stairs;Squat;Stand;Locomotion Level    Examination-Participation Restrictions Cleaning;Community Activity;Laundry;Yard Work;Shop    Stability/Clinical Decision Making Evolving/Moderate complexity    Rehab Potential Good    Clinical Impairments Affecting Rehab Potential Hx of CVA, multiple health problems, weakness    PT Frequency 2x / week    PT Duration 12 weeks    PT Treatment/Interventions Therapeutic exercise;Therapeutic activities;Manual techniques;Aquatic Therapy;Balance training;Functional mobility training;Gait training;Neuromuscular re-education;Patient/family education;Dry needling;ADLs/Self Care Home Management;Electrical Stimulation;Moist Heat;Stair training;Orthotic Fit/Training;Passive range of motion;Energy conservation;Joint Manipulations    PT Next Visit  Plan continue gait training. nustep for endurance.  continue BLE strengthening (standing/seated). Standing balance    PT Home Exercise Plan Walking program; Access Code: [email or web address]: [email or web address]: 09/20/2021Prepared by: Theone Murdoch PatelExercisesSit to Stand with Armchair - 1 x daily - 7 x weekly - 2 sets - 10 repsStride Stance Weight Shift - 1 x daily - 7 x weekly - 2 sets - 10 repsStanding Weight Shift Side to Side - 1 x daily - 7 x weekly - 2 sets - 10 reps    Consulted and Agree with Plan of Care Patient;Family member/caregiver    Family Member Consulted wife           Patient will benefit from skilled therapeutic intervention in order to improve the following deficits and impairments:  Abnormal gait, Cardiopulmonary status limiting activity, Decreased activity tolerance, Decreased balance, Decreased endurance, Decreased mobility, Decreased strength, Difficulty walking, Impaired flexibility, Postural dysfunction  Visit Diagnosis: Muscle weakness (generalized)  Unsteadiness on feet     Problem List Patient Active Problem List   Diagnosis Date Noted  . Educated about COVID-19 virus infection 11/04/2019  . Bilateral pulmonary embolism (HCC) 10/22/2019  . History of CVA with residual deficit 10/22/2019  . Left leg DVT (HCC) 10/22/2019  . AKI (acute kidney injury) (HCC) 10/22/2019  . Fall at home, initial encounter 10/22/2019  . Cerebral thrombosis with cerebral infarction 10/14/2016  . Normocytic anemia 10/14/2016  . Facial droop 10/13/2016  . Benign essential hypertension 10/13/2016  . Controlled type 2 diabetes mellitus with  stage 3 chronic kidney disease, with long-term current use of insulin (HCC) 10/13/2016  . CVA (cerebral vascular accident) (HCC)   . CAD 03/01/2008  . CORONARY ARTERY DISEASE, S/P PTCA 03/01/2008  . Cerebral artery occlusion with cerebral infarction (HCC) 03/01/2008    Tempie Donning, PT, DPT 12/09/2019, 1:10 PM  [facility] [address] [place], [place], [address] Phone: [phone]   Fax:  [phone]  Name: JERZY ROEPKE MRN: [id number] Date of Birth: 1945/04/17

## 2019-12-11 ENCOUNTER — Ambulatory Visit (HOSPITAL_BASED_OUTPATIENT_CLINIC_OR_DEPARTMENT_OTHER): Payer: Medicare Other | Attending: Cardiology | Admitting: Cardiovascular Disease

## 2019-12-11 ENCOUNTER — Other Ambulatory Visit: Payer: Self-pay

## 2019-12-11 DIAGNOSIS — G4733 Obstructive sleep apnea (adult) (pediatric): Secondary | ICD-10-CM | POA: Diagnosis not present

## 2019-12-11 DIAGNOSIS — R5383 Other fatigue: Secondary | ICD-10-CM

## 2019-12-11 DIAGNOSIS — R0902 Hypoxemia: Secondary | ICD-10-CM | POA: Insufficient documentation

## 2019-12-11 DIAGNOSIS — R0681 Apnea, not elsewhere classified: Secondary | ICD-10-CM

## 2019-12-14 ENCOUNTER — Ambulatory Visit: Payer: Medicare Other

## 2019-12-14 ENCOUNTER — Other Ambulatory Visit: Payer: Self-pay

## 2019-12-14 DIAGNOSIS — R2681 Unsteadiness on feet: Secondary | ICD-10-CM

## 2019-12-14 DIAGNOSIS — M6281 Muscle weakness (generalized): Secondary | ICD-10-CM

## 2019-12-14 NOTE — Procedures (Signed)
Patient Name: Grant Sparks, Grant Sparks Date: 12/11/2019 Gender: Male D.O.B: October 06, 1945 Age (years): 74 Referring Provider: Minus Breeding Height (inches): 69 Interpreting Physician: Fransico Him MD, ABSM Weight (lbs): 175 RPSGT: Earney Hamburg BMI: 24 MRN: [id number] Neck Size: 19.50  CLINICAL INFORMATION Sleep Study Type: Split Night CPAP  Indication for sleep study: N/A  Epworth Sleepiness Score: 10  SLEEP STUDY TECHNIQUE As per the AASM Manual for the Scoring of Sleep and Associated Events v2.3 (April 2016) with a hypopnea requiring 4% desaturations.  The channels recorded and monitored were frontal, central and occipital EEG, electrooculogram (EOG), submentalis EMG (chin), nasal and oral airflow, thoracic and abdominal wall motion, anterior tibialis EMG, snore microphone, electrocardiogram, and pulse oximetry. Continuous positive airway pressure (CPAP) was initiated when the patient met split night criteria and was titrated according to treat sleep-disordered breathing.  MEDICATIONS Medications self-administered by patient taken the night of the study : ATORVASTATIN, ELIQUIS, insulin flex pen, METOPROLOL  RESPIRATORY PARAMETERS Diagnostic Total AHI (/hr): 53.7  RDI (/hr): 55.6  OA Index (/hr): 5.6  CA Index (/hr): 5.6 REM AHI (/hr): 34.3  NREM AHI (/hr):64.3  Supine AHI (/hr):61.5  Non-supine AHI (/hr):40.4 Min O2 Sat (%):76.0  Mean O2 (%): 92.5  Time below 88% (min):14.2   Titration Optimal Pressure (cm):  AHI at Optimal Pressure (/hr):N/A  Min O2 at Optimal Pressure (%):82.0 Supine % at Optimal (%):N/A  Sleep % at Optimal (%):N/A    The recording time for the entire night was 434.1 minutes.  During a baseline period of 160.2 minutes, the patient slept for 128.5 minutes in REM and nonREM, yielding a sleep efficiency of 80.2%. Sleep onset after lights out was 8.9 minutes with a REM latency of 81.0 minutes. The patient spent 3.5% of the night in stage N1 sleep,  61.1% in stage N2 sleep, 0.0% in stage N3 and 35.4% in REM.  During the titration period of 262.0 minutes, the patient slept for 174.5 minutes in REM and nonREM, yielding a sleep efficiency of 66.6%. Sleep onset after CPAP initiation was 26.3 minutes with a REM latency of 87.0 minutes. The patient spent 3.2% of the night in stage N1 sleep, 81.1% in stage N2 sleep, 0.0% in stage N3 and 15.8% in REM.  CARDIAC DATA The 2 lead EKG demonstrated sinus rhythm. The mean heart rate was 100.0 beats per minute. Other EKG findings include: None.  LEG MOVEMENT DATA The total Periodic Limb Movements of Sleep (PLMS) were 0. The PLMS index was 0.0 .  IMPRESSIONS - Severe obstructive sleep apnea occurred during the diagnostic portion of the study (AHI = 53.7/hour). An optimal PAP pressure could not be selected for this patient based on the available study data. - Mild central sleep apnea occurred during the diagnostic portion of the study (CAI = 5.6/hour). - Moderate oxygen desaturation was noted during the diagnostic portion of the study (Min O2 =76.0%). - The patient snored with soft snoring volume during the diagnostic portion of the study. - No cardiac abnormalities were noted during this study. - Clinically significant periodic limb movements did not occur during sleep.  DIAGNOSIS - Obstructive Sleep Apnea (G47.33) - Nocturnal Hypoxemia  RECOMMENDATIONS - Recommend BiPAP titration given sub-optimal CPAP titration. - Avoid alcohol, sedatives and other CNS depressants that may worsen sleep apnea and disrupt normal sleep architecture. - Sleep hygiene should be reviewed to assess factors that may improve sleep quality. - Weight management and regular exercise should be initiated or continued.  [Electronically signed] 12/14/2019 12:12 PM  Fransico Him MD, ABSM Diplomate, American Board of Sleep Medicine

## 2019-12-14 NOTE — Therapy (Signed)
[facility] [address] [place], [place], [address] Phone: [phone]   Fax:  [phone]  Physical Therapy Treatment  Patient Details  Name: Grant Sparks MRN: [id number] Date of Birth: 1945/10/12 Referring Provider (PT): Dr. Lacretia Nicks   Encounter Date: 12/14/2019   PT End of Session - 12/14/19 1235    Visit Number 9    Number of Visits 24    Date for PT Re-Evaluation 01/29/20    Authorization Type Eval 11/06/19    Authorization Time Period 10 visit Medicare    Progress Note Due on Visit 10    PT Start Time 1232    PT Stop Time 1314    PT Time Calculation (min) 42 min    Equipment Utilized During Treatment Gait belt    Activity Tolerance Patient tolerated treatment well;Patient limited by fatigue    Behavior During Therapy [facility] for tasks assessed/performed           Past Medical History:  Diagnosis Date  . CAD (coronary artery disease)    S/P stenting of the RCA x2 and PTCA of a PDA lesion in Jan 2004. Pt had a myoracidal infarction when he was in rehabilitation following a CVA.  . CKD (chronic kidney disease), stage II   . CVA (cerebral vascular accident) (HCC)    Right  CVA, left hemiparesis  . Diabetes mellitus without complication (HCC)   . MI (myocardial infarction) (HCC)   . Pulmonary emboli [facility])     Past Surgical History:  Procedure Laterality Date  . APPENDECTOMY    . CATARACT EXTRACTION    . CERVICAL SPINE SURGERY    . coronary artery stent placement     2004    There were no vitals filed for this visit.   Subjective Assessment - 12/14/19 1236    Subjective Patient reports no new changes since last visit. No falls to report. Patient reports had sleep study completed.    Patient is accompained by: Family member   Wife   Pertinent History : CVA(left sided weakness), MI, CAD, DM    Limitations Standing;Walking;House hold activities    How long can you sit comfortably? no issues     How long can you stand comfortably? 5 min    How long can you walk comfortably? 5 min    Diagnostic tests 01/2018: MRI head: 1. No acute intracranial process or abnormal enhancement of thebrain.2. Stable chronic microvascular ischemic changes, volume loss of thebrain, and chronic pontine/left basal ganglia infarcts.3. Chronic expansile sphenoid sinus disease with low signal contentswhich may represent inspissation or fungal ball.    Patient Stated Goals Walk better, lift/carry 20lbs, be able to climb in and out of my tractor    Currently in Pain? No/denies                             [facility] Adult PT Treatment/Exercise - 12/14/19 0001      Transfers   Transfers Sit to Stand;Stand to Sit    Sit to Stand 6: Modified independent (Device/Increase time)    Stand to Sit 6: Modified independent (Device/Increase time)    Comments completed sit <> stand from mat x 5 reps with UE support.       Ambulation/Gait   Ambulation/Gait Yes    Ambulation/Gait Assistance 5: Supervision    Ambulation/Gait Assistance Details completed gait trianing with RW x 345 ft. Patient demo improvements in knee recurvatum,  requiring minimal verbal cues from PT.     Ambulation Distance (Feet) 345 Feet    Assistive device Rolling walker    Gait Pattern Decreased dorsiflexion - left;Decreased hip/knee flexion - left;Decreased stance time - left;Decreased step length - right;Left steppage;Left foot flat;Wide base of support;Trunk flexed;Poor foot clearance - left    Ambulation Surface Level;Indoor      Exercises   Exercises Knee/Hip      Knee/Hip Exercises: Aerobic   Nustep Completed x 9 minutes with BLE on Level 5 for continued endurance and BLE strengthening. Verbal cues to keep pace above > 55 steps per minute.                Balance Exercises - 12/14/19 0001      Balance Exercises: Standing   Standing Eyes Opened Head turns;Foam/compliant surface;Limitations;Wide (BOA)    Standing Eyes Opened  Limitations standing on airex, with feet shoulder width apart completed horizontal/vertical head turns x 10 reps each direction. Completed stand with feet shoulder width apart completed bilateral shoulder flexion with ball x 15 reps to further challenge balance.     Standing Eyes Closed Wide (BOA);Foam/compliant surface;3 reps;Time;Limitations    Standing Eyes Closed Time 15-20    Standing Eyes Closed Limitations completed stance with feet shoulder width apart, completed eyes closed. increased sway noted with vision removed.     Tandem Stance Eyes open;Foam/compliant surface;Intermittent upper extremity support;3 reps;30 secs;Limitations    Tandem Stance Time completed partial tandem stance, alternating feet position, completed 3 x 30 seconds each    Stepping Strategy Anterior;Posterior;Foam/compliant surface;Limitations    Stepping Strategy Limitations standing across red balance beam, completed forward stepping strategy x 10 reps followed by posterior stepping strategy x 10 reps with single UE support. increased difficulty noted with posterior stepping strategy. intermittent verbal cues required for upright posture with completion as patient demo increased forward lean.                PT Short Term Goals - 11/16/19 1141      PT SHORT TERM GOAL #1   Title Patient will be able to ambulate 510 feet with RW in 6 minutes to improve walking endurance to improve community ambulation    Baseline 410 feet with RW in 6 MWT (eval)    Time 4    Period Weeks    Status New    Target Date 12/18/19      PT SHORT TERM GOAL #2   Title Pt will report compliance with walking program for at least 10 min with RW for 5days/week to improve walking endurance    Baseline no formal walking program    Time 4    Period Weeks    Status New    Target Date 12/18/19      PT SHORT TERM GOAL #3   Title Patient will demo 0.15 m/s improvement in walking speed using the walker to improve walking speed and improve  community ambulation    Baseline 0.6231m/s with RW (Eval)    Time 4    Period Weeks    Status New    Target Date 12/18/19      PT SHORT TERM GOAL #4   Title Pt will be able to perform 5x sit to stand in 24 seconds or better without use of UE    Baseline 29 seconds (without UE) (11/16/19)    Time 4    Period Weeks    Status New    Target Date 12/18/19  PT Long Term Goals - 11/16/19 1140      PT LONG TERM GOAL #1   Title Patient will be able to carry 20 lbs in one hand with use of cane and ambulate about 100' to be able to carry bucket of feed in barn.    Baseline unable to carry weight    Time 12    Period Weeks    Status New      PT LONG TERM GOAL #2   Title Patient will be able to ambulate 1000' with RW on level surface to improve walking endurance with prefered assistive device    Baseline 410 feet with RW (Eval)    Time 12    Period Weeks    Status New      PT LONG TERM GOAL #3   Title Patient will be able to ambulate 300 feet on grass with quad cane to improve ability to walk in his barn    Baseline unable    Time 12    Period Weeks    Status New      PT LONG TERM GOAL #4   Title Patient will be able to climb up and down from his tractor safely to be able to operate his tractor to return to PLOF    Baseline unable to climb tractor since recent hospitalization    Time 12    Period Weeks    Status New      PT LONG TERM GOAL #5   Title Patient will improve his TUG time to 12 seconds or less with walker or LRAD to promote a decrease in fall risk.    Baseline 32 seconds with RW (11/16/19)    Time 12    Target Date 01/29/20                 Plan - 12/14/19 1310    Clinical Impression Statement Continued balance exercises today working on improved stepping strategy and balance with narrow BOS and vision removed. Increased challenge noted at times with vision removed. Continue to require verbal cues for improved posture. Patient did demonstrate  improvements in knee recurvatum today as patient reports he is focusing on improving this more. Will continue to progress toward all goals.    Personal Factors and Comorbidities Comorbidity 2;Past/Current Experience    Comorbidities 2xCVA(left sided weakness), MI, CAD, DM    Examination-Activity Limitations Carry;Lift;Stairs;Squat;Stand;Locomotion Level    Examination-Participation Restrictions Cleaning;Community Activity;Laundry;Yard Work;Shop    Stability/Clinical Decision Making Evolving/Moderate complexity    Rehab Potential Good    Clinical Impairments Affecting Rehab Potential Hx of CVA, multiple health problems, weakness    PT Frequency 2x / week    PT Duration 12 weeks    PT Treatment/Interventions Therapeutic exercise;Therapeutic activities;Manual techniques;Aquatic Therapy;Balance training;Functional mobility training;Gait training;Neuromuscular re-education;Patient/family education;Dry needling;ADLs/Self Care Home Management;Electrical Stimulation;Moist Heat;Stair training;Orthotic Fit/Training;Passive range of motion;Energy conservation;Joint Manipulations    PT Next Visit Plan Check STGs. Discharge at patient's request? continue gait training. nustep for endurance.  continue BLE strengthening (standing/seated). Standing balance    PT Home Exercise Plan Walking program; Access Code: [email or web address]: [email or web address]: 09/20/2021Prepared by: Theone Murdoch PatelExercisesSit to Stand with Armchair - 1 x daily - 7 x weekly - 2 sets - 10 repsStride Stance Weight Shift - 1 x daily - 7 x weekly - 2 sets - 10 repsStanding Weight Shift Side to Side - 1 x daily - 7 x weekly - 2 sets - 10 reps    Consulted and Agree with Plan of  Care Patient;Family member/caregiver    Family Member Consulted wife           Patient will benefit from skilled therapeutic intervention in order to improve the following deficits and impairments:  Abnormal gait, Cardiopulmonary status limiting activity,  Decreased activity tolerance, Decreased balance, Decreased endurance, Decreased mobility, Decreased strength, Difficulty walking, Impaired flexibility, Postural dysfunction  Visit Diagnosis: Muscle weakness (generalized)  Unsteadiness on feet     Problem List Patient Active Problem List   Diagnosis Date Noted  . Educated about COVID-19 virus infection 11/04/2019  . Bilateral pulmonary embolism (HCC) 10/22/2019  . History of CVA with residual deficit 10/22/2019  . Left leg DVT (HCC) 10/22/2019  . AKI (acute kidney injury) (HCC) 10/22/2019  . Fall at home, initial encounter 10/22/2019  . Cerebral thrombosis with cerebral infarction 10/14/2016  . Normocytic anemia 10/14/2016  . Facial droop 10/13/2016  . Benign essential hypertension 10/13/2016  . Controlled type 2 diabetes mellitus with stage 3 chronic kidney disease, with long-term current use of insulin (HCC) 10/13/2016  . CVA (cerebral vascular accident) (HCC)   . CAD 03/01/2008  . CORONARY ARTERY DISEASE, S/P PTCA 03/01/2008  . Cerebral artery occlusion with cerebral infarction (HCC) 03/01/2008    Tempie Donning, PT, DPT 12/14/2019, 2:10 PM  [facility] [address] [place], [place], [address] Phone: [phone]   Fax:  [phone]  Name: RAEF SPRIGG MRN: [id number] Date of Birth: Sep 17, 1945

## 2019-12-16 ENCOUNTER — Ambulatory Visit: Payer: Medicare Other

## 2019-12-16 ENCOUNTER — Other Ambulatory Visit: Payer: Self-pay

## 2019-12-16 DIAGNOSIS — R2681 Unsteadiness on feet: Secondary | ICD-10-CM

## 2019-12-16 DIAGNOSIS — M6281 Muscle weakness (generalized): Secondary | ICD-10-CM | POA: Diagnosis not present

## 2019-12-16 NOTE — Therapy (Signed)
[facility] [address] [name], [place], [address] Phone: [phone]   Fax:  [phone]  Physical Therapy Treatment/Progress Note/Discharge Summary  Patient Details  Name: Grant Sparks MRN: [id number] Date of Birth: 1945/05/22 Referring Provider (PT): Dr. Fayrene Helper  PHYSICAL THERAPY DISCHARGE SUMMARY  Visits from Start of Care: 10  Current functional level related to goals / functional outcomes: See Clinical Impression Statement for Details. Patient requesting to discharge due to pleased with current functional level at this time.    Remaining deficits: Medium Fall Risk, Abnormal Gait, Decreased Endurance   Education / Equipment: Educated on [employer]   Plan: Patient agrees to discharge.  Patient goals were partially met. Patient is being discharged due to the patient's request.  ?????         Encounter Date: 12/16/2019   PT End of Session - 12/16/19 1236    Visit Number 10    Number of Visits 24    Date for PT Re-Evaluation 01/29/20    Authorization Type Eval 11/06/19    Authorization Time Period 10 visit Medicare    Progress Note Due on Visit 10    PT Start Time 1232    PT Stop Time 1305    PT Time Calculation (min) 33 min    Equipment Utilized During Treatment Gait belt    Activity Tolerance Patient tolerated treatment well;Patient limited by fatigue    Behavior During Therapy WFL for tasks assessed/performed           Past Medical History:  Diagnosis Date  . CAD (coronary artery disease)    S/P stenting of the RCA x2 and PTCA of a PDA lesion in Jan 2004. Pt had a myoracidal infarction when he was in rehabilitation following a CVA.  . CKD (chronic kidney disease), stage II   . CVA (cerebral vascular accident) ([place])    Right  CVA, left hemiparesis  . Diabetes mellitus without complication ([place])   . MI (myocardial infarction) ([place])   . Pulmonary emboli [facility])     Past  Surgical History:  Procedure Laterality Date  . APPENDECTOMY    . CATARACT EXTRACTION    . CERVICAL SPINE SURGERY    . coronary artery stent placement     2004    There were no vitals filed for this visit.   Subjective Assessment - 12/16/19 1235    Subjective Reports no new changes/complaints since last visit. No Falls. Patient reports that he got on the tractor.    Patient is accompained by: Family member   Wife   Pertinent History : CVA(left sided weakness), MI, CAD, DM    Limitations Standing;Walking;House hold activities    How long can you sit comfortably? no issues    How long can you stand comfortably? 5 min    How long can you walk comfortably? 5 min    Diagnostic tests 01/2018: MRI head: 1. No acute intracranial process or abnormal enhancement of thebrain.2. Stable chronic microvascular ischemic changes, volume loss of thebrain, and chronic pontine/left basal ganglia infarcts.3. Chronic expansile sphenoid sinus disease with low signal contentswhich may represent inspissation or fungal ball.    Patient Stated Goals Walk better, lift/carry 20lbs, be able to climb in and out of my tractor    Currently in Pain? No/denies              [facility] PT Assessment - 12/16/19 0001      6 Minute Walk- Baseline   6  Minute Walk- Baseline yes    Modified Borg Scale for Dyspnea 0- Nothing at all      6 Minute walk- Post Test   6 Minute Walk Post Test yes    Modified Borg Scale for Dyspnea 0- Nothing at all    Perceived Rate of Exertion (Borg) 13- Somewhat hard      6 minute walk test results    Aerobic Endurance Distance Walked 860    Endurance additional comments no SOB, completed with RW                 [facility] Adult PT Treatment/Exercise - 12/16/19 0001      Transfers   Transfers Sit to Stand;Stand to Sit    Sit to Stand 6: Modified independent (Device/Increase time)    Five time sit to stand comments  19.3 secs    Stand to Sit 6: Modified independent (Device/Increase  time)    Comments completed without UE support      Ambulation/Gait   Ambulation/Gait Yes    Ambulation/Gait Assistance 5: Supervision    Ambulation/Gait Assistance Details completed ambulation on indoor surfaces with RW, superivsion with completion. Able to ambulate without Rest Break, no SOB with completion and superivsion throughout. Patient demo improvements in knee recurvatum as well with attention to LLE.     Ambulation Distance (Feet) 1100 Feet    Assistive device Rolling walker    Gait Pattern Decreased dorsiflexion - left;Decreased hip/knee flexion - left;Decreased stance time - left;Decreased step length - right;Left steppage;Left foot flat;Wide base of support;Trunk flexed;Poor foot clearance - left    Ambulation Surface Level;Indoor    Gait velocity 13.25 secs = 0.75 m/s      Standardized Balance Assessment   Standardized Balance Assessment Timed Up and Go Test      Timed Up and Go Test   TUG Normal TUG    Normal TUG (seconds) 13.52   with RW           PT Education - 12/16/19 1309    Education provided Yes    Education Details Educated on progress toward LTG; walking program/HEP    Person(s) Educated Patient;Spouse    Methods Explanation    Comprehension Verbalized understanding            PT Short Term Goals - 12/16/19 1238      PT SHORT TERM GOAL #1   Title Patient will be able to ambulate 510 feet with RW in 6 minutes to improve walking endurance to improve community ambulation    Baseline 410 feet with RW in 6 MWT (eval), 860 ft with RW    Time 4    Period Weeks    Status Achieved    Target Date 12/18/19      PT SHORT TERM GOAL #2   Title Pt will report compliance with walking program for at least 10 min with RW for 5days/week to improve walking endurance    Baseline patient walking 30 minutes per day/7 days a week    Time 4    Period Weeks    Status Achieved    Target Date 12/18/19      PT SHORT TERM GOAL #3   Title Patient will demo 0.15 m/s  improvement in walking speed using the walker to improve walking speed and improve community ambulation    Baseline 0.78ms with RW, 0.75 m/s with RW    Time 4    Period Weeks    Status Achieved  Target Date 12/18/19      PT SHORT TERM GOAL #4   Title Pt will be able to perform 5x sit to stand in 24 seconds or better without use of UE    Baseline 29 seconds (without UE) (11/16/19), 19.3 seconds    Time 4    Period Weeks    Status Achieved    Target Date 12/18/19             PT Long Term Goals - 12/16/19 1239      PT LONG TERM GOAL #1   Title Patient will be able to carry 20 lbs in one hand with use of cane and ambulate about 100' to be able to carry bucket of feed in barn.    Baseline deferred due to safety concerns    Time 12    Period Weeks    Status Deferred      PT LONG TERM GOAL #2   Title Patient will be able to ambulate 1000' with RW on level surface to improve walking endurance with prefered assistive device    Baseline 410 feet with RW (Eval), 1100 ft with RW on level surfaces with supervision    Time 12    Period Weeks    Status Achieved      PT LONG TERM GOAL #3   Title Patient will be able to ambulate 300 feet on grass with quad cane to improve ability to walk in his barn    Baseline deferred due to safety concerns    Time 12    Period Weeks    Status Deferred      PT LONG TERM GOAL #4   Title Patient will be able to climb up and down from his tractor safely to be able to operate his tractor to return to PLOF    Baseline patient/wife reports able to climb/down from tractor safely    Time 12    Period Weeks    Status Achieved      PT LONG TERM GOAL #5   Title Patient will improve his TUG time to 12 seconds or less with walker or LRAD to promote a decrease in fall risk.    Baseline 32 seconds with RW (11/16/19), 13.52 seconds with RW    Time 12    Period Weeks    Status Not Met                 Plan - 12/16/19 1304    Clinical Impression  Statement Patient requesting to discharge today, therefore assessed patient's progress toward all STG/LTGs. Patient able to meet all STGs today during session. Patient able to meet some LTG's today, LTG #1 and #3 deferred at this time due to safety concerns and still currently ambulating with RW. Patient demo reduced fall risk with TUG time of 13.52 secs and 5x sit <> stand in 19.3 seconds. Patient demonstrating improved endurance with ability to ambulate x 860 ft with RW in 6MWT. PT educating on continued walking program and HEP to maintain gains upon discharge, patient/wife verbalizing understanding.    Personal Factors and Comorbidities Comorbidity 2;Past/Current Experience    Comorbidities 2xCVA(left sided weakness), MI, CAD, DM    Examination-Activity Limitations Carry;Lift;Stairs;Squat;Stand;Locomotion Level    Examination-Participation Restrictions Cleaning;Community Activity;Laundry;Yard Work;Shop    Stability/Clinical Decision Making Evolving/Moderate complexity    Rehab Potential Good    Clinical Impairments Affecting Rehab Potential Hx of CVA, multiple health problems, weakness    PT Frequency 2x / week  PT Duration 12 weeks    PT Treatment/Interventions Therapeutic exercise;Therapeutic activities;Manual techniques;Aquatic Therapy;Balance training;Functional mobility training;Gait training;Neuromuscular re-education;Patient/family education;Dry needling;ADLs/Self Care Home Management;Electrical Stimulation;Moist Heat;Stair training;Orthotic Fit/Training;Passive range of motion;Energy conservation;Joint Manipulations    PT Home Exercise Plan Walking program; Access Code: [email or web address]: [email or web address]: 09/20/2021Prepared by: Gwenyth Bouillon PatelExercisesSit to Stand with Armchair - 1 x daily - 7 x weekly - 2 sets - 10 repsStride Stance Weight Shift - 1 x daily - 7 x weekly - 2 sets - 10 repsStanding Weight Shift Side to Side - 1 x daily - 7 x weekly - 2 sets - 10 reps     Consulted and Agree with Plan of Care Patient;Family member/caregiver    Family Member Consulted wife           Patient will benefit from skilled therapeutic intervention in order to improve the following deficits and impairments:  Abnormal gait, Cardiopulmonary status limiting activity, Decreased activity tolerance, Decreased balance, Decreased endurance, Decreased mobility, Decreased strength, Difficulty walking, Impaired flexibility, Postural dysfunction  Visit Diagnosis: Muscle weakness (generalized)  Unsteadiness on feet     Problem List Patient Active Problem List   Diagnosis Date Noted  . Educated about COVID-19 virus infection 11/04/2019  . Bilateral pulmonary embolism ([place]) 10/22/2019  . History of CVA with residual deficit 10/22/2019  . Left leg DVT ([place]) 10/22/2019  . AKI (acute kidney injury) ([place]) 10/22/2019  . Fall at home, initial encounter 10/22/2019  . Cerebral thrombosis with cerebral infarction 10/14/2016  . Normocytic anemia 10/14/2016  . Facial droop 10/13/2016  . Benign essential hypertension 10/13/2016  . Controlled type 2 diabetes mellitus with stage 3 chronic kidney disease, with long-term current use of insulin ([place]) 10/13/2016  . CVA (cerebral vascular accident) ([place])   . CAD 03/01/2008  . CORONARY ARTERY DISEASE, S/P PTCA 03/01/2008  . Cerebral artery occlusion with cerebral infarction ([place]) 03/01/2008    Jones Bales, PT, DPT 12/16/2019, 1:11 PM  [place] [address] [place], [place], [address] Phone: [phone]   Fax:  [phone]  Name: LONELL STAMOS MRN: [id number] Date of Birth: 1945-10-07

## 2019-12-17 ENCOUNTER — Telehealth: Payer: Self-pay

## 2019-12-17 DIAGNOSIS — R0681 Apnea, not elsewhere classified: Secondary | ICD-10-CM

## 2019-12-17 NOTE — Telephone Encounter (Signed)
-----   Message from Quintella Reichert, MD sent at 12/14/2019 12:17 PM EDT ----- Please let patient know that they have sleep apnea  but unsuccessful CPAP titration due to severity of OSA and recommend BiPAP titration. Please set up titration in the sleep lab.

## 2019-12-17 NOTE — Telephone Encounter (Signed)
Per DPR spoke with patients wife who is aware and agreeable to sleep study results  Informed patients wife of sleep study results and patient understanding was verbalized. Patient understands her sleep study showed  - Severe obstructive sleep apnea occurred during the diagnostic portion of the study (AHI = 53.7/hour). An optimal PAP pressure could not be selected for this patient based on the available study data. - Mild central sleep apnea occurred during the diagnostic portion of the study (CAI = 5.6/hour). - Moderate oxygen desaturation was noted during the diagnostic portion of the study (Min O2 =76.0%). - The patient snored with soft snoring volume during the diagnostic portion of the study. - No cardiac abnormalities were noted during this study. - Clinically significant periodic limb movements did not occur during sleep.   Sent to precert for Bipap Titrations Orders in Patient was grateful for the call and thanked me.

## 2019-12-22 ENCOUNTER — Ambulatory Visit: Payer: Medicare Other

## 2019-12-24 ENCOUNTER — Ambulatory Visit: Payer: Medicare Other

## 2019-12-28 ENCOUNTER — Telehealth: Payer: Self-pay | Admitting: *Deleted

## 2019-12-28 NOTE — Telephone Encounter (Signed)
-----   Message from [facility] sent at 12/17/2019  1:33 PM EDT ----- Regarding: RE: Bipap Titrations No PA Required..Decision ID #:[id number] ----- Message ----- From: Rebbeca Paul, CMA Sent: 12/17/2019  10:04 AM EDT To: Reesa Chew, CMA, [facility] Sleep Studies Subject: Bipap Titrations                               Hello,  PT needs a BiPAP titration. Orders In.   Thanks

## 2019-12-28 NOTE — Telephone Encounter (Signed)
Patient is scheduled for BiPAP Titration on 02/02/20. Patient understands his titration study will be done at [facility] sleep lab. Patient understands he will receive a letter in a week or so detailing appointment, date, time, and location. Patient understands to call if he does not receive the letter  in a timely manner.  Left detailed message on voicemail and informed patient to call back with questions or to confirm appt.

## 2019-12-31 ENCOUNTER — Telehealth: Payer: Self-pay | Admitting: Cardiology

## 2019-12-31 NOTE — Telephone Encounter (Signed)
Spoke with patient's spouse. Patient will have lab work completed before his appointment on 01/11/2020.

## 2019-12-31 NOTE — Telephone Encounter (Signed)
Return call: Patient called back to confirm his bipap titration appt.

## 2019-12-31 NOTE — Telephone Encounter (Signed)
New Message:    Please call, pt have question about when should he come in for his lab work. Should he come before his appt or after appt, instructions are confusing.

## 2020-01-10 DIAGNOSIS — N1831 Chronic kidney disease, stage 3a: Secondary | ICD-10-CM | POA: Insufficient documentation

## 2020-01-10 NOTE — Progress Notes (Signed)
Cardiology Office Note   Date:  01/11/2020   ID:  Grant Sparks, DOB 1946-02-20, MRN [id number]  PCP:  Lauro Regulus, MD  Cardiologist:   [name], MD   Chief Complaint  Patient presents with  . PULMONARY EMBOLISM      History of Present Illness: Grant Sparks is a 74 y.o. 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 Since then he has done relatively well. He has been diagnosed with sleep apnea and is supposed to get a follow-up study with mask fitting. He denies any new cardiovascular symptoms. He walks with a cane. The patient denies any new symptoms such as chest discomfort, neck or arm discomfort. There has been no new shortness of breath, PND or orthopnea. There have been no reported palpitations, presyncope or syncope.   Past Medical History:  Diagnosis Date  . CAD (coronary artery disease)    S/P stenting of the RCA x2 and PTCA of a PDA lesion in Jan 2004. Pt had a myoracidal infarction when he was in rehabilitation following a CVA.  . CKD (chronic kidney disease), stage II   . CVA (cerebral vascular accident) (HCC)    Right  CVA, left hemiparesis  . Diabetes mellitus without complication (HCC)   . MI (myocardial infarction) (HCC)   . Pulmonary emboli [facility])     Past Surgical History:  Procedure Laterality Date  . APPENDECTOMY    . CATARACT EXTRACTION    . CERVICAL SPINE SURGERY    . coronary artery stent placement     2004     Current Outpatient Medications  Medication Sig Dispense Refill  . apixaban (ELIQUIS) 5 MG TABS tablet Take 1 tablet (5 mg total) by mouth 2 (two) times daily. (start this after you complete the starter pack) 60 tablet 1  . atorvastatin (LIPITOR) 80 MG tablet Take 1 tablet (80 mg total) by mouth daily. (Patient taking differently: Take 80 mg by mouth. Take 2 tablets at bedtime) 90 tablet 3  . glipiZIDE (GLUCOTROL XL) 5 MG 24 hr tablet Take 5 mg by mouth every morning.     .  hydrochlorothiazide (HYDRODIURIL) 12.5 MG tablet Take 12.5 mg by mouth in the morning.    . insulin detemir (LEVEMIR FLEXPEN) 100 UNIT/ML FlexPen Inject 30-40 Units into the skin See admin instructions. Inject 42 unit into the skin in the morning before breakfast and 30 units at bedtime    . metFORMIN (GLUCOPHAGE-XR) 500 MG 24 hr tablet Take 1,000 mg by mouth daily after supper.    . metoprolol (LOPRESSOR) 50 MG tablet Take 50 mg by mouth 2 (two) times daily.      . ramipril (ALTACE) 10 MG capsule Take 10 mg by mouth in the morning.     No current facility-administered medications for this visit.    Allergies:   Ambien [zolpidem] and Tadalafil     ROS:  Please see the history of present illness.   Otherwise, review of systems are positive for none.   All other systems are reviewed and negative.    PHYSICAL EXAM: VS:  BP (!) 148/72   Pulse 71   Ht 5\' 10"  (1.778 m)   Wt 181 lb 9.6 oz (82.4 kg)   SpO2 96%   BMI 26.06 kg/m  , BMI Body mass index is 26.06 kg/m. GENERAL:  Well appearing NECK:  No jugular venous distention, waveform within normal limits, carotid upstroke brisk  and symmetric, no bruits, no thyromegaly LUNGS:  Clear to auscultation bilaterally CHEST:  Unremarkable HEART:  PMI not displaced or sustained,S1 and S2 within normal limits, no S3, no S4, no clicks, no rubs, no murmurs ABD:  Flat, positive bowel sounds normal in frequency in pitch, no bruits, no rebound, no guarding, no midline pulsatile mass, no hepatomegaly, no splenomegaly EXT:  2 plus pulses throughout, no edema, no cyanosis no clubbing  EKG:  EKG not ordered today.    Recent Labs: 10/22/2019: B Natriuretic Peptide 155.3 10/24/2019: ALT 17; Hemoglobin 12.0; Magnesium 2.0; Platelets 159 11/09/2019: BUN 24; Creatinine, Ser 1.16; Potassium 4.7; Sodium 140; TSH 1.150    Lipid Panel    Component Value Date/Time   CHOL 131 11/09/2019 1000   TRIG 94 11/09/2019 1000   HDL 29 (L) 11/09/2019 1000   CHOLHDL 4.5  11/09/2019 1000   CHOLHDL 3.6 10/14/2016 0341   VLDL 14 10/14/2016 0341   LDLCALC 84 11/09/2019 1000      Wt Readings from Last 3 Encounters:  01/11/20 181 lb 9.6 oz (82.4 kg)  12/11/19 175 lb (79.4 kg)  11/05/19 179 lb 12.8 oz (81.6 kg)      Other studies Reviewed: Additional studies/ records that were reviewed today include: Labs Review of the above records demonstrates:  Please see elsewhere in the note.     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 I am going to continue his Eliquis at therapeutic dose for 6 months and then at maintenance dose thereafter. Of note I think this was unprovoked and he does have a brother with a history of blood clots in his 7s.  DM:  A1c was 7.9 down from 8.4.  He is now seeing an endocrinologist.  CVA HISTORY:   He has some mild residual from this but can still be active. This was thought to be related to small vessel disease from what I understand.  CKD IIIA:  His creat was last 1.15 down from 1.63. No change in therapy.  CAD:     He had his last stress test in 2017. We will continue with risk reduction.  SLEEP APNEA:   He has been diagnosed with this and is is being treated with CPAP.   He is going to consider whether he really wants to follow-up with the next step in his treatment as he does feel well.    Current medicines are reviewed at length with the patient today.  The patient does not have concerns regarding medicines.  The following changes have been made:  None  Labs/ tests ordered today include:  None  No orders of the defined types were placed in this encounter.    Disposition:   FU with in 12 months.     Signed, [name], MD  01/11/2020 9:16 AM    [facility]

## 2020-01-11 ENCOUNTER — Encounter: Payer: Self-pay | Admitting: Cardiology

## 2020-01-11 ENCOUNTER — Ambulatory Visit: Payer: Medicare Other | Admitting: Cardiology

## 2020-01-11 ENCOUNTER — Other Ambulatory Visit: Payer: Self-pay

## 2020-01-11 VITALS — BP 148/72 | HR 71 | Ht 70.0 in | Wt 181.6 lb

## 2020-01-11 DIAGNOSIS — N1831 Chronic kidney disease, stage 3a: Secondary | ICD-10-CM

## 2020-01-11 DIAGNOSIS — I259 Chronic ischemic heart disease, unspecified: Secondary | ICD-10-CM | POA: Diagnosis not present

## 2020-01-11 DIAGNOSIS — I2699 Other pulmonary embolism without acute cor pulmonale: Secondary | ICD-10-CM

## 2020-01-11 NOTE — Patient Instructions (Signed)
Medication Instructions:  No changes *If you need a refill on your cardiac medications before your next appointment, please call your pharmacy*  Lab Work: None ordered this visit.  Testing/Procedures: None ordered this visit  Follow-Up: At [facility], you and your health needs are our priority.  As part of our continuing mission to provide you with exceptional heart care, we have created designated Provider Care Teams.  These Care Teams include your primary Cardiologist (physician) and Advanced Practice Providers (APPs -  Physician Assistants and Nurse Practitioners) who all work together to provide you with the care you need, when you need it.  We recommend signing up for the patient portal called "MyChart".  Sign up information is provided on this After Visit Summary.  MyChart is used to connect with patients for Virtual Visits (Telemedicine).  Patients are able to view lab/test results, encounter notes, upcoming appointments, etc.  Non-urgent messages can be sent to your provider as well.   To learn more about what you can do with MyChart, go to [email or web address].    Your next appointment:   12 month(s)  You will receive a reminder letter in the mail two months in advance. If you don't receive a letter, please call our office to schedule the follow-up appointment.  The format for your next appointment:   In Person  Provider:   James Hochrein, MD  

## 2020-01-23 ENCOUNTER — Other Ambulatory Visit: Payer: Self-pay | Admitting: Cardiology

## 2020-02-02 ENCOUNTER — Encounter (HOSPITAL_BASED_OUTPATIENT_CLINIC_OR_DEPARTMENT_OTHER): Payer: Medicare Other | Admitting: Cardiovascular Disease

## 2020-03-20 ENCOUNTER — Other Ambulatory Visit: Payer: Self-pay | Admitting: Cardiology

## 2020-03-21 NOTE — Telephone Encounter (Signed)
Prescription refill request for Eliquis received. Indication: pe Last office visit:11/21 hochrein Scr:1.16  9/21 Age: 75 Weight:82.4 kg  Prescription refilled

## 2020-07-04 ENCOUNTER — Telehealth: Payer: Self-pay | Admitting: Cardiology

## 2020-07-04 NOTE — Telephone Encounter (Signed)
Spoke to patient's wife . Informed her will need to defer to Dr Antoine Poche what test is needed for follow up - taking Eliquis. Per wife it has been longer than 6 months since patient has seen Dr Kirtland Bouchard  (01/11/20) .   will defer to Dr Antoine Poche and contact wife and patient?   wife states the office can the information on voicemail message.

## 2020-07-04 NOTE — Telephone Encounter (Signed)
Pt wife called in and stated that they were to call back in 6 months to have blood work done to see if pt still needed to be on the ELIQUIS 5 MG TABS tablet [[id number]]  She would like to know if the order could be put in so he can come to get his blood work done.  Best number [phone]

## 2020-07-05 NOTE — Telephone Encounter (Signed)
There was no blood work or's testing done that I had planned.  Rather the plan was after 6 months of therapy to reduce him to 2.5 mg Eliquis twice daily maintenance dose since he had unprovoked pulmonary embolism.  We can make this change and schedule a follow-up with me to discuss.

## 2020-07-06 NOTE — Telephone Encounter (Signed)
Left message to call back  

## 2020-07-07 NOTE — Telephone Encounter (Signed)
Pt's wife returning call.

## 2020-07-07 NOTE — Telephone Encounter (Signed)
Spoke with wife reviewed recommendations and scheduled patient appointment next week Wife stated that patients brother and nephew have both been diagnosed with Factor V Leiden

## 2020-07-07 NOTE — Telephone Encounter (Signed)
No

## 2020-07-08 NOTE — Telephone Encounter (Signed)
No labs prior to follow up per Dr Antoine Poche

## 2020-07-14 NOTE — Progress Notes (Signed)
Cardiology Office Note   Date:  07/15/2020   ID:  Grant Sparks, DOB 05/04/1945, MRN [id number]  PCP:  Lauro Regulus, MD  Cardiologist:   [name], MD   Chief Complaint  Patient presents with  . Coronary Artery Disease      History of Present Illness: Grant Sparks is a 75 y.o. 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  He has had two family members diagnosed with Factor V Leiden deficiency since I last saw him.    He has done well.  He has a gait disturbance and balance issue from his stroke but he can still ride his lawnmower and do some mild stuff around the house. The patient denies any new symptoms such as chest discomfort, neck or arm discomfort. There has been no new shortness of breath, PND or orthopnea. There have been no reported palpitations, presyncope or syncope.    Past Medical History:  Diagnosis Date  . CAD (coronary artery disease)    S/P stenting of the RCA x2 and PTCA of a PDA lesion in Jan 2004. Pt had a myoracidal infarction when he was in rehabilitation following a CVA.  . CKD (chronic kidney disease), stage II   . CVA (cerebral vascular accident) (HCC)    Right  CVA, left hemiparesis  . Diabetes mellitus without complication (HCC)   . MI (myocardial infarction) (HCC)   . Pulmonary emboli [facility])     Past Surgical History:  Procedure Laterality Date  . APPENDECTOMY    . CATARACT EXTRACTION    . CERVICAL SPINE SURGERY    . coronary artery stent placement     2004     Current Outpatient Medications  Medication Sig Dispense Refill  . apixaban (ELIQUIS) 2.5 MG TABS tablet Take by mouth 2 (two) times daily.    [name] glipiZIDE (GLUCOTROL XL) 5 MG 24 hr tablet Take 5 mg by mouth every morning.     . hydrochlorothiazide (HYDRODIURIL) 12.5 MG tablet Take 12.5 mg by mouth in the morning.    . insulin detemir (LEVEMIR  FLEXPEN) 100 UNIT/ML FlexPen Inject 30-40 Units into the skin See admin instructions. Inject 42 unit into the skin in the morning before breakfast and 30 units at bedtime    . metFORMIN (GLUCOPHAGE-XR) 500 MG 24 hr tablet Take 1,000 mg by mouth daily after supper.    . metoprolol (LOPRESSOR) 50 MG tablet Take 50 mg by mouth 2 (two) times daily.    . ramipril (ALTACE) 10 MG capsule Take 10 mg by mouth in the morning.    . rosuvastatin (CRESTOR) 40 MG tablet Take 1 tablet (40 mg total) by mouth daily. 90 tablet 3   No current facility-administered medications for this visit.    Allergies:   Ambien [zolpidem] and Tadalafil     ROS:  Please see the history of present illness.   Otherwise, review of systems are positive for none.   All other systems are reviewed and negative.    PHYSICAL EXAM: VS:  BP 138/62   Pulse (!) 58   Ht 5\' 10"  (1.778 m)   Wt 180 lb 12.8 oz (82 kg)   SpO2 96%   BMI 25.94 kg/m  , BMI Body mass index is 25.94 kg/m. GENERAL:  Well appearing NECK:  No jugular venous distention, waveform within normal limits, carotid  upstroke brisk and symmetric, no bruits, no thyromegaly LUNGS:  Clear to auscultation bilaterally CHEST:  Unremarkable HEART:  PMI not displaced or sustained,S1 and S2 within normal limits, no S3, no S4, no clicks, no rubs, no murmurs ABD:  Flat, positive bowel sounds normal in frequency in pitch, no bruits, no rebound, no guarding, no midline pulsatile mass, no hepatomegaly, no splenomegaly EXT:  2 plus pulses throughout, no edema, no cyanosis no clubbing    EKG:  EKG ordered today. Sinus rhythm, rate 58, right bundle branch block, left anterior fascicular block, no acute ST-T wave changes.   Recent Labs: 10/22/2019: B Natriuretic Peptide 155.3 10/24/2019: ALT 17; Hemoglobin 12.0; Magnesium 2.0; Platelets 159 11/09/2019: BUN 24; Creatinine, Ser 1.16; Potassium 4.7; Sodium 140; TSH 1.150    Lipid Panel    Component Value Date/Time   CHOL 131  11/09/2019 1000   TRIG 94 11/09/2019 1000   HDL 29 (L) 11/09/2019 1000   CHOLHDL 4.5 11/09/2019 1000   CHOLHDL 3.6 10/14/2016 0341   VLDL 14 10/14/2016 0341   LDLCALC 84 11/09/2019 1000      Wt Readings from Last 3 Encounters:  07/15/20 180 lb 12.8 oz (82 kg)  01/11/20 181 lb 9.6 oz (82.4 kg)  12/11/19 175 lb (79.4 kg)      Other studies Reviewed: Additional studies/ records that were reviewed today include: Labs Review of the above records demonstrates:  Please see elsewhere in the note.     ASSESSMENT AND PLAN:  PULMONARY EMBOLISM:     He is on lifelong anticoagulation because this was an unprovoked embolism and its possible he might have factor V Leiden deficiency.  No change in therapy.   DM:  A1c was back up to 8.4 from 7.9.  He is now due to see an endocrinologist.  I will defer to their management what would suggest SGLT2 inhibitor or GLP-1 receptor antagonist.   DYSLIPIDEMIA:   His LDL is not quite at target at 84.  I am going to stop his Lipitor and start Crestor 40 mg daily.  Get labs done in a few months and I will defer to that and his primary provider with a goal LDL less than 70 ideally 55 by European guidelines.  CVA HISTORY:     He has some mild residual deficits but gets along relatively well with this.   CKD IIIA:  His creat was 1.16.  No change in therapy.  His creatinine was in the 1.6 range of one-point 9.  This might factor into the choice of antidiabetic medications.  CAD:   He has had no active symptoms since his stress test in 2017.  No change in therapy.  BIFASICULAR BLOCK:  He has had no syncope related to this.   SLEEP APNEA:   He has sleep apnea but has not wanted to use CPAP.  He wakes up rested.   Current medicines are reviewed at length with the patient today.  The patient does not have concerns regarding medicines.  The following changes have been made: As above  Labs/ tests ordered today include:  None  Orders Placed This Encounter   Procedures  . EKG 12-Lead     Disposition:   FU with in 12 months.     Signed, [name], MD  07/15/2020 8:19 AM    [facility]

## 2020-07-15 ENCOUNTER — Encounter: Payer: Self-pay | Admitting: Cardiology

## 2020-07-15 ENCOUNTER — Other Ambulatory Visit: Payer: Self-pay

## 2020-07-15 ENCOUNTER — Ambulatory Visit: Payer: Medicare Other | Admitting: Cardiology

## 2020-07-15 VITALS — BP 138/62 | HR 58 | Ht 70.0 in | Wt 180.8 lb

## 2020-07-15 DIAGNOSIS — N1831 Chronic kidney disease, stage 3a: Secondary | ICD-10-CM

## 2020-07-15 DIAGNOSIS — I259 Chronic ischemic heart disease, unspecified: Secondary | ICD-10-CM | POA: Diagnosis not present

## 2020-07-15 DIAGNOSIS — I2699 Other pulmonary embolism without acute cor pulmonale: Secondary | ICD-10-CM

## 2020-07-15 MED ORDER — ROSUVASTATIN CALCIUM 40 MG PO TABS: 40.0000 mg | ORAL_TABLET | Freq: Every day | ORAL | 3 refills | Status: DC

## 2020-07-15 NOTE — Patient Instructions (Signed)
Medication Instructions:  STOP ATORVASTATIN WHEN YOU COMPLETE CURRENT BOTTLE  THEN SWITCH TO ROSUVASTATIN 40 MG DAILY WHEN   *If you need a refill on your cardiac medications before your next appointment, please call your pharmacy*  Lab Work: IN September AT OUR PRIMARY CARE   Testing/Procedures: NONE   Follow-Up: At [facility], you and your health needs are our priority.  As part of our continuing mission to provide you with exceptional heart care, we have created designated Provider Care Teams.  These Care Teams include your primary Cardiologist (physician) and Advanced Practice Providers (APPs -  Physician Assistants and Nurse Practitioners) who all work together to provide you with the care you need, when you need it.  We recommend signing up for the patient portal called "MyChart".  Sign up information is provided on this After Visit Summary.  MyChart is used to connect with patients for Virtual Visits (Telemedicine).  Patients are able to view lab/test results, encounter notes, upcoming appointments, etc.  Non-urgent messages can be sent to your provider as well.   To learn more about what you can do with MyChart, go to [email or web address].    Your next appointment:   12 month(s)  The format for your next appointment:   In Person  Provider:   You may see [name], MD or one of the following Advanced Practice Providers on your designated Care Team:    Theodore Demark, PA-C  Joni Reining, DNP, ANP

## 2020-07-27 DEATH — deceased

## 2020-09-24 ENCOUNTER — Other Ambulatory Visit: Payer: Self-pay | Admitting: Cardiology

## 2020-09-24 DIAGNOSIS — I82432 Acute embolism and thrombosis of left popliteal vein: Secondary | ICD-10-CM

## 2020-09-24 DIAGNOSIS — I6330 Cerebral infarction due to thrombosis of unspecified cerebral artery: Secondary | ICD-10-CM

## 2020-09-26 NOTE — Telephone Encounter (Signed)
Prescription refill request for Eliquis5MG  received. PT HAS ELIQUIS 2.5MG  ON MED LIST AS WELL BUT QUALIFIES FOR THE 5MG . SENDING TO [facility] FOR CLARIFICATION  Last office visit:HOCHREIN 07/16/19 Scr: 1.2 01/16/20 Age: 6M Weight:82KG

## 2020-09-28 NOTE — Telephone Encounter (Signed)
Patient does not have a fib, is takig for DVT prophylaxis.  Dose appropriate

## 2021-02-24 IMAGING — CT CT ANGIO CHEST
2 of 6 series · 18 of 46 positions shown · IV contrast (omnipaque)
Comparison: None.

CLINICAL DATA: Shortness of breath and chest pain.

EXAM:
CT ANGIOGRAPHY CHEST WITH CONTRAST
TECHNIQUE: Multidetector CT imaging of the chest was performed using the
standard protocol during bolus administration of intravenous
contrast. Multiplanar CT image reconstructions and MIPs were
obtained to evaluate the vascular anatomy.
CONTRAST:  60mL OMNIPAQUE IOHEXOL 350 MG/ML SOLN

[Series 6: thins · axial · 0.72mm/px · z∈[+1113,+1394]mm · 15 of 309 slices shown]
[im 14/309  lung]
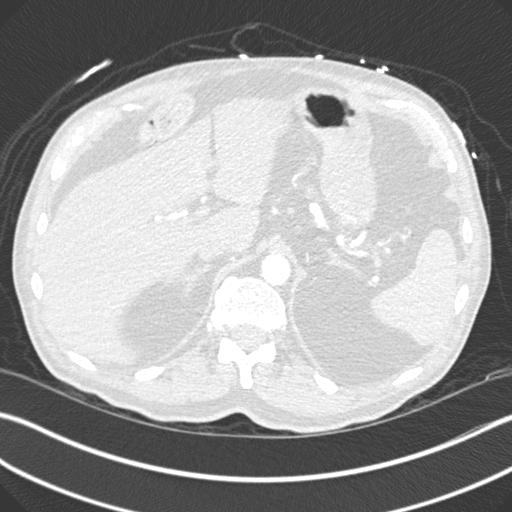
[im 41/309  soft-tissue]
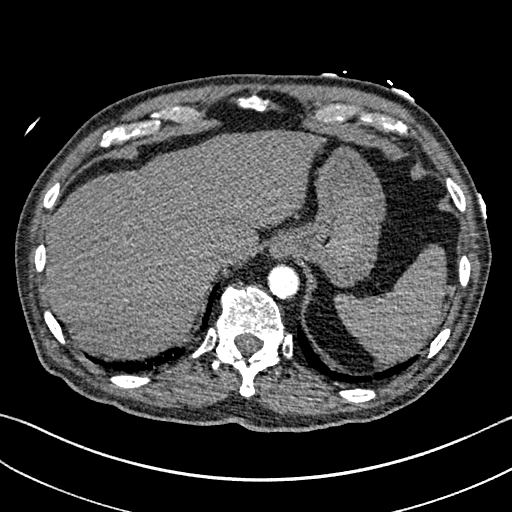
[im 54/309  lung]
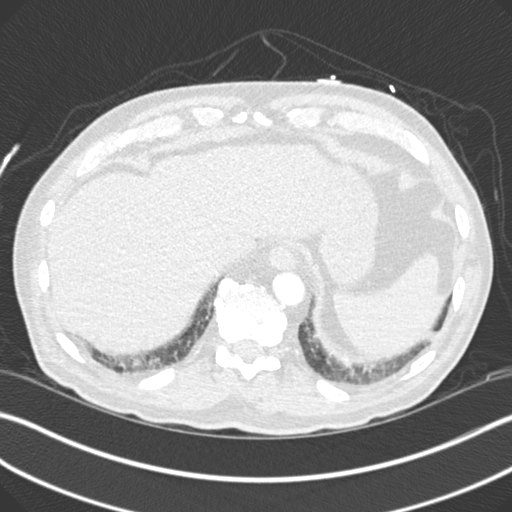
[im 81/309  soft-tissue]
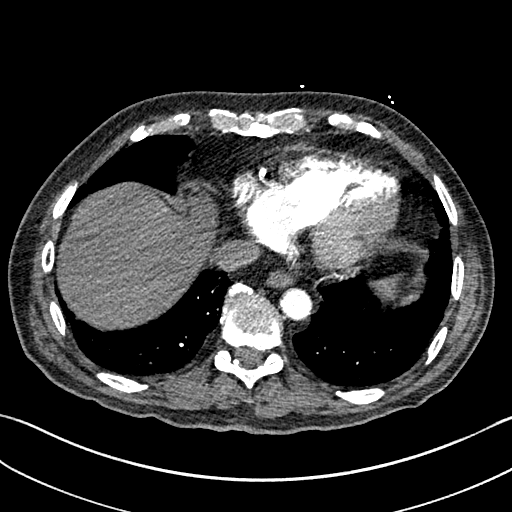
[im 94/309  lung]
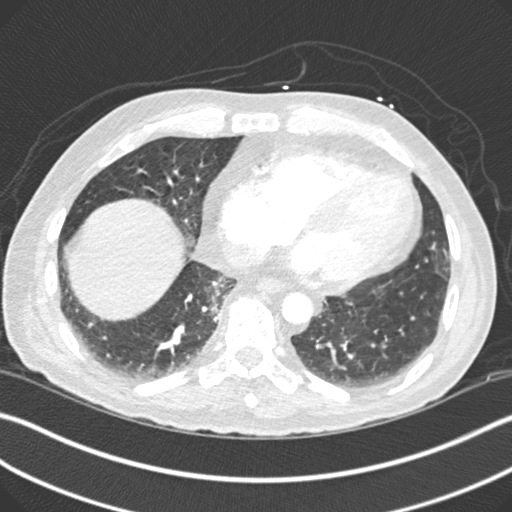
[im 121/309  soft-tissue]
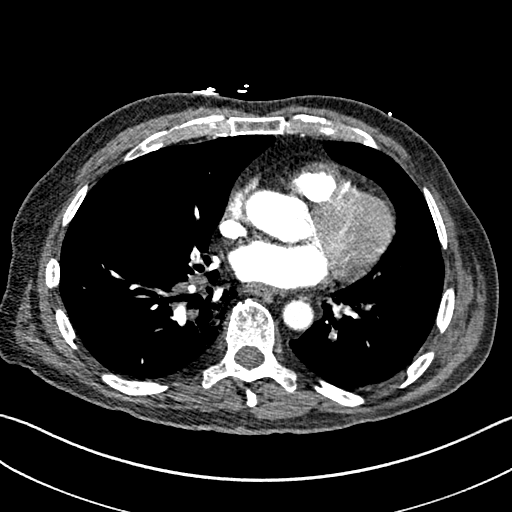
[im 134/309  lung]
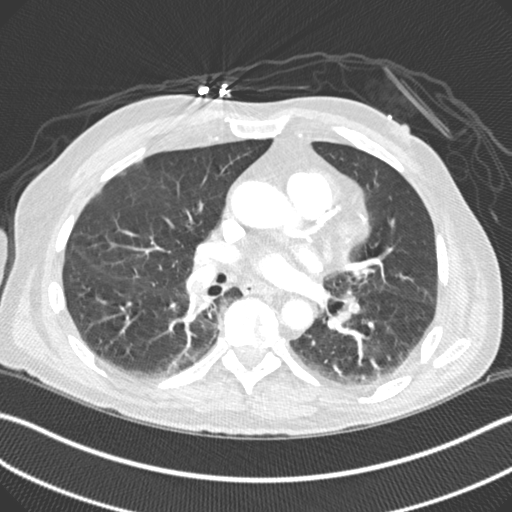
[im 161/309  soft-tissue]
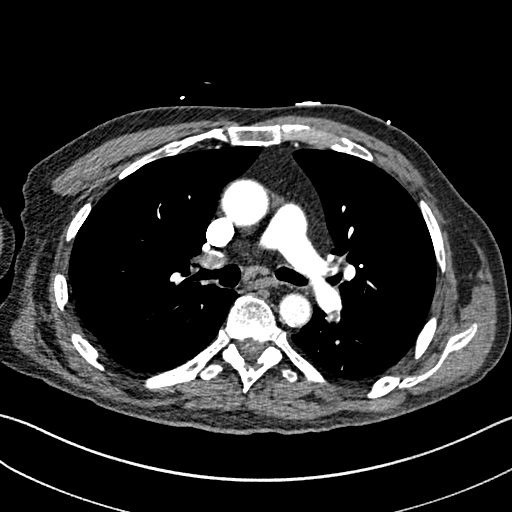
[im 175/309  lung]
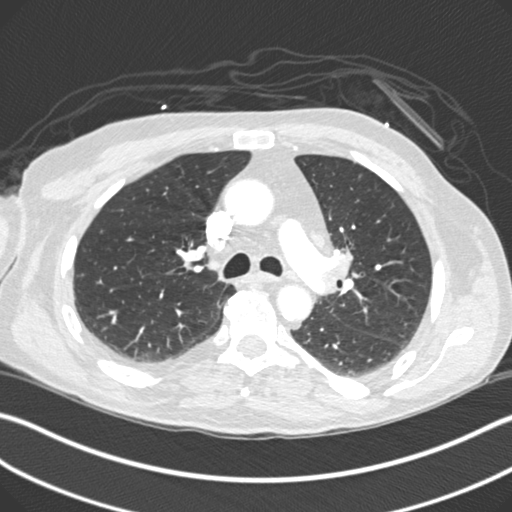
[im 188/309  soft-tissue]
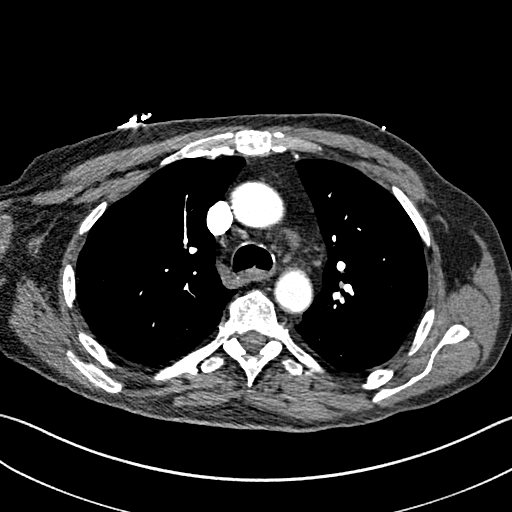
[im 215/309  lung]
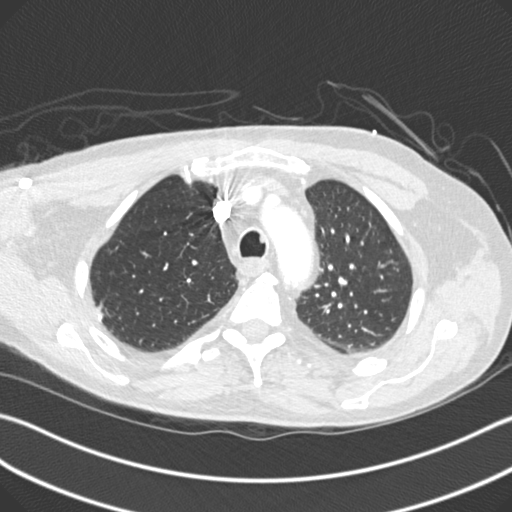
[im 228/309  soft-tissue]
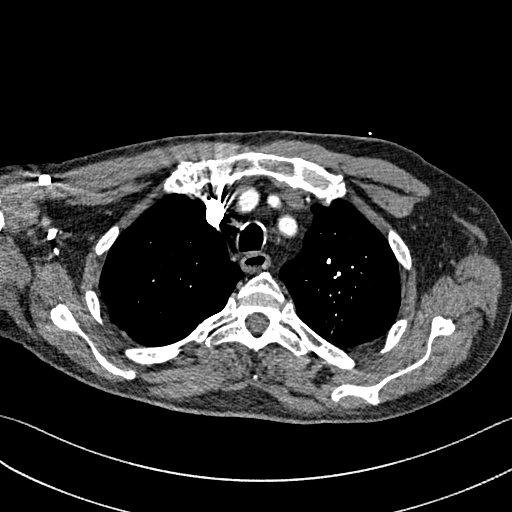
[im 255/309  lung]
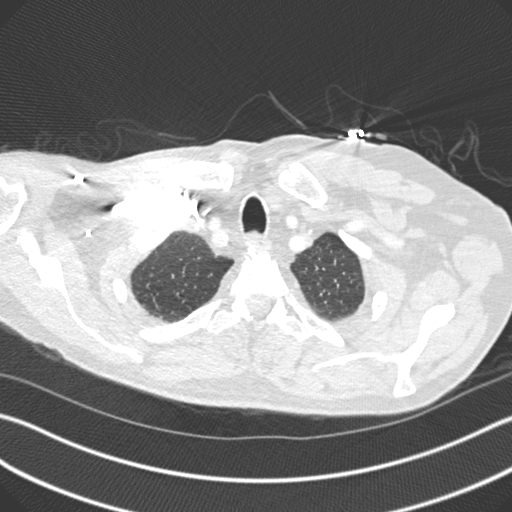
[im 268/309  soft-tissue]
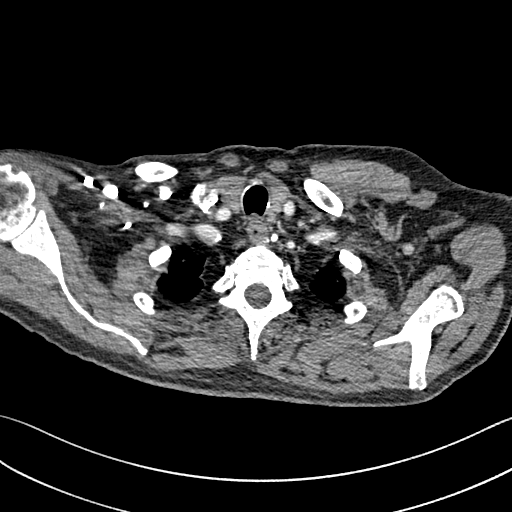
[im 295/309  lung]
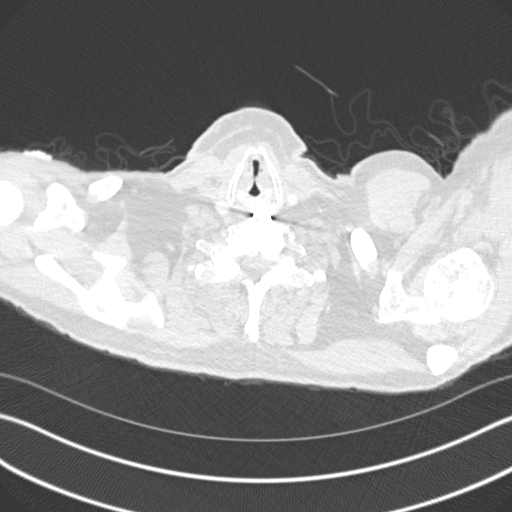

[Series 8: coronal mpr · coronal · 0.59mm/px · 3 of 151 slices shown]
[im 38/151  soft-tissue]
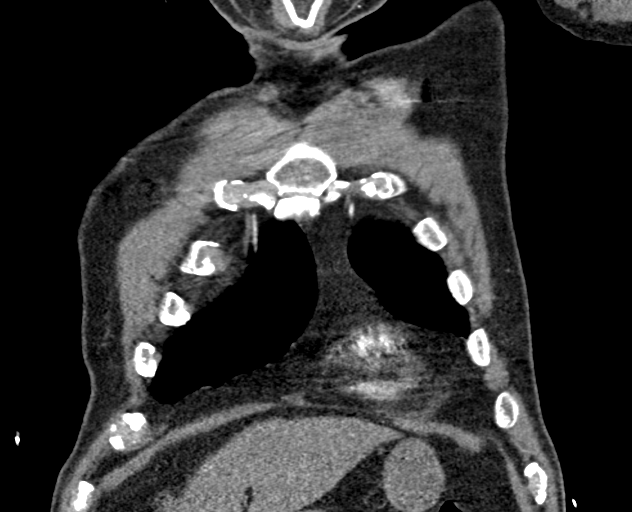
[im 76/151  soft-tissue]
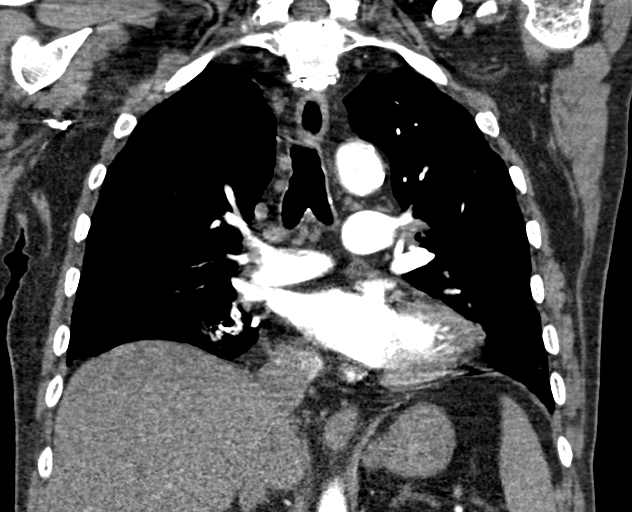
[im 113/151  soft-tissue]
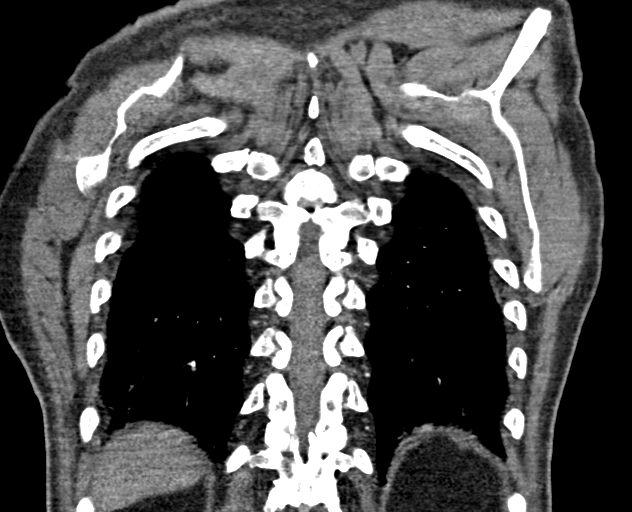

[18 of 46 positions shown; findings below may reference images not displayed]

FINDINGS: Cardiovascular: The heart size is mildly enlarged. Aortic
atherosclerosis identified. Multi vessel coronary artery
atherosclerotic calcifications.

Large bilateral central pulmonary artery filling defects are
identified. Filling defects extend into the lobar and segmental
pulmonary arteries bilaterally. The RV to LV ratio is equal to 0.94.

Mediastinum/Nodes: No enlarged mediastinal, hilar, or axillary lymph
nodes. Thyroid gland, trachea, and esophagus demonstrate no
significant findings.

Lungs/Pleura: No pleural effusion identified. No airspace
consolidation, atelectasis or pneumothorax identified. Increase
interstitial markings are identified within the lower lung zones
peripherally. No suspicious pulmonary nodule or mass identified.
Focal area of pleural calcification is identified overlying the
right upper lobe, image 32/5.

Upper Abdomen: No acute abnormality.

Musculoskeletal: Mild multilevel degenerative disc disease is noted.
Previous ACDF of the cervical spine.

Review of the MIP images confirms the above findings.
IMPRESSION: 1. Positive for acute PE with CT evidence of right heart strain
(RV/LV Ratio = 0.94) consistent with at least submassive
(intermediate risk) PE. The presence of right heart strain has been
associated with an increased risk of morbidity and mortality.
2. Coronary artery calcifications noted.
3. Aortic atherosclerosis.

Aortic Atherosclerosis ([id number]).

Critical Value/emergent results were called by telephone at the time
of interpretation on 10/22/2019 at [DATE] to provider CHIDIMMA LINUS ,
who verbally acknowledged these results.

## 2021-02-28 ENCOUNTER — Other Ambulatory Visit: Payer: Self-pay

## 2021-02-28 ENCOUNTER — Encounter: Payer: Medicare Other | Attending: Internal Medicine | Admitting: *Deleted

## 2021-02-28 ENCOUNTER — Encounter: Payer: Self-pay | Admitting: *Deleted

## 2021-02-28 VITALS — BP 110/60 | Ht 72.0 in | Wt 170.4 lb

## 2021-02-28 DIAGNOSIS — Z794 Long term (current) use of insulin: Secondary | ICD-10-CM | POA: Insufficient documentation

## 2021-02-28 DIAGNOSIS — E1165 Type 2 diabetes mellitus with hyperglycemia: Secondary | ICD-10-CM | POA: Insufficient documentation

## 2021-02-28 NOTE — Progress Notes (Signed)
Diabetes Self-Management Education  Visit Type: First/Initial  Appt. Start Time: 1040 Appt. End Time: 1205  02/28/2021  [name], identified by name and date of birth, is a 76 y.o. male with a diagnosis of Diabetes: Type 2.   ASSESSMENT  Blood pressure 110/60, height 6' (1.829 m), weight 170 lb 6.4 oz (77.3 kg). Body mass index is 23.11 kg/m.   Diabetes Self-Management Education - 02/28/21 1544       Visit Information   Visit Type First/Initial      Initial Visit   Diabetes Type Type 2    Are you currently following a meal plan? No    Are you taking your medications as prescribed? No   Pt is splitting Levemir and reports BG were worse when taking all at once.   Date Diagnosed 20 years ago      Health Coping   How would you rate your overall health? Good      Psychosocial Assessment   Patient Belief/Attitude about Diabetes Other (comment)   "It don't really bother me"   Self-care barriers None    Self-management support Doctor's office;Family    Other persons present Spouse/SO    Patient Concerns Nutrition/Meal planning;Glycemic Control    Special Needs None    Preferred Learning Style Visual    Learning Readiness Ready    How often do you need to have someone help you when you read instructions, pamphlets, or other written materials from your doctor or pharmacy? 1 - Never      Pre-Education Assessment   Patient understands the diabetes disease and treatment process. Needs Review    Patient understands incorporating nutritional management into lifestyle. Needs Review    Patient undertands incorporating physical activity into lifestyle. Needs Instruction    Patient understands using medications safely. Needs Review    Patient understands monitoring blood glucose, interpreting and using results Needs Review    Patient understands prevention, detection, and treatment of acute complications. Needs Review    Patient understands prevention, detection, and treatment of  chronic complications. Needs Review    Patient understands how to develop strategies to address psychosocial issues. Needs Review    Patient understands how to develop strategies to promote health/change behavior. Needs Review      Complications   Last HgB A1C per patient/outside source 8.6 %   12/30/2020   How often do you check your blood sugar? 1-2 times/day    Fasting Blood glucose range (mg/dL) 937-169   Pt reports average for Dec was fasting - 163 mg/dL and before supper - 678 mg/dL. Some of the evening readings were after 1 hour eating.   Number of hypoglycemic episodes per month 1   He reports BG symptoms last week with reading of 74 mg/dL.   Can you tell when your blood sugar is low? Yes    What do you do if your blood sugar is low? orange juice; has peppermints in his car   Have you had a dilated eye exam in the past 12 months? Yes    Have you had a dental exam in the past 12 months? No   dentures   Are you checking your feet? Yes    How many days per week are you checking your feet? 7      Dietary Intake   Breakfast sausage gravy from fast food - somtimes with a biscuit or hashbrown    Snack (morning) 1-2 snacks/day - peanut butter, nuts, oatmeal cookie (little Debbie)  Lunch cheese sandwich, chips    Dinner chicken, tenderloin, occasional beef; potatoes, peas, beans, corn. occasional salads, hot dogs - doesn't like many non-starchy vegetables    Beverage(s) water, unsweetened tea, diet soda      Exercise   Exercise Type ADL's      Patient Education   Previous Diabetes Education Yes (please comment)   18 years ago   Disease state  Explored patient's options for treatment of their diabetes    Nutrition management  Role of diet in the treatment of diabetes and the relationship between the three main macronutrients and blood glucose level;Food label reading, portion sizes and measuring food.;Reviewed blood glucose goals for pre and post meals and how to evaluate the patients'  food intake on their blood glucose level.;Meal timing in regards to the patients' current diabetes medication.;Information on hints to eating out and maintain blood glucose control.    Physical activity and exercise  Role of exercise on diabetes management, blood pressure control and cardiac health.;Other (comment)   chair exercises   Medications Taught/reviewed insulin injection, site rotation, insulin storage and needle disposal.;Reviewed patients medication for diabetes, action, purpose, timing of dose and side effects.;Other (comment)   discussed use of SGLT2 inhibitors and possibly taking insulin at meals; he reports he was not able to tolerate Ozempic - feeling week and big weight loss in few months   Monitoring Purpose and frequency of SMBG.;Taught/discussed recording of test results and interpretation of SMBG.;Identified appropriate SMBG and/or A1C goals.    Acute complications Taught treatment of hypoglycemia - the 15 rule.    Chronic complications Relationship between chronic complications and blood glucose control    Psychosocial adjustment Role of stress on diabetes;Identified and addressed patients feelings and concerns about diabetes      Individualized Goals (developed by patient)   Reducing Risk Other (comment)   improve blood sugars     Outcomes   Expected Outcomes Demonstrated interest in learning. Expect positive outcomes    Future DMSE 4-6 wks        Individualized Plan for Diabetes Self-Management Training:   Learning Objective:  Patient will have a greater understanding of diabetes self-management. Patient education plan is to attend individual and/or group sessions per assessed needs and concerns.   Plan:   Patient Instructions  Check blood sugars 2 x day before breakfast and before supper every day Bring blood sugar records to the next appointment  Walk as tolerated or do chair exercises 10-15 minutes daily  Eat 3 meals day,   1-2  snacks a day Space meals 4-6  hours apart Limit foods high in fat  Complete 3 Day Food Record and bring to next appt  Carry fast acting glucose and a snack at all times Rotate injection sites Hold insulin pen in place for 5-10 seconds after injection  Return for appointment on:   Tues March 28, 2021 at 10:00 am with Velna Hatchet (nurse)  Expected Outcomes:  Demonstrated interest in learning. Expect positive outcomes  Education material provided:  General Meal Planning Guidelines Simple Meal Plan 3 Day Food Record Symptoms, causes and treatments of Hypoglycemia Exercise handout (ADA)   If problems or questions, patient to contact team via:   Sharion Settler, RN, CCM, CDCES [phone]  Future DSME appointment: 4-6 wks March 28, 2021 with this nurse

## 2021-02-28 NOTE — Patient Instructions (Addendum)
Check blood sugars 2 x day before breakfast and before supper every day Bring blood sugar records to the next appointment  Walk as tolerated or do chair exercises 10-15 minutes daily  Eat 3 meals day,   1-2  snacks a day Space meals 4-6 hours apart Limit foods high in fat  Complete 3 Day Food Record and bring to next appt  Carry fast acting glucose and a snack at all times Rotate injection sites Hold insulin pen in place for 5-10 seconds after injection  Return for appointment on:   Tues March 28, 2021 at 10:00 am with Velna Hatchet (nurse)

## 2021-03-14 NOTE — Progress Notes (Signed)
Cardiology Office Note   Date:  03/16/2021   ID:  Grant Sparks, DOB [birth year], MRN [id number]  PCP:  Lauro Regulus, MD  Cardiologist:   [name], MD   Chief Complaint  Patient presents with   Coronary Artery Disease      History of Present Illness: Grant Sparks is a 76 y.o. 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  He has had two family members diagnosed with Factor V Leiden deficiency since I last saw him.    Since I last saw him he has done well from a cardiovascular standpoint.  He walks up a slight incline from his work shed.  He might get fatigued.  He does not have chest discomfort, neck or arm discomfort.  He does not have excessive shortness of breath, PND or orthopnea.  He has no palpitations, presyncope or syncope.   Past Medical History:  Diagnosis Date   CAD (coronary artery disease)    S/P stenting of the RCA x2 and PTCA of a PDA lesion in Jan 2004. Pt had a myoracidal infarction when he was in rehabilitation following a CVA.   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 180 tablet 1   hydrochlorothiazide (HYDRODIURIL) 12.5 MG tablet Take 12.5 mg by mouth in the morning.     insulin detemir (LEVEMIR FLEXPEN) 100 UNIT/ML FlexPen Inject 35-40 Units into the skin See admin instructions. Inject 35 unit into the skin in  the morning before breakfast and 40 units at bedtime     metFORMIN (GLUCOPHAGE-XR) 500 MG 24 hr tablet Take 1,000 mg by mouth daily after supper.     metoprolol (LOPRESSOR) 50 MG tablet Take 50 mg by mouth 2 (two) times daily.     ramipril (ALTACE) 10 MG capsule Take 10 mg by mouth in the morning.     rosuvastatin (CRESTOR) 40 MG tablet Take 40 mg by mouth daily.     No current facility-administered medications for this visit.    Allergies:   Ambien [zolpidem] and Tadalafil     ROS:  Please see the history of present illness.   Otherwise, review of systems are positive for none.   All other systems are reviewed and negative.    PHYSICAL EXAM: VS:  BP 124/64    Pulse 67    Ht 5\' 10"  (1.778 m)    Wt 170 lb (77.1 kg)    BMI 24.39 kg/m  , BMI Body mass index is 24.39 kg/m. GENERAL:  Well appearing NECK:  No jugular venous distention, waveform within normal limits, carotid upstroke brisk and symmetric, no bruits, no thyromegaly LUNGS:  Clear to auscultation bilaterally CHEST:  Unremarkable HEART:  PMI not displaced or sustained,S1 and S2 within  normal limits, no S3, no S4, no clicks, no rubs, no murmurs ABD:  Flat, positive bowel sounds normal in frequency in pitch, no bruits, no rebound, no guarding, no midline pulsatile mass, no hepatomegaly, no splenomegaly EXT:  2 plus pulses throughout, no edema, no cyanosis no clubbing  EKG:  EKG is  ordered today. Sinus rhythm, rate 67, right bundle branch block, left anterior fascicular block, no acute ST-T wave changes.   Recent Labs: No results found for requested labs within last 8760 hours.    Lipid Panel    Component Value Date/Time   CHOL 131 11/09/2019 1000   TRIG 94 11/09/2019 1000   HDL 29 (L) 11/09/2019 1000   CHOLHDL 4.5 11/09/2019 1000   CHOLHDL 3.6 10/14/2016 0341   VLDL 14 10/14/2016 0341   LDLCALC 84 11/09/2019 1000      Wt Readings from Last 3 Encounters:  03/16/21 170 lb (77.1 kg)  02/28/21 170 lb 6.4 oz (77.3  kg)  07/15/20 180 lb 12.8 oz (82 kg)      Other studies Reviewed: Additional studies/ records that were reviewed today include: Labs Review of the above records demonstrates:  Please see elsewhere in the note.     ASSESSMENT AND PLAN:  PULMONARY EMBOLISM:   He is on lifelong anticoagulation with a factor V Leiden deficiency.  No change in therapy.  DM:  A1c was up to 8.6 and he is now followed by [facility] endocrinology.  He was not able to tolerate Ozempic recently.   DYSLIPIDEMIA:   His LDL is 84 with an HDL of 29.  I did switch him to Crestor most recently and then his LDL went down to 60 with an HDL of 35.  He will continue with meds as listed.   CVA HISTORY:     He has some mild residual deficits but gets along relatively well with this.   CKD IIIA:  His creat was 1.1.   CAD:   The patient has no new sypmtoms.  No further cardiovascular testing is indicated.  We will continue with aggressive risk reduction and meds as listed.  BIFASICULAR BLOCK: He has had no syncope or symptoms related to this and knows that he should let me know.   SLEEP APNEA:   He has sleep apnea but has not wanted to use CPAP.    Current medicines are reviewed at length with the patient today.  The patient does not have concerns regarding medicines.  The following changes have been made: None  Labs/ tests ordered today include:  None  Orders Placed This Encounter  Procedures   EKG 12-Lead     Disposition:   FU with in 12 months.     Signed, [name], MD  03/16/2021 10:11 AM    [facility]

## 2021-03-16 ENCOUNTER — Encounter: Payer: Self-pay | Admitting: Cardiology

## 2021-03-16 ENCOUNTER — Ambulatory Visit: Payer: Medicare Other | Admitting: Cardiology

## 2021-03-16 ENCOUNTER — Other Ambulatory Visit: Payer: Self-pay

## 2021-03-16 VITALS — BP 124/64 | HR 67 | Ht 70.0 in | Wt 170.0 lb

## 2021-03-16 DIAGNOSIS — E118 Type 2 diabetes mellitus with unspecified complications: Secondary | ICD-10-CM | POA: Diagnosis not present

## 2021-03-16 DIAGNOSIS — E785 Hyperlipidemia, unspecified: Secondary | ICD-10-CM

## 2021-03-16 DIAGNOSIS — I639 Cerebral infarction, unspecified: Secondary | ICD-10-CM | POA: Diagnosis not present

## 2021-03-16 DIAGNOSIS — I2510 Atherosclerotic heart disease of native coronary artery without angina pectoris: Secondary | ICD-10-CM | POA: Diagnosis not present

## 2021-03-16 NOTE — Patient Instructions (Signed)
Medication Instructions:  °Your Physician recommend you continue on your current medication as directed.   ° °*If you need a refill on your cardiac medications before your next appointment, please call your pharmacy* ° ° °Follow-Up: °At [facility], you and your health needs are our priority.  As part of our continuing mission to provide you with exceptional heart care, we have created designated Provider Care Teams.  These Care Teams include your primary Cardiologist (physician) and Advanced Practice Providers (APPs -  Physician Assistants and Nurse Practitioners) who all work together to provide you with the care you need, when you need it. ° °We recommend signing up for the patient portal called "MyChart".  Sign up information is provided on this After Visit Summary.  MyChart is used to connect with patients for Virtual Visits (Telemedicine).  Patients are able to view lab/test results, encounter notes, upcoming appointments, etc.  Non-urgent messages can be sent to your provider as well.   °To learn more about what you can do with MyChart, go to [email or web address].   ° °Your next appointment:   °1 year(s) ° °The format for your next appointment:   °In Person ° °Provider:   °James Hochrein, MD  ° ° ° °

## 2021-03-23 ENCOUNTER — Other Ambulatory Visit: Payer: Self-pay | Admitting: Cardiology

## 2021-03-23 DIAGNOSIS — I82432 Acute embolism and thrombosis of left popliteal vein: Secondary | ICD-10-CM

## 2021-03-23 DIAGNOSIS — I6330 Cerebral infarction due to thrombosis of unspecified cerebral artery: Secondary | ICD-10-CM

## 2021-03-23 NOTE — Telephone Encounter (Signed)
Prescription refill request for Eliquis received. Indication:PE Last office visit:1/23 Scr:1.1 Age: 76 Weight:77.1 kg  Prescription refilled

## 2021-03-28 ENCOUNTER — Encounter: Payer: Self-pay | Admitting: *Deleted

## 2021-03-28 ENCOUNTER — Encounter: Payer: Medicare Other | Admitting: *Deleted

## 2021-03-28 ENCOUNTER — Other Ambulatory Visit: Payer: Self-pay

## 2021-03-28 VITALS — BP 110/60 | Wt 172.4 lb

## 2021-03-28 DIAGNOSIS — E1165 Type 2 diabetes mellitus with hyperglycemia: Secondary | ICD-10-CM | POA: Diagnosis not present

## 2021-03-28 DIAGNOSIS — Z794 Long term (current) use of insulin: Secondary | ICD-10-CM

## 2021-03-28 NOTE — Progress Notes (Signed)
Diabetes Self-Management Education  Visit Type: Follow-up  Appt. Start Time: 0955 Appt. End Time: 1050  03/28/2021  [name], identified by name and date of birth, is a 76 y.o. male with a diagnosis of Diabetes: Type 2 .   ASSESSMENT  Blood pressure 110/60, weight 172 lb 6.4 oz (78.2 kg). Body mass index is 24.74 kg/m.   Diabetes Self-Management Education - 03/28/21 1129       Visit Information   Visit Type Follow-up      Complications   How often do you check your blood sugar? 1-2 times/day    Fasting Blood glucose range (mg/dL) 81-829;937-169;678-938;>101   FBG's 105-412 mg/dL   Postprandial Blood glucose range (mg/dL) >751   pp's 025-852 mg/dL; pre-supper 778-242 mg/dL   Number of hypoglycemic episodes per month 0    Have you had a dilated eye exam in the past 12 months? Yes    Have you had a dental exam in the past 12 months? No   dentures   Are you checking your feet? Yes    How many days per week are you checking your feet? 7      Dietary Intake   Breakfast 3 meals and 1 snack/day - eats out for most breakfast and supper measl      Exercise   Exercise Type ADL's      Patient Education   Disease state  Explored patient's options for treatment of their diabetes    Nutrition management  Food label reading, portion sizes and measuring food.;Reviewed blood glucose goals for pre and post meals and how to evaluate the patients' food intake on their blood glucose level.;Information on hints to eating out and maintain blood glucose control.;Meal options for control of blood glucose level and chronic complications.    Physical activity and exercise  Helped patient identify appropriate exercises in relation to his/her diabetes, diabetes complications and other health issue.    Medications Taught/reviewed insulin injection, site rotation, insulin storage and needle disposal.;Reviewed patients medication for diabetes, action, purpose, timing of dose and side effects.     Monitoring Taught/discussed recording of test results and interpretation of SMBG.;Identified appropriate SMBG and/or A1C goals.    Acute complications Taught treatment of hypoglycemia - the 15 rule.    Chronic complications Relationship between chronic complications and blood glucose control      Individualized Goals (developed by patient)   Nutrition Follow meal plan discussed    Medications take my medication as prescribed    Monitoring  test my blood glucose as discussed    Reducing Risk treat hypoglycemia with 15 grams of carbs if blood glucose less than 70mg /dL;examine blood glucose patterns      Post-Education Assessment   Patient understands the diabetes disease and treatment process. Demonstrates understanding / competency    Patient understands incorporating nutritional management into lifestyle. Needs Review    Patient undertands incorporating physical activity into lifestyle. Needs Review    Patient understands using medications safely. Demonstrates understanding / competency    Patient understands monitoring blood glucose, interpreting and using results Demonstrates understanding / competency    Patient understands prevention, detection, and treatment of acute complications. Demonstrates understanding / competency    Patient understands prevention, detection, and treatment of chronic complications. Demonstrates understanding / competency    Patient understands how to develop strategies to address psychosocial issues. Demonstrates understanding / competency    Patient understands how to develop strategies to promote health/change behavior. Demonstrates understanding / competency  Outcomes   Expected Outcomes Demonstrated interest in learning. Expect positive outcomes    Program Status Completed      Subsequent Visit   Since your last visit have you continued or begun to take your medications as prescribed? Yes    Since your last visit have you had your blood pressure  checked? No    Since your last visit have you experienced any weight changes? Gain    Weight Gain (lbs) 2    Since your last visit, are you checking your blood glucose at least once a day? Yes        Individualized Plan for Diabetes Self-Management Training:   Learning Objective:  Patient will have a greater understanding of diabetes self-management. Patient education plan is to attend individual and/or group sessions per assessed needs and concerns.   Plan:   Patient Instructions  Check blood sugars 2 x day before breakfast and before supper every day Take your blood sugar records to MD appointments  Walk as tolerated  Eat 3 meals day,   1-2  snacks a day Space meals 4-6 hours apart Limit foods high in fat  Carry fast acting glucose and a snack at all times Rotate injection sites  Hold insulin pen in place for 5-10 seconds after injection  Expected Outcomes:  Demonstrated interest in learning. Expect positive outcomes  Education material provided:  Planning a Balanced Meal Quick and Balanced Meals Diabetes Plate Method (ADA)  If problems or questions, patient to contact team via:   Sharion Settler, RN, CCM, CDCES [phone]  Future DSME appointment:PRN

## 2021-03-28 NOTE — Patient Instructions (Signed)
Check blood sugars 2 x day before breakfast and before supper every day Take your blood sugar records to MD appointments  Walk as tolerated  Eat 3 meals day,   1-2  snacks a day Space meals 4-6 hours apart Limit foods high in fat  Carry fast acting glucose and a snack at all times Rotate injection sites  Hold insulin pen in place for 5-10 seconds after injection

## 2021-04-26 ENCOUNTER — Other Ambulatory Visit: Payer: Self-pay | Admitting: Cardiology

## 2021-09-26 ENCOUNTER — Other Ambulatory Visit: Payer: Self-pay | Admitting: Cardiology

## 2021-09-26 DIAGNOSIS — I82432 Acute embolism and thrombosis of left popliteal vein: Secondary | ICD-10-CM

## 2021-09-26 DIAGNOSIS — I6330 Cerebral infarction due to thrombosis of unspecified cerebral artery: Secondary | ICD-10-CM

## 2021-09-27 NOTE — Telephone Encounter (Signed)
Prescription refill request for Eliquis received. Indication: PE/DVT Last office visit: 03/16/21 Scr: 1.4 on 07/20/21 Age: 76 Weight: 77.1kg  Based on above findings Eliquis 2.5mg  twice daily is the appropriate dose for Dx.  Refill approved.

## 2022-03-21 DIAGNOSIS — E785 Hyperlipidemia, unspecified: Secondary | ICD-10-CM | POA: Insufficient documentation

## 2022-03-21 DIAGNOSIS — G4730 Sleep apnea, unspecified: Secondary | ICD-10-CM | POA: Insufficient documentation

## 2022-03-21 NOTE — Progress Notes (Signed)
Cardiology Office Note   Date:  03/23/2022   ID:  Grant Sparks, DOB 1945/10/19, MRN [id number]  PCP:  Kirk Ruths, MD  Cardiologist:   Minus Breeding, MD   Chief Complaint  Patient presents with   Coronary Artery Disease      History of Present Illness: Grant Sparks is a 77 y.o. 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    I last saw him he has done well. The patient denies any new symptoms such as chest discomfort, neck or arm discomfort. There has been no new shortness of breath, PND or orthopnea. There have been no reported palpitations, presyncope or syncope.  He actually uses his rolling walker to walk track and is okay with this.  He has residual issues related to his previous CVA.   Past Medical History:  Diagnosis Date   CAD (coronary artery disease)    S/P stenting of the RCA x2 and PTCA of a PDA lesion in Jan 2004. Pt had a myoracidal infarction when he was in rehabilitation following a CVA.   CKD (chronic kidney disease), stage II    CVA (cerebral vascular accident) ([name])    Right  CVA, left hemiparesis   Diabetes mellitus without complication (HCC)    MI (myocardial infarction) ([place])    Pulmonary emboli ([place])     Past Surgical History:  Procedure Laterality Date   APPENDECTOMY     CATARACT EXTRACTION     CERVICAL SPINE SURGERY     coronary artery stent placement     2004     Current Outpatient Medications  Medication Sig Dispense Refill   apixaban (ELIQUIS) 2.5 MG TABS tablet Take 1 tablet (2.5 mg total) by mouth 2 (two) times daily. 180 tablet 3   FARXIGA 10 MG TABS tablet Take 10 mg by mouth daily.     hydrochlorothiazide (HYDRODIURIL) 12.5 MG tablet Take 12.5 mg by mouth in the morning.     insulin detemir (LEVEMIR FLEXPEN) 100 UNIT/ML FlexPen Inject 35-40 Units into the skin See admin instructions. Inject 40 unit  into the skin in the morning before breakfast and 40 units at bedtime     metFORMIN (GLUCOPHAGE-XR) 500 MG 24 hr tablet Take 1,000 mg by mouth daily after supper.     metoprolol (LOPRESSOR) 50 MG tablet Take 50 mg by mouth 2 (two) times daily.     ramipril (ALTACE) 10 MG capsule Take 10 mg by mouth in the morning.     rosuvastatin (CRESTOR) 40 MG tablet TAKE 1 TABLET BY MOUTH  DAILY 90 tablet 3   No current facility-administered medications for this visit.    Allergies:   Ambien [zolpidem] and Tadalafil     ROS:  Please see the history of present illness.   Otherwise, review of systems are positive for none.   All other systems are reviewed and negative.    PHYSICAL EXAM: VS:  BP 124/60 (BP Location: Right Arm, Patient Position: Sitting, Cuff Size: Normal)   Pulse 72   Ht 6' (1.829 m)   Wt 166 lb (75.3 kg)   BMI 22.51 kg/m  , BMI Body mass index is 22.51 kg/m. GENERAL:  Well appearing NECK:  No jugular venous distention, waveform within normal limits, carotid upstroke brisk and symmetric, no bruits, no thyromegaly LUNGS:  Clear to auscultation bilaterally  CHEST:  Unremarkable HEART:  PMI not displaced or sustained,S1 and S2 within normal limits, no S3, no S4, no clicks, no rubs, no murmurs ABD:  Flat, positive bowel sounds normal in frequency in pitch, no bruits, no rebound, no guarding, no midline pulsatile mass, no hepatomegaly, no splenomegaly EXT:  2 plus pulses throughout, no edema, no cyanosis no clubbing   EKG:  EKG is  ordered today. Sinus rhythm, rate 72, right bundle branch block, left anterior fascicular block, no acute ST-T wave changes.   Recent Labs: No results found for requested labs within last 365 days.    Lipid Panel    Component Value Date/Time   CHOL 131 11/09/2019 1000   TRIG 94 11/09/2019 1000   HDL 29 (L) 11/09/2019 1000   CHOLHDL 4.5 11/09/2019 1000   CHOLHDL 3.6 10/14/2016 0341   VLDL 14 10/14/2016 0341   LDLCALC 84 11/09/2019 1000       Wt Readings from Last 3 Encounters:  03/23/22 166 lb (75.3 kg)  03/28/21 172 lb 6.4 oz (78.2 kg)  03/16/21 170 lb (77.1 kg)      Other studies Reviewed: Additional studies/ records that were reviewed today include: Labs Review of the above records demonstrates:  Please see elsewhere in the note.     ASSESSMENT AND PLAN:  PULMONARY EMBOLISM:   He is on lifelong anticoagulation with a factor V Leiden deficiency.  He tolerates anticoagulation.    DM:  A1c was down to 7.8 from 8.6 and he is followed by endocrinology   DYSLIPIDEMIA:   His LDL was 52.  Continue with meds as listed.   CVA HISTORY:   No change in therapy   CKD IIIA:  His creat was 1.3.  CAD:   The patient has no new sypmtoms.  No further cardiovascular testing is indicated.  We will continue with aggressive risk reduction and meds as listed.  BIFASICULAR BLOCK: He tolerates this and has had no syncope.  We talked about this.  He would let me know if he has any bradycardia arrhythmic symptoms  SLEEP APNEA:   He has not been able to use CPAP.  Current medicines are reviewed at length with the patient today.  The patient does not have concerns regarding medicines.  The following changes have been made: None  Labs/ tests ordered today include:  None  Orders Placed This Encounter  Procedures   EKG 12-Lead     Disposition:   FU with in 12 months.     Signed, Minus Breeding, MD  03/23/2022 9:28 AM    [facility]

## 2022-03-23 ENCOUNTER — Encounter: Payer: Self-pay | Admitting: Cardiology

## 2022-03-23 ENCOUNTER — Ambulatory Visit: Payer: Medicare Other | Attending: Cardiology | Admitting: Cardiology

## 2022-03-23 VITALS — BP 124/60 | HR 72 | Ht 72.0 in | Wt 166.0 lb

## 2022-03-23 DIAGNOSIS — G4730 Sleep apnea, unspecified: Secondary | ICD-10-CM | POA: Diagnosis not present

## 2022-03-23 DIAGNOSIS — N1831 Chronic kidney disease, stage 3a: Secondary | ICD-10-CM

## 2022-03-23 DIAGNOSIS — I2699 Other pulmonary embolism without acute cor pulmonale: Secondary | ICD-10-CM | POA: Diagnosis not present

## 2022-03-23 DIAGNOSIS — I6330 Cerebral infarction due to thrombosis of unspecified cerebral artery: Secondary | ICD-10-CM

## 2022-03-23 DIAGNOSIS — E785 Hyperlipidemia, unspecified: Secondary | ICD-10-CM | POA: Diagnosis not present

## 2022-03-23 DIAGNOSIS — I82432 Acute embolism and thrombosis of left popliteal vein: Secondary | ICD-10-CM

## 2022-03-23 MED ORDER — APIXABAN 2.5 MG PO TABS: 2.5000 mg | ORAL_TABLET | Freq: Two times a day (BID) | ORAL | 3 refills | Status: DC

## 2022-03-23 NOTE — Patient Instructions (Signed)
Medication Instructions:  Your physician recommends that you continue on your current medications as directed. Please refer to the Current Medication list given to you today.  *If you need a refill on your cardiac medications before your next appointment, please call your pharmacy*   Lab Work: NONE If you have labs (blood work) drawn today and your tests are completely normal, you will receive your results only by: MyChart Message (if you have MyChart) OR A paper copy in the mail If you have any lab test that is abnormal or we need to change your treatment, we will call you to review the results.   Testing/Procedures: NONE   Follow-Up: At [facility], you and your health needs are our priority.  As part of our continuing mission to provide you with exceptional heart care, we have created designated Provider Care Teams.  These Care Teams include your primary Cardiologist (physician) and Advanced Practice Providers (APPs -  Physician Assistants and Nurse Practitioners) who all work together to provide you with the care you need, when you need it.  We recommend signing up for the patient portal called "MyChart".  Sign up information is provided on this After Visit Summary.  MyChart is used to connect with patients for Virtual Visits (Telemedicine).  Patients are able to view lab/test results, encounter notes, upcoming appointments, etc.  Non-urgent messages can be sent to your provider as well.   To learn more about what you can do with MyChart, go to [email or web address].    Your next appointment:   1 year(s)  Provider:   James Hochrein, MD    

## 2022-04-01 ENCOUNTER — Other Ambulatory Visit: Payer: Self-pay | Admitting: Cardiology

## 2022-09-13 ENCOUNTER — Other Ambulatory Visit: Payer: Self-pay | Admitting: Orthopedic Surgery

## 2022-09-13 DIAGNOSIS — M48062 Spinal stenosis, lumbar region with neurogenic claudication: Secondary | ICD-10-CM

## 2022-09-13 DIAGNOSIS — M4807 Spinal stenosis, lumbosacral region: Secondary | ICD-10-CM

## 2022-09-20 ENCOUNTER — Ambulatory Visit
Admission: RE | Admit: 2022-09-20 | Discharge: 2022-09-20 | Disposition: A | Discharge status: Home / Self Care | Payer: Medicare Other | Source: Ambulatory Visit | Attending: Orthopedic Surgery | Admitting: Orthopedic Surgery

## 2022-09-20 DIAGNOSIS — M48062 Spinal stenosis, lumbar region with neurogenic claudication: Secondary | ICD-10-CM | POA: Diagnosis present

## 2022-09-20 DIAGNOSIS — M4807 Spinal stenosis, lumbosacral region: Secondary | ICD-10-CM | POA: Insufficient documentation

## 2023-03-26 ENCOUNTER — Other Ambulatory Visit: Payer: Self-pay | Admitting: Cardiology

## 2023-03-26 DIAGNOSIS — I6330 Cerebral infarction due to thrombosis of unspecified cerebral artery: Secondary | ICD-10-CM

## 2023-03-26 DIAGNOSIS — I82432 Acute embolism and thrombosis of left popliteal vein: Secondary | ICD-10-CM

## 2023-03-26 NOTE — Telephone Encounter (Signed)
Prescription refill request for Eliquis received. Indication: PE Last office visit: 03/23/22  Daiva Nakayama MD Scr: 1.2 on 03/05/23  Epic Age: 78 Weight: 75.3kg  Based on above findings Eliquis 2.5mg  twice daily is the appropriate dose for DVT.  Refill approved.  Pt is past due for appt with Dr Antoine Poche.  Message sent to schedulers.

## 2023-06-22 ENCOUNTER — Other Ambulatory Visit: Payer: Self-pay | Admitting: Cardiology

## 2023-06-22 DIAGNOSIS — I6330 Cerebral infarction due to thrombosis of unspecified cerebral artery: Secondary | ICD-10-CM

## 2023-06-22 DIAGNOSIS — I82432 Acute embolism and thrombosis of left popliteal vein: Secondary | ICD-10-CM

## 2023-06-24 NOTE — Telephone Encounter (Signed)
 Prescription refill request for Eliquis  received. Indication: PE Last office visit: 03/23/22  Arlene Lacy MD Scr: 1.2 on 03/05/23  Epic Age: 78 Weight: 75.3kg  Based on above findings Eliquis  5mg  twice daily is the appropriate dose.  Pt is past due for appt with Dr Lavonne Prairie.  Message sent to schedulers. Refill approved x 1.

## 2023-07-11 ENCOUNTER — Other Ambulatory Visit: Payer: Self-pay | Admitting: Cardiology

## 2023-07-11 DIAGNOSIS — I82432 Acute embolism and thrombosis of left popliteal vein: Secondary | ICD-10-CM

## 2023-07-11 DIAGNOSIS — I6330 Cerebral infarction due to thrombosis of unspecified cerebral artery: Secondary | ICD-10-CM

## 2023-07-11 NOTE — Telephone Encounter (Signed)
 Prescription refill request for Eliquis  received. Indication:DVT  Last office visit: 03/23/2022 Scr: 1.1, 06/10/2023 Age: 78 yo  Weight: 75.3 kg    Scheduled to see Dr. Lavonne Prairie on 08/13/2023

## 2023-08-11 NOTE — Progress Notes (Unsigned)
 Cardiology Office Note:   Date:  08/14/2023  ID:  Grant Sparks, DOB 12/14/1945, MRN [id number] PCP: Jimmy Moulding, MD  [facility] Providers Cardiologist:  Eilleen Grates, MD {  History of Present Illness:   Grant Sparks is a 78 y.o. 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    Since I last saw him he has had sudden onset of not being able to eat.  Is not that he is lost his appetite.  Just does not really want to eat.  He looks like he is lost about 6 pounds.  He is very weak.  This has been going on for about 2 weeks.  He is not describing palpitations, presyncope or syncope.  He has not been having any chest pressure, neck or arm discomfort.  He is not having any new shortness of breath.  He is denying PND or orthopnea.  She has had no change in bowel or bladder.  He has had no cough fevers or chills.  ROS: As stated in the HPI and negative for all other systems.  Studies Reviewed:    EKG:   EKG Interpretation Date/Time:  Tuesday August 13 2023 16:03:13 EDT Ventricular Rate:  68 PR Interval:  152 QRS Duration:  138 QT Interval:  432 QTC Calculation: 459 R Axis:   -66  Text Interpretation: Normal sinus rhythm Right bundle branch block Left anterior fascicular block Bifascicular block Minimal voltage criteria for LVH, may be normal variant ( R in aVL ) When compared with ECG of 22-Oct-2019 08:20, No significant change since last tracing Confirmed by Eilleen Grates ([id number]) on 08/13/2023 4:25:39 PM    Risk Assessment/Calculations:             Physical Exam:   VS:  BP (!) 104/59   Pulse 67   Ht 6' (1.829 m)   Wt 160 lb (72.6 kg)   SpO2 95%   BMI 21.70 kg/m    Wt Readings from Last 3 Encounters:  08/13/23 160 lb (72.6 kg)  03/23/22 166 lb (75.3 kg)  03/28/21 172 lb 6.4 oz (78.2 kg)     GEN: Well nourished, well developed in no acute  distress NECK: No JVD; No carotid bruits CARDIAC: RRR, no murmurs, rubs, gallops RESPIRATORY:  Clear to auscultation without rales, wheezing or rhonchi  ABDOMEN: Soft, non-tender, non-distended EXTREMITIES:  No edema; No deformity   ASSESSMENT AND PLAN:   PULMONARY EMBOLISM:   He is on lifelong anticoagulation.  No change in therapy..     DM: This is managed per his primary provider.  He has had a recent change in his diabetes medicines and this could be related to his decreased ability to eat not I asked him to check with his primary provider.  DYSLIPIDEMIA:   His LDL was was at target.  No change in therapy.   CVA HISTORY:   No change in therapy.  CKD IIIA:  His creat was 1.1.  It had been higher previously.  No change in therapy.   CAD: I do not think any of his symptoms are an ischemic etiology.  No change in therapy.  No ischemia workup.  BIFASICULAR BLOCK:   I am going to apply a 3-day monitor just to make sure he does not have any significant bradycardia arrhythmias.   SLEEP APNEA:   He has previously not  been able to use CPAP.  This could cause fatigue but I do not think is causing any decreased appetite.  Follow up with me based on the results of the above testing and whether there is any further questions in the future about whether any of his symptoms could be cardiac.  He is very low likelihood could have something like heart failure but I would keep this in mind.  Signed, Eilleen Grates, MD

## 2023-08-13 ENCOUNTER — Ambulatory Visit: Payer: Self-pay | Attending: Cardiology | Admitting: Cardiology

## 2023-08-13 ENCOUNTER — Encounter: Payer: Self-pay | Admitting: Cardiology

## 2023-08-13 ENCOUNTER — Ambulatory Visit

## 2023-08-13 VITALS — BP 104/59 | HR 67 | Ht 72.0 in | Wt 160.0 lb

## 2023-08-13 DIAGNOSIS — I2699 Other pulmonary embolism without acute cor pulmonale: Secondary | ICD-10-CM

## 2023-08-13 DIAGNOSIS — R001 Bradycardia, unspecified: Secondary | ICD-10-CM

## 2023-08-13 DIAGNOSIS — E118 Type 2 diabetes mellitus with unspecified complications: Secondary | ICD-10-CM | POA: Diagnosis not present

## 2023-08-13 DIAGNOSIS — E785 Hyperlipidemia, unspecified: Secondary | ICD-10-CM | POA: Diagnosis not present

## 2023-08-13 DIAGNOSIS — I2510 Atherosclerotic heart disease of native coronary artery without angina pectoris: Secondary | ICD-10-CM | POA: Diagnosis not present

## 2023-08-13 NOTE — Patient Instructions (Signed)
 Medication Instructions:  Your physician recommends that you continue on your current medications as directed. Please refer to the Current Medication list given to you today.  *If you need a refill on your cardiac medications before your next appointment, please call your pharmacy*  Lab Work: NONE If you have labs (blood work) drawn today and your tests are completely normal, you will receive your results only by: MyChart Message (if you have MyChart) OR A paper copy in the mail If you have any lab test that is abnormal or we need to change your treatment, we will call you to review the results.  Testing/Procedures: 3 Day Zio Heart Monitor Your physician has requested that you wear a Zio heart monitor for 3 days. This will be mailed to your home with instructions on how to apply the monitor and how to return it when finished. Please allow 2 weeks after returning the heart monitor before our office calls you with the results.   Follow-Up: At [facility], you and your health needs are our priority.  As part of our continuing mission to provide you with exceptional heart care, our providers are all part of one team.  This team includes your primary Cardiologist (physician) and Advanced Practice Providers or APPs (Physician Assistants and Nurse Practitioners) who all work together to provide you with the care you need, when you need it.  Your next appointment:   6 months  Provider:   Lavonne Prairie, MD  We recommend signing up for the patient portal called MyChart.  Sign up information is provided on this After Visit Summary.  MyChart is used to connect with patients for Virtual Visits (Telemedicine).  Patients are able to view lab/test results, encounter notes, upcoming appointments, etc.  Non-urgent messages can be sent to your provider as well.   To learn more about what you can do with MyChart, go to [email or web address].   Other Instructions ZIO XT- Long Term Monitor  Instructions  Your physician has requested you wear a ZIO patch monitor for 14 days.  This is a single patch monitor. [employer] supplies one patch monitor per enrollment. Additional stickers are not available. Please do not apply patch if you will be having a Nuclear Stress Test,  Echocardiogram, Cardiac CT, MRI, or Chest Xray during the period you would be wearing the  monitor. The patch cannot be worn during these tests. You cannot remove and re-apply the  ZIO XT patch monitor.  Your ZIO patch monitor will be mailed 3 day USPS to your address on file. It may take 3-5 days  to receive your monitor after you have been enrolled.  Once you have received your monitor, please review the enclosed instructions. Your monitor  has already been registered assigning a specific monitor serial # to you.  Billing and Patient Assistance Program Information  We have supplied [employer] with any of your insurance information on file for billing purposes. [employer] offers a sliding scale Patient Assistance Program for patients that do not have  insurance, or whose insurance does not completely cover the cost of the ZIO monitor.  You must apply for the Patient Assistance Program to qualify for this discounted rate.  To apply, please call [employer] at [phone], select option 4, select option 2, ask to apply for  Patient Assistance Program. Sanna Crystal will ask your household income, and how many people  are in your household. They will quote your out-of-pocket cost based on that information.  [employer] will also be able to set  up a 10-month, interest-free payment plan if needed.  Applying the monitor   Shave hair from upper left chest.  Hold abrader disc by orange tab. Rub abrader in 40 strokes over the upper left chest as  indicated in your monitor instructions.  Clean area with 4 enclosed alcohol pads. Let dry.  Apply patch as indicated in monitor instructions. Patch will be placed under collarbone on left  side  of chest with arrow pointing upward.  Rub patch adhesive wings for 2 minutes. Remove white label marked 1. Remove the white  label marked 2. Rub patch adhesive wings for 2 additional minutes.  While looking in a mirror, press and release button in center of patch. A small green light will  flash 3-4 times. This will be your only indicator that the monitor has been turned on.  Do not shower for the first 24 hours. You may shower after the first 24 hours.  Press the button if you feel a symptom. You will hear a small click. Record Date, Time and  Symptom in the Patient Logbook.  When you are ready to remove the patch, follow instructions on the last 2 pages of Patient  Logbook. Stick patch monitor onto the last page of Patient Logbook.  Place Patient Logbook in the blue and white box. Use locking tab on box and tape box closed  securely. The blue and white box has prepaid postage on it. Please place it in the mailbox as  soon as possible. Your physician should have your test results approximately 7 days after the  monitor has been mailed back to [facility].  Call [facility] Customer Care at [phone] if you have questions regarding  your ZIO XT patch monitor. Call them immediately if you see an orange light blinking on your  monitor.  If your monitor falls off in less than 4 days, contact our Monitor department at [phone].  If your monitor becomes loose or falls off after 4 days call [employer] at [phone] for  suggestions on securing your monitor

## 2023-08-13 NOTE — Progress Notes (Unsigned)
Applied a 3 day Zio XT monitor to patient in the office 

## 2023-08-14 ENCOUNTER — Encounter: Payer: Self-pay | Admitting: Cardiology

## 2023-08-21 ENCOUNTER — Other Ambulatory Visit: Payer: Self-pay | Admitting: Cardiology

## 2023-08-21 DIAGNOSIS — I82432 Acute embolism and thrombosis of left popliteal vein: Secondary | ICD-10-CM

## 2023-08-21 DIAGNOSIS — I6330 Cerebral infarction due to thrombosis of unspecified cerebral artery: Secondary | ICD-10-CM

## 2023-08-21 NOTE — Telephone Encounter (Signed)
 Prescription refill request for Eliquis  received. Indication:pe Last office visit:6/25 Scr:1.2  6/25 Age: 78 Weight:72.6  kg  Prescription refilled

## 2023-08-30 ENCOUNTER — Ambulatory Visit: Payer: Self-pay | Admitting: Cardiology

## 2023-08-30 DIAGNOSIS — R001 Bradycardia, unspecified: Secondary | ICD-10-CM | POA: Diagnosis not present

## 2023-09-03 NOTE — Telephone Encounter (Signed)
 Wife Versa) returned RN's call regarding results.

## 2024-02-15 ENCOUNTER — Other Ambulatory Visit: Payer: Self-pay | Admitting: Cardiology

## 2024-02-15 DIAGNOSIS — I82432 Acute embolism and thrombosis of left popliteal vein: Secondary | ICD-10-CM

## 2024-02-15 DIAGNOSIS — I6330 Cerebral infarction due to thrombosis of unspecified cerebral artery: Secondary | ICD-10-CM
